# Patient Record
Sex: Female | Born: 1960
Health system: Southern US, Community
[De-identification: ages and names within clinical notes are randomized; demographics above are authoritative.]

## PROBLEM LIST (undated history)

## (undated) DIAGNOSIS — E042 Nontoxic multinodular goiter: Secondary | ICD-10-CM

## (undated) DIAGNOSIS — E785 Hyperlipidemia, unspecified: Secondary | ICD-10-CM

## (undated) DIAGNOSIS — G4700 Insomnia, unspecified: Secondary | ICD-10-CM

## (undated) DIAGNOSIS — F329 Major depressive disorder, single episode, unspecified: Secondary | ICD-10-CM

## (undated) DIAGNOSIS — F419 Anxiety disorder, unspecified: Secondary | ICD-10-CM

## (undated) DIAGNOSIS — B009 Herpesviral infection, unspecified: Secondary | ICD-10-CM

## (undated) DIAGNOSIS — F32A Depression, unspecified: Secondary | ICD-10-CM

## (undated) DIAGNOSIS — G4733 Obstructive sleep apnea (adult) (pediatric): Secondary | ICD-10-CM

## (undated) DIAGNOSIS — J40 Bronchitis, not specified as acute or chronic: Secondary | ICD-10-CM

## (undated) DIAGNOSIS — J45909 Unspecified asthma, uncomplicated: Secondary | ICD-10-CM

## (undated) DIAGNOSIS — M7710 Lateral epicondylitis, unspecified elbow: Secondary | ICD-10-CM

## (undated) DIAGNOSIS — M7060 Trochanteric bursitis, unspecified hip: Secondary | ICD-10-CM

## (undated) DIAGNOSIS — N898 Other specified noninflammatory disorders of vagina: Secondary | ICD-10-CM

## (undated) HISTORY — DX: Major depressive disorder, single episode, unspecified: F32.9

## (undated) HISTORY — DX: Insomnia, unspecified: G47.00

## (undated) HISTORY — DX: Nontoxic multinodular goiter: E04.2

## (undated) HISTORY — DX: Depression, unspecified: F32.A

## (undated) HISTORY — DX: Hyperlipidemia, unspecified: E78.5

## (undated) HISTORY — PX: HYSTEROTOMY: SHX1776

## (undated) HISTORY — DX: Trochanteric bursitis, unspecified hip: M70.60

## (undated) HISTORY — DX: Anxiety disorder, unspecified: F41.9

## (undated) HISTORY — DX: Unspecified asthma, uncomplicated: J45.909

## (undated) HISTORY — PX: KNEE SURGERY: SHX244

## (undated) HISTORY — DX: Bronchitis, not specified as acute or chronic: J40

## (undated) HISTORY — DX: Lateral epicondylitis, unspecified elbow: M77.10

## (undated) HISTORY — DX: Obstructive sleep apnea (adult) (pediatric): G47.33

## (undated) HISTORY — PX: HERNIA REPAIR: SHX51

## (undated) HISTORY — DX: Herpesviral infection, unspecified: B00.9

## (undated) HISTORY — PX: DILATION AND CURETTAGE OF UTERUS: SHX78

## (undated) HISTORY — DX: Other specified noninflammatory disorders of vagina: N89.8

## (undated) HISTORY — PX: CRYOTHERAPY: SHX1416

---

## 1997-11-04 ENCOUNTER — Other Ambulatory Visit: Admission: RE | Admit: 1997-11-04 | Discharge: 1997-11-04 | Payer: Self-pay | Admitting: Obstetrics and Gynecology

## 1997-11-27 ENCOUNTER — Inpatient Hospital Stay (HOSPITAL_COMMUNITY): Admission: AD | Admit: 1997-11-27 | Discharge: 1997-11-29 | Payer: Self-pay | Admitting: Obstetrics and Gynecology

## 1998-12-31 ENCOUNTER — Other Ambulatory Visit: Admission: RE | Admit: 1998-12-31 | Discharge: 1998-12-31 | Payer: Self-pay | Admitting: *Deleted

## 1999-04-22 ENCOUNTER — Other Ambulatory Visit: Admission: RE | Admit: 1999-04-22 | Discharge: 1999-04-22 | Payer: Self-pay | Admitting: Obstetrics and Gynecology

## 1999-12-24 ENCOUNTER — Other Ambulatory Visit: Admission: RE | Admit: 1999-12-24 | Discharge: 1999-12-24 | Payer: Self-pay | Admitting: Obstetrics and Gynecology

## 2000-12-07 ENCOUNTER — Encounter: Admission: RE | Admit: 2000-12-07 | Discharge: 2001-03-07 | Payer: Self-pay | Admitting: Family Medicine

## 2002-04-02 ENCOUNTER — Other Ambulatory Visit: Admission: RE | Admit: 2002-04-02 | Discharge: 2002-04-02 | Payer: Self-pay | Admitting: Family Medicine

## 2003-04-12 ENCOUNTER — Other Ambulatory Visit: Admission: RE | Admit: 2003-04-12 | Discharge: 2003-04-12 | Payer: Self-pay | Admitting: Family Medicine

## 2004-06-10 ENCOUNTER — Other Ambulatory Visit: Admission: RE | Admit: 2004-06-10 | Discharge: 2004-06-10 | Payer: Self-pay | Admitting: Family Medicine

## 2005-07-16 ENCOUNTER — Other Ambulatory Visit: Admission: RE | Admit: 2005-07-16 | Discharge: 2005-07-16 | Payer: Self-pay | Admitting: Family Medicine

## 2006-07-05 HISTORY — PX: COMBINED HYSTERECTOMY VAGINAL / OOPHORECTOMY / A&P REPAIR: SUR294

## 2006-09-21 ENCOUNTER — Encounter (INDEPENDENT_AMBULATORY_CARE_PROVIDER_SITE_OTHER): Payer: Self-pay | Admitting: Specialist

## 2006-09-21 ENCOUNTER — Inpatient Hospital Stay (HOSPITAL_COMMUNITY): Admission: RE | Admit: 2006-09-21 | Discharge: 2006-09-22 | Payer: Self-pay | Admitting: Obstetrics and Gynecology

## 2008-08-06 ENCOUNTER — Other Ambulatory Visit: Admission: RE | Admit: 2008-08-06 | Discharge: 2008-08-06 | Payer: Self-pay | Admitting: Family Medicine

## 2009-11-14 ENCOUNTER — Encounter: Admission: RE | Admit: 2009-11-14 | Discharge: 2009-11-14 | Payer: Self-pay | Admitting: Family Medicine

## 2010-01-27 ENCOUNTER — Other Ambulatory Visit: Admission: RE | Admit: 2010-01-27 | Discharge: 2010-01-27 | Payer: Self-pay | Admitting: Interventional Radiology

## 2010-01-27 ENCOUNTER — Encounter: Admission: RE | Admit: 2010-01-27 | Discharge: 2010-01-27 | Payer: Self-pay | Admitting: Internal Medicine

## 2010-11-20 NOTE — H&P (Signed)
NAMECAROLANNE, MERCIER                  ACCOUNT NO.:  [id number]   MEDICAL RECORD NO.:  [id number]          PATIENT TYPE:  AMB   LOCATION:  SDC                           [facility]:  [facility]   PHYSICIAN:  Dois Davenport A. Rivard, M.D. DATE OF BIRTH:  [birth year]   DATE OF ADMISSION:  DATE OF DISCHARGE:                              HISTORY & PHYSICAL   HISTORY OF PRESENT ILLNESS:  Debra Hardin is a 50 year old married white  female para 2-0-3-2 who presents for a total vaginal hysterectomy with  anterior/posterior repair because of dysfunctional uterine bleeding and  symptomatic pelvic relaxation.  For several years the patient reports  heavy, irregular menstrual periods accompanied by clots.  More recently,  she bled for 28 days though she only required one pad daily.  There have  been times, however, in the past 6 months where she has soiled her bed  linen even while wearing two tampons plus a pad.  She denies severe  cramping, nausea, vomiting, diarrhea, urinary tract symptoms, or  vaginitis symptoms.  She does admit to urinary incontinence with and  without coughing or sneezing.  Additionally, the patient has noticed a  bulge from her vaginal area during her bathroom visits.  The patient has  tried combination as well as progestin-only oral contraceptives to  manage her bleeding; however, neither have been successful and the  combination therapy worsened her migraines.  An endometrial biopsy in  February 2008 showed benign proliferative endometrium without  hyperplasia or malignancy.  Her CBC, TSH, and prolactin were within  normal limits, and her FSH was nonmenopausal.  A pelvic ultrasound  February 2008 revealed a uterus measuring 8.9 cm x 5.4 cm x 5.7 cm with  a 1.9-cm anterior fibroid and normal-appearing ovaries.  In light of her  failure of hormonal therapy and the protracted nature of her symptoms,  the patient has decided to proceed with surgical management in the form  of hysterectomy and  anterior/posterior repair.   PAST MEDICAL HISTORY:  OB history:  Gravida 5, para 2-0-3-2.  The  patient does have a history of infertility.   GYN history:  Menarche 50 years old.  Last menstrual period February  2008 (see history of present illness).  The patient uses vasectomy as  her method of contraception.  She has a history of cervical dysplasia  which was treated with cryotherapy in 2000.  She does have a history of  herpes simplex virus #2.  Her last normal Pap smear was January 2007.  Last normal mammogram was September 2007.   Medical history:  Asthma, pyelonephritis, neck injury, anemia,  arthritis, migraines, and depression.   Surgical history:  In 1967, hernia repair.  In 1979, pregnancy term.  In  1987, right knee surgery.  In 1997, D&C.   FAMILY HISTORY:  Cardiovascular disease, hypertension, thrombophlebitis,  stroke.   HABITS:  She does not use tobacco and she uses alcohol occasionally.   SOCIAL HISTORY:  The patient is married and she works as a Veterinary surgeon for  [employer].   CURRENT MEDICATIONS:  1. Singulair 10 mg  daily.  2. Zyrtec 10 mg daily.  3. Lexapro 20 mg daily.  4. Limbrel one tablet daily.  5. Xanax 0.25 mg at bedtime as needed.  6. ProAir two puffs as needed.  7. Advair 100/50 two puffs as needed.  8. Valtrex 500 mg twice daily for 3 days as needed.   The patient has a sensitivity to CODEINE - it causes severe nausea.   REVIEW OF SYSTEMS:  The patient wears glasses.  She has seasonal  allergies which contributes to coughing and sneezing.  She denies any  chest pain, vision changes, changes in her bowel habits, vaginitis  symptoms, urinary tract symptoms, and except as mentioned in history of  present illness, the patient's review of systems is otherwise negative.   PHYSICAL EXAMINATION:  VITAL SIGNS:  Blood pressure 110/60, weight is  174, height is 5 feet 7 inches.  NECK:  Supple.  There are no masses, thyromegaly, or cervical   adenopathy.  HEART:  Regular rate and rhythm.  There is no murmur.  LUNGS:  Clear.  There are no wheezes, rales, or rhonchi.  BACK:  No CVA tenderness.  ABDOMEN:  Without tenderness, masses or organomegaly.  EXTREMITIES:  Without clubbing, cyanosis, or edema.  PELVIC:  EG/BUS is normal.  Vagina reveals a 2/4 cystocele, 3/4  rectocele, a 3/4 urethrocele.  There is no enterocele and negative  stress.  Cervix is without tenderness or lesions.  Uterus is retroverted  but normal size, shape and consistency without tenderness.  Adnexa:  No  tenderness or masses.  Rectovaginal without tenderness or masses.   IMPRESSION:  1. Dysfunctional uterine bleeding.  2. Symptomatic pelvic relaxation.  3. Cystocele.  4. Rectocele.   DISPOSITION:  A discussion was held with the patient regarding the  indications for her procedures along with their risks, which include but  are not limited to reaction to anesthesia, damage to adjacent organs,  infection, excessive bleeding, and the possibility that her surgery may  require an abdominal incision.  The patient further understands that  there may be some difficulty with urination following surgery.  The  patient was given a copy of the ACOG brochure on hysterectomy.  She has  consented to proceed with a total vaginal hysterectomy with  anterior/posterior repair at [facility] on September 21, 2006, at 7:30 a.m.      Elmira J. Adline Peals.      Crist Fat Rivard, M.D.  Electronically Signed    EJP/MEDQ  D:  09/16/2006  T:  09/16/2006  Job:  [id number]

## 2010-11-20 NOTE — Discharge Summary (Signed)
Debra Hardin                  ACCOUNT NO.:  [id number]   MEDICAL RECORD NO.:  [id number]          PATIENT TYPE:  INP   LOCATION:  9307                          FACILITY:  [facility]   PHYSICIAN:  Crist Fat. Rivard, M.D. DATE OF BIRTH:  May 09, 1961   DATE OF ADMISSION:  09/21/2006  DATE OF DISCHARGE:  09/22/2006                               DISCHARGE SUMMARY   DISCHARGE DIAGNOSES:  1. Dysfunctional uterine bleeding.  2. Symptomatic pelvic relaxation.  3. Cystocele.  4. Rectocele.  5. Right ovarian cyst.   OPERATION:  On the day of admission the patient underwent a total  vaginal hysterectomy with anterior posterior repair and drainage of a  right ovarian cyst, tolerating procedure well.   HISTORY OF PRESENT ILLNESS:  Debra Hardin is a 50 year old married white  female para 2-0-3-2 who presents for total vaginal hysterectomy with  anterior-posterior repair because of dysfunctional uterine bleeding and  symptomatic pelvic relaxation.  Please see the patient's dictated  history and physical examination for details.   PREOPERATIVE PHYSICAL EXAM:  VITAL SIGNS:  Blood pressure 110/60, weight  is 174, height is 5 feet 7 inches tall  GENERAL:  Exam is within normal limits.  PELVIC:  EG BUS is within normal limits.  Vagina reveals a 2/4  cystocele, 3/4 rectocele, and 3/4 urethrocele. There is no enterocele  and negative stress. Cervix is without tenderness or lesions.  Uterus is  retroverted but normal size, shape and consistency without tenderness.  Adnexa no tenderness or masses.  Rectovaginal without tenderness or  masses.   HOSPITAL COURSE:  On the date of admission the patient underwent  aforementioned procedure tolerating it well.  (Uterus and cervix,  weighed 132.6 grams).  Postoperative course was unremarkable with  patient resuming bowel and bladder function by postop day #1 with  minimal postvoid residual and therefore deemed ready for discharge home.   DISCHARGE LABS:  CBC;  white blood count 10.3, hemoglobin 10.8  (preoperatively 12.7), hematocrit 31.8, platelets 176.  Basic metabolic  panel; sodium 136, potassium 4.0, chloride 102, CO2 30, glucose 88, BUN  11, creatinine 0.83, calcium 8.6.   DISCHARGE MEDICATIONS:  The patient was directed to her home medication  reconciliation form.  1. Vicodin 1-2 tablets every 4 hours as needed for pain.  2. Colace 100 mg up to twice daily to prevent constipation.  3. Ibuprofen 600 mg every 6 hours for 3 days then as needed for pain.  4. Lexapro 20 mg daily.  5. Xanax 1/2 to 1 tablet three times daily as needed.  6. Valtrex 1000 mg as needed.  7. Albuterol inhaler as needed.  8. Advair 100/50 as directed.  9. Singulair 10 mg daily.  10.Zyrtec 10 mg daily.   FOLLOW UP:  The patient is scheduled for 6 weeks postoperative visit  with Dr. Estanislado Pandy on October 16, 2006 at 11 o'clock a.m.   DISCHARGE INSTRUCTIONS:  The patient was given a copy of [facility] postoperative instruction sheet.  She was further  advised to avoid driving for 2 weeks, heavy lifting for 4 weeks,  intercourse for 6 weeks, that she may walk up steps, that she may  shower.  She should increase her activities slowly and diet is without  restriction though the patient was encouraged to increase her protein  and vitamin C intake.   FINAL PATHOLOGY:  Uterus hysterectomy:  Benign cervix with squamous  metaplasia, benign proliferative endometrium, no hyperplasia identified,  adenomyosis, and unremarkable serosa.      Elmira J. Adline Peals.      Crist Fat Rivard, M.D.  Electronically Signed    EJP/MEDQ  D:  10/18/2006  T:  10/18/2006  Job:  [id number]

## 2010-11-20 NOTE — Op Note (Signed)
Debra Hardin, Debra Hardin                  ACCOUNT NO.:  [id number]   MEDICAL RECORD NO.:  [id number]          PATIENT TYPE:  AMB   LOCATION:  SDC                           [facility]  [facility]   PHYSICIAN:  Dois Davenport A. Hardin, M.D. DATE OF BIRTH:  [birth year]   DATE OF PROCEDURE:  09/21/2006  DATE OF DISCHARGE:                               OPERATIVE REPORT   PREOPERATIVE DIAGNOSIS:  Dysfunctional uterine bleeding with symptomatic  pelvic relaxation.   POSTOPERATIVE DIAGNOSIS:  Dysfunctional uterine bleeding with  symptomatic pelvic relaxation, with small right ovarian cyst.   ANESTHESIA:  General.   PROCEDURE:  Total vaginal hysterectomy with anterior and posterior  repair and drainage of right ovarian cyst.   SURGEON:  Dr. Estanislado Pandy.   ASSISTANT:  Henreitta Leber, physician's assistance.   ESTIMATED BLOOD LOSS:  100 mL.   PROCEDURE:  After being informed of the planned procedure with possible  complications including bleeding, infection, injury to bowel, bladder or  ureters and need for laparotomy, informed consent is obtained.  The  patient is taken to OR #8, given general anesthesia with endotracheal  intubation without complication.  She is placed in the lithotomy  position, prepped and draped in a sterile fashion, and a Foley catheter  is inserted in her bladder.  Pelvic exam reveals a retroverted normal-  sized uterus, 2 normal adnexa, a cystocele 2/4, rectocele 3/4.  A  weighted speculum is inserted.  Anterior lip of the cervix is grasped  with a Jacobs forceps, and we infiltrate the vaginal mucosa in a  circumferential way around the cervix using lidocaine 1% epinephrine  1:200.  This allows [place] to open the vaginal mucosa in a circular way and  to bluntly dissect the posterior and anterior cul-de-sac.  Posterior cul-  de-sac is identified and opened with scissors, and retractors are placed  in the posterior cul-de-sac.  This allows [place] to isolate uterosacral  ligaments on each side with  Rogers forceps, section those ligaments and  suture them with a transfix suture of 0 Vicryl.  Anterior cul-de-sac is  then identified.  Peritoneum is opened sharply which allows [place] to fully  retract bladder anteriorly.  We can now isolate and clamp uterine  arteries on both sides with Rogers forceps, section those pedicles and  suture them with 0 Vicryl.  The uterus is then delivered via the  posterior cul-de-sac, fundus is grasped, and this allows [place] to isolate  the final pedicle on each side containing broad ligament, round  ligament, tubo-ovarian ligament and tubes.  This was done with 2 Rogers  forceps on each side.  Pedicles are sectioned.  Uterus is removed  entirely.  Both pedicles are doubly sutured with transfix suture of 0  Vicryl kept for future suspension.  Hemostasis is verified and completed  on the right uterosacral ligament with a figure-of-eight stitch of 0  Vicryl.  Both ovaries are identified, visualized and the right ovary has  a small 1.5-cm ovarian cyst which is drained after puncturing it with  cautery.  Unfortunately, this leads to bleeding, which requires 2 figure-  of-eight stitch of 2-0 Vicryl as well as cauterization.  We wait for a  few minutes to verify hemostasis which is now adequate.  A suspension  suture as well as a culdoplasty suture is placed with a 0 Vicryl  reuniting posterior cul-de-sac, posterior peritoneum. left uterosacral,  posterior peritoneum, right uterosacral, posterior cul-de-sac.  Using  the previously placed uterosacral suture and a free needle, this stitch  is used to tie each of the vaginal angle.  Hemostasis is rechecked and  adequate.  The perineum is closed with a figure-of-eight stitch of 0  Vicryl, and we can now proceed with anterior repair.  The anterior  vaginal mucosa is grasped with Allis forceps and infiltrated with  lidocaine 1% epinephrine 1:200 in the midline on its midline route.  We  can then incise the anterior vaginal  mucosa anteriorly, and then we  sharply and bluntly dissect the prevesical fascia away from the anterior  vaginal wall.  This allows [place] to completely correct the cystocele.  Using 2-0 Vicryl, we can then place a Kelly-Kennedy suspension stitch  underneath the urethrovesical junction which corrects the urethrocele,  and then we plicate in two layers using U stitches of 2-0 Vicryl the  cystocele until full correction.  The excess anterior vaginal mucosa is  then excised, and this mucosa is closed with figure-of-eight stitches of  3-0 Vicryl.  We then closed the vaginal vault using figure-of-eight  stitches of 0 Vicryl, and we then tied our suspension culdoplasty  stitch.  Retractors are removed, and posterior perineal skin is grasped  with Allis forceps on the distance of 1.5 cm.  Perineum is infiltrated  with lidocaine 1% epinephrine 1:200, and posterior vaginal mucosa is  infiltrated using the same solution.  We identify the apex of where we  want to stop our posterior repair, and then we excise a loop of perineal  skin between our 2 Allis forceps.  This allows [place] to undermine the  posterior vaginal mucosa away from the prerectal fascia in a midline  fashion and then to sharply and bluntly dissect the rectum away from the  posterior vaginal mucosa.  We can then correct the rectocele using U  stitches of 2-0 Vicryl in 3 planes until complete correction.  Excess of  posterior vaginal mucosa is excised, and we closed that mucosa with a  running lock suture of 3-0 Vicryl.  Perineal muscles are reapproximated  with simple sutures of 3-0 Vicryl, and perineal skin is closed with  subcuticular suture of 3-0 Vicryl.   This gives [place] a complete correction of the cystocele, rectocele and  urethrocele and a very good remaining vaginal length.  The vagina is  then packed with a vaginal packing and Estrace cream.  Instruments and sponge count is complete x2.  Estimated blood loss is 100 mL.  The   procedure is very well tolerated by the patient who is taken to recovery  room in a well and stable condition.      Debra Hardin, M.D.  Electronically Signed     SAR/MEDQ  D:  09/21/2006  T:  09/21/2006  Job:  [id number]

## 2012-04-26 ENCOUNTER — Encounter: Payer: Self-pay | Admitting: Obstetrics and Gynecology

## 2012-04-26 ENCOUNTER — Ambulatory Visit (INDEPENDENT_AMBULATORY_CARE_PROVIDER_SITE_OTHER): Payer: 59 | Admitting: Obstetrics and Gynecology

## 2012-04-26 VITALS — BP 110/70 | Wt 173.0 lb

## 2012-04-26 DIAGNOSIS — R159 Full incontinence of feces: Secondary | ICD-10-CM

## 2012-04-26 DIAGNOSIS — N3090 Cystitis, unspecified without hematuria: Secondary | ICD-10-CM

## 2012-04-26 LAB — POCT URINALYSIS DIPSTICK
Bilirubin, UA: NEGATIVE
Ketones, UA: NEGATIVE
Spec Grav, UA: <=1.005
Urobilinogen, UA: NEGATIVE
pH, UA: 8

## 2012-04-26 NOTE — Progress Notes (Signed)
Subjective:    Debra Hardin is a 51 y.o. female, [id number], who presents for lose stool after using the restroom and keep wiping all day  c/o bladder issues . S/P TVH with AP repair 09/2006  Mammogram: had one 04/24/12 Colonoscopy: 2013 with Dr Loreta Ave, Normal  The following portions of the patient's history were reviewed and updated as appropriate: allergies, current medications, past family history.  Review of Systems Pertinent items are noted in HPI. Breast:Negative for breast lump,nipple discharge or nipple retraction Gastrointestinal: Bm daily, has to wipe over and over and still has stools in underwear 2-3 x a week, normal texture stool. Problem is ongoing for about 1 year and progressively worsening. Hemorrhoids + with messier when out. Urinary: USI if bladder is full with sneezing and laughing, normal frequency, no urgency, complete emptying, no nycturia, no dysuria, no hematuria   Objective:    BP 110/70  Wt 173 lb (78.472 kg)    Weight:  Wt Readings from Last 1 Encounters:  04/26/12 173 lb (78.472 kg)          BMI: There is no height on file to calculate BMI.  General Appearance: Alert, appropriate appearance for age. No acute distress HEENT: Grossly normal Neck / Thyroid: Supple, no masses, nodes or enlargement Lungs: clear to auscultation bilaterally Back: No CVA tenderness Cardiovascular: Regular rate and rhythm. S1, S2, no murmur Gastrointestinal: Soft, non-tender, no masses or organomegaly Pelvic Exam: Vulva and vagina appear normal. No cystocele or rectocele. Uretrocele 2/4. Rectovaginal: normal rectal, no masses. Good rectal sphincter tone No rectocele, but significant skin tag from hemorrhoid.    Assessment:    Stool incontinence 2nd to rectal skin tag?    Plan:    Add fiber daily to bulk up stools. If no improvement in 6 weeks, refer to surgery for removal of rectal skin tag +/- hemorrhoid surgery  Fiber supplement, daily. Benefiber recommended. Skin tag  removal, and possible hemorrhoid removal/"lift". Not necessary to address bladder issue at this time.  Silverio Lay MD

## 2013-07-23 ENCOUNTER — Telehealth: Payer: Self-pay | Admitting: *Deleted

## 2013-07-24 NOTE — Telephone Encounter (Signed)
LVM message to pt return call regarding appt change

## 2013-07-25 ENCOUNTER — Ambulatory Visit: Payer: Self-pay | Admitting: Internal Medicine

## 2013-07-25 ENCOUNTER — Encounter (INDEPENDENT_AMBULATORY_CARE_PROVIDER_SITE_OTHER): Payer: Self-pay

## 2013-07-25 ENCOUNTER — Encounter: Payer: Self-pay | Admitting: Internal Medicine

## 2013-07-25 ENCOUNTER — Ambulatory Visit (INDEPENDENT_AMBULATORY_CARE_PROVIDER_SITE_OTHER): Payer: Commercial Managed Care - PPO | Admitting: Internal Medicine

## 2013-07-25 VITALS — BP 120/77 | HR 72 | Temp 98.0°F | Resp 18 | Ht 66.5 in | Wt 183.0 lb

## 2013-07-25 DIAGNOSIS — M1990 Unspecified osteoarthritis, unspecified site: Secondary | ICD-10-CM

## 2013-07-25 DIAGNOSIS — J45909 Unspecified asthma, uncomplicated: Secondary | ICD-10-CM

## 2013-07-25 DIAGNOSIS — F32A Depression, unspecified: Secondary | ICD-10-CM

## 2013-07-25 DIAGNOSIS — R32 Unspecified urinary incontinence: Secondary | ICD-10-CM

## 2013-07-25 DIAGNOSIS — G4730 Sleep apnea, unspecified: Secondary | ICD-10-CM

## 2013-07-25 DIAGNOSIS — M7070 Other bursitis of hip, unspecified hip: Secondary | ICD-10-CM

## 2013-07-25 DIAGNOSIS — E042 Nontoxic multinodular goiter: Secondary | ICD-10-CM | POA: Insufficient documentation

## 2013-07-25 DIAGNOSIS — M25552 Pain in left hip: Secondary | ICD-10-CM | POA: Insufficient documentation

## 2013-07-25 DIAGNOSIS — F411 Generalized anxiety disorder: Secondary | ICD-10-CM

## 2013-07-25 DIAGNOSIS — R42 Dizziness and giddiness: Secondary | ICD-10-CM

## 2013-07-25 DIAGNOSIS — E785 Hyperlipidemia, unspecified: Secondary | ICD-10-CM | POA: Insufficient documentation

## 2013-07-25 DIAGNOSIS — F3289 Other specified depressive episodes: Secondary | ICD-10-CM

## 2013-07-25 DIAGNOSIS — M76899 Other specified enthesopathies of unspecified lower limb, excluding foot: Secondary | ICD-10-CM

## 2013-07-25 DIAGNOSIS — F329 Major depressive disorder, single episode, unspecified: Secondary | ICD-10-CM

## 2013-07-25 DIAGNOSIS — A6000 Herpesviral infection of urogenital system, unspecified: Secondary | ICD-10-CM

## 2013-07-25 DIAGNOSIS — Z90710 Acquired absence of both cervix and uterus: Secondary | ICD-10-CM

## 2013-07-25 DIAGNOSIS — F419 Anxiety disorder, unspecified: Secondary | ICD-10-CM

## 2013-07-25 DIAGNOSIS — J309 Allergic rhinitis, unspecified: Secondary | ICD-10-CM

## 2013-07-25 LAB — CBC WITH DIFFERENTIAL/PLATELET
BASOS ABS: 0 10*3/uL (ref 0.0–0.1)
Basophils Relative: 0 % (ref 0–1)
EOS ABS: 0 10*3/uL (ref 0.0–0.7)
EOS PCT: 0 % (ref 0–5)
HEMATOCRIT: 39.9 % (ref 36.0–46.0)
HEMOGLOBIN: 13.6 g/dL (ref 12.0–15.0)
LYMPHS ABS: 1.3 10*3/uL (ref 0.7–4.0)
Lymphocytes Relative: 24 % (ref 12–46)
MCH: 30.2 pg (ref 26.0–34.0)
MCHC: 34.1 g/dL (ref 30.0–36.0)
MCV: 88.7 fL (ref 78.0–100.0)
MONO ABS: 0.4 10*3/uL (ref 0.1–1.0)
MONOS PCT: 8 % (ref 3–12)
Neutro Abs: 3.7 10*3/uL (ref 1.7–7.7)
Neutrophils Relative %: 68 % (ref 43–77)
PLATELETS: 246 10*3/uL (ref 150–400)
RBC: 4.5 MIL/uL (ref 3.87–5.11)
RDW: 13.3 % (ref 11.5–15.5)
WBC: 5.5 10*3/uL (ref 4.0–10.5)

## 2013-07-25 MED ORDER — MOMETASONE FURO-FORMOTEROL FUM 100-5 MCG/ACT IN AERO: INHALATION_SPRAY | RESPIRATORY_TRACT | Status: DC

## 2013-07-25 NOTE — Patient Instructions (Signed)
Activate my chart  Stop  Qvar and take Dulera 2 inhalations bid  Get labs today  Schedule 3D mammogram and bone density    Schedule completee physical

## 2013-07-25 NOTE — Progress Notes (Signed)
Subjective:    Patient ID: Debra Hardin, female    DOB: Nov 27, 1960, 53 y.o.   MRN: [id number]  HPI  Debra Hardin is here for first visit for primary care.  PMH of  Asthma, allergic rhinitis,  depression with anxiety, DJD and hip bursitis, hyperlipidemia on Pravastatin,  Urinary incontinence,  MNG, genital herpes.   Debra Hardin is slightly behind on health maintenance as she had high insurance rates in the past.  Needs to catch up  She currently has problems with insomnia.  Wakes up at night thinking about work and hard to get back to sleep.  She has had  Multiple SSRI's and wants to try to get off meds.  Uses XanaX PRN  Still coughing with prolonged bronchitis  Recently finished  Avelox.  Using qvar and ventolin.   Not using ventolin daily.  No night time symptoms.    Allergies  Allergen Reactions  . Codeine    Past Medical History  Diagnosis Date  . Asthma   . HSV-2 (herpes simplex virus 2) infection    Past Surgical History  Procedure Laterality Date  . Cryotherapy    . Hernia repair    . Knee surgery    . Hysterotomy    . Combined hysterectomy vaginal / oophorectomy / a&p repair     History   Social History  . Marital Status: Married    Spouse Name: N/A    Number of Children: N/A  . Years of Education: N/A   Occupational History  . Not on file.   Social History Main Topics  . Smoking status: Never Smoker   . Smokeless tobacco: Never Used  . Alcohol Use: No  . Drug Use: No  . Sexual Activity: Yes    Birth Control/ Protection: Surgical     Comment: hysterectomy    Other Topics Concern  . Not on file   Social History Narrative  . No narrative on file   Family History  Problem Relation Age of Onset  . Bone cancer Paternal Grandmother   . Stroke Maternal Grandmother   . Heart attack Maternal Grandfather   . Stomach cancer Maternal Grandfather   . Heart attack Father 23  . Heart disease Mother   . Colon cancer Maternal Uncle   . Bone cancer Maternal Aunt     There are no active problems to display for this patient.  Current Outpatient Prescriptions on File Prior to Visit  Medication Sig Dispense Refill  . Albuterol (VENTOLIN IN) Inhale into the lungs.      . busPIRone (BUSPAR) 15 MG tablet Take 15 mg by mouth 2 (two) times daily.      Marland Kitchen Desvenlafaxine Succinate (PRISTIQ PO) Take by mouth.      . Fluticasone Propionate (FLONASE NA) Place into the nose.      Marland Kitchen Fluticasone-Salmeterol (ADVAIR DISKUS IN) Inhale into the lungs.      . traZODone (DESYREL) 150 MG tablet Take 150 mg by mouth at bedtime.      . valACYclovir (VALTREX) 500 MG tablet Take 500 mg by mouth 2 (two) times daily.       No current facility-administered medications on file prior to visit.     Review of Systems    see HPI Objective:   Physical Exam Physical Exam  Nursing note and vitals reviewed.  Constitutional: She is oriented to person, place, and time. She appears well-developed and well-nourished.  HENT:  Head: Normocephalic and atraumatic.  Cardiovascular: Normal rate and regular  rhythm. Exam reveals no gallop and no friction rub.  No murmur heard.  Pulmonary/Chest: Breath sounds normal. She has no wheezes. She has no rales.  Neurological: She is alert and oriented to person, place, and time.  Skin: Skin is warm and dry.  Psychiatric: She has a normal mood and affect. Her behavior is normal.        Assessment & Plan:  Hyperlipidemia  willl check today  Asthma/allergic rhinitis:  wlll change to [facility] and stop Qvar.  Advised if using ventolin 3 times or more in 24 hours to see me in office  MNG  Had neg bx in past  Will need thyroid ultrasound at some point  Anxiety/depressio/insomnia  Wishes to disucss further with Gala Murdoch NP with Dr. Caprice Beaver  DJD She does see a chiropracter for this.  HIgh risk osteoporosis  Frequent steroid use.  willl get DEXA with her mm  Vertigo  Genital herpes   Sleep apnea  Has CPAP but not using  Schedule CPE   Will get labs pt toschedule mm

## 2013-07-26 LAB — LIPID PANEL
CHOLESTEROL: 181 mg/dL (ref 0–200)
HDL: 65 mg/dL (ref >39)
LDL CALC: 102 mg/dL — AB (ref 0–99)
Total CHOL/HDL Ratio: 2.8 Ratio
Triglycerides: 68 mg/dL (ref <150)
VLDL: 14 mg/dL (ref 0–40)

## 2013-07-26 LAB — TSH: TSH: 0.82 u[IU]/mL (ref 0.350–4.500)

## 2013-07-26 LAB — FOLLICLE STIMULATING HORMONE: FSH: 59.6 m[IU]/mL

## 2013-07-26 LAB — VITAMIN D 25 HYDROXY (VIT D DEFICIENCY, FRACTURES): VIT D 25 HYDROXY: 40 ng/mL (ref 30–89)

## 2013-08-22 ENCOUNTER — Telehealth: Payer: Self-pay | Admitting: *Deleted

## 2013-08-22 NOTE — Telephone Encounter (Signed)
Refill request labs drawn 07/25/13

## 2013-08-23 ENCOUNTER — Other Ambulatory Visit: Payer: Self-pay | Admitting: *Deleted

## 2013-08-23 NOTE — Telephone Encounter (Signed)
Re-route as a refill request

## 2013-08-27 ENCOUNTER — Other Ambulatory Visit: Payer: Self-pay | Admitting: *Deleted

## 2013-09-10 ENCOUNTER — Other Ambulatory Visit: Payer: Self-pay | Admitting: *Deleted

## 2013-09-10 ENCOUNTER — Telehealth: Payer: Self-pay | Admitting: *Deleted

## 2013-09-10 MED ORDER — PRAVASTATIN SODIUM 40 MG PO TABS: 40.0000 mg | ORAL_TABLET | Freq: Every day | ORAL | Status: DC

## 2013-09-10 NOTE — Telephone Encounter (Signed)
Refill request

## 2013-09-10 NOTE — Telephone Encounter (Signed)
Needs refill of pravastatin  Called into [facility] on [address]

## 2013-10-18 ENCOUNTER — Encounter: Payer: Commercial Managed Care - PPO | Admitting: Internal Medicine

## 2013-10-24 ENCOUNTER — Ambulatory Visit (INDEPENDENT_AMBULATORY_CARE_PROVIDER_SITE_OTHER): Payer: Commercial Managed Care - PPO | Admitting: Internal Medicine

## 2013-10-24 ENCOUNTER — Encounter: Payer: Self-pay | Admitting: Internal Medicine

## 2013-10-24 VITALS — BP 124/76 | HR 89 | Temp 98.1°F | Resp 18 | Ht 66.5 in | Wt 189.0 lb

## 2013-10-24 DIAGNOSIS — Z0000 Encounter for general adult medical examination without abnormal findings: Secondary | ICD-10-CM

## 2013-10-24 DIAGNOSIS — J309 Allergic rhinitis, unspecified: Secondary | ICD-10-CM

## 2013-10-24 DIAGNOSIS — Z139 Encounter for screening, unspecified: Secondary | ICD-10-CM

## 2013-10-24 DIAGNOSIS — E785 Hyperlipidemia, unspecified: Secondary | ICD-10-CM

## 2013-10-24 DIAGNOSIS — E049 Nontoxic goiter, unspecified: Secondary | ICD-10-CM

## 2013-10-24 DIAGNOSIS — Z1151 Encounter for screening for human papillomavirus (HPV): Secondary | ICD-10-CM

## 2013-10-24 DIAGNOSIS — J45909 Unspecified asthma, uncomplicated: Secondary | ICD-10-CM

## 2013-10-24 DIAGNOSIS — Z124 Encounter for screening for malignant neoplasm of cervix: Secondary | ICD-10-CM

## 2013-10-24 DIAGNOSIS — E042 Nontoxic multinodular goiter: Secondary | ICD-10-CM

## 2013-10-24 DIAGNOSIS — N951 Menopausal and female climacteric states: Secondary | ICD-10-CM

## 2013-10-24 LAB — POCT URINALYSIS DIPSTICK
Bilirubin, UA: NEGATIVE
GLUCOSE UA: NEGATIVE
Ketones, UA: NEGATIVE
Leukocytes, UA: NEGATIVE
NITRITE UA: NEGATIVE
Protein, UA: NEGATIVE
RBC UA: NEGATIVE
SPEC GRAV UA: 1.02
UROBILINOGEN UA: NEGATIVE
pH, UA: 6.5

## 2013-10-24 LAB — HEMOCCULT GUIAC POC 1CARD (OFFICE): Fecal Occult Blood, POC: NEGATIVE

## 2013-10-24 NOTE — Patient Instructions (Signed)
Will make appt with Dr. Josefa Half  BE sure to reschedule your mammogram

## 2013-10-28 DIAGNOSIS — N951 Menopausal and female climacteric states: Secondary | ICD-10-CM | POA: Insufficient documentation

## 2013-10-28 NOTE — Progress Notes (Signed)
Subjective:    Patient ID: Debra Hardin, female    DOB: 18-Jan-1961, 53 y.o.   MRN: [id number]  HPI Zuha is here for CPE  Asthma  Doing much better with change to [facility].  She tells me she did see Dr. Greta Doom for her chronic cough ,  CXR taken showed bronchitis and she was given antibiotic.   Cough now resolved.  Has not needed rescue inhaler in several weeks.  She is using Dulera daily.    She does report increasing daily frequency of hot flushes.  Waking her frequently at night.  She is S/P hysterectomy for endometriosis and fibroids.  She would like a pap of vaginal cuff today.  No persosnal or FH of breast or GYN cancer.  No personal of FH of DVT or PE.  NO personal history of MI or CVA.   She had repair of pelvic prolapse with her hysterectomy and still has som pain with penetration.  She had seen someone with Integrative therapies in the past with partial success.  She does report lots of vaginal dryness    Roselia does report  Lots of pain in second digit of R foot.  Pain located at tip of toe.  No injury or trauma  MNG  Has not seen endocrinologist in quite some time.    She did have thyroid biopsy in 2011 left nodule,  Non neoplastic goiter with cystic degeneration     Review of Systems  Respiratory: Negative for cough, chest tightness, shortness of breath and wheezing.   Cardiovascular: Negative for chest pain, palpitations and leg swelling.  Gastrointestinal: Negative for abdominal pain.  All other systems reviewed and are negative.      Objective:   Physical Exam  Physical Exam  Vital signs and nursing note reviewed   Peak flow 420 Constitutional: She is oriented to person, place, and time. She appears well-developed and well-nourished. She is cooperative.  HENT:  Head: Normocephalic and atraumatic.  Right Ear: Tympanic membrane normal.  Left Ear: Tympanic membrane normal.  Nose: Nose normal.  Mouth/Throat: Oropharynx is clear and moist and mucous membranes are normal.  No oropharyngeal exudate or posterior oropharyngeal erythema.  Eyes: Conjunctivae and EOM are normal. Pupils are equal, round, and reactive to light.  Neck: Neck supple. No JVD present. Carotid bruit is not present. She does have easily mobile nodule of Right side of thyroid  Cardiovascular: Regular rhythm, normal heart sounds, intact distal pulses and normal pulses.  Exam reveals no gallop and no friction rub.   No murmur heard. Pulses:      Dorsalis pedis pulses are 2+ on the right side, and 2+ on the left side.  Pulmonary/Chest: Breath sounds normal. She has no wheezes. She has no rhonchi. She has no rales. Right breast exhibits no mass, no nipple discharge and no skin change. Left breast exhibits no mass, no nipple discharge and no skin change.  Abdominal: Soft. Bowel sounds are normal. She exhibits no distension and no mass. There is no hepatosplenomegaly. There is no tenderness. There is no CVA tenderness.  Genitourinary: Rectum normal, vagina normal and uterus normal. Rectal exam shows no mass. Guaiac negative stool. No labial fusion. There is no lesion on the right labia. There is no lesion on the left labia. Cervix absent. Right adnexum displays no mass, no tenderness and no fullness. Left adnexum displays no mass, no tenderness and no fullness. No erythema around the vagina.  Musculoskeletal:       No  active synovitis to any joint.    Lymphadenopathy:       Right cervical: No superficial cervical adenopathy present.      Left cervical: No superficial cervical adenopathy present.       Right axillary: No pectoral and no lateral adenopathy present.       Left axillary: No pectoral and no lateral adenopathy present.      Right: No inguinal adenopathy present.       Left: No inguinal adenopathy present.  Neurological: She is alert and oriented to person, place, and time. She has normal strength and normal reflexes. No cranial nerve deficit or sensory deficit. She displays a negative Romberg  sign. Coordination and gait normal.  Skin: Skin is warm and dry. No abrasion, no bruising, no ecchymosis and no rash noted. No cyanosis. Nails show no clubbing.  Psychiatric: She has a normal mood and affect. Her speech is normal and behavior is normal.          Assessment & Plan:   Health Maintenance pap of vaginal cuff todayl.  UTD with TDap  2013    Asthma  Peak flow  420 today  Continue Dulera dn albuterol rescue  MNG   Will schedule thyroid ultrasound  Menopausal hot flushes  Extensive education regarding risks/benefits of HT  Will get mm  And if neg pt a good candidate for short term  HT  Vaginal dryness/ dyspareunia  Will start with moisturizers and HT once mm is negative.    Foot pain  Pain localized to toe.  OK for podiatry referral  Suspect  Morton's neuroma.    Dep/anxiety:  Continue meds with psychiatry  Hyperlipidemia  Lipids good  Urge incontinence    Gental Herpes.        Assessment & Plan:

## 2013-11-02 ENCOUNTER — Encounter: Payer: Self-pay | Admitting: *Deleted

## 2013-11-09 ENCOUNTER — Telehealth: Payer: Self-pay | Admitting: *Deleted

## 2013-11-09 MED ORDER — ESTRADIOL 0.05 MG/24HR TD PTTW: MEDICATED_PATCH | TRANSDERMAL | Status: DC

## 2013-11-09 NOTE — Telephone Encounter (Signed)
Notified pt via VM message about her RX and instructions

## 2013-11-09 NOTE — Telephone Encounter (Signed)
Pt called stating that she has had her MM and that it is fine and would like to get started on hormone replacement.

## 2013-11-09 NOTE — Telephone Encounter (Signed)
  Debra Hardin  Call pt and let her know I sent a RX for a skin patch of vivelle dot  (she should get generic) and she should change patch two times a week on her skin.   Sent to [facility]  She will see an improvement in her flushing in 2-4 weeks  Keep follow up appt. With me

## 2013-11-11 ENCOUNTER — Encounter: Payer: Self-pay | Admitting: Internal Medicine

## 2013-11-16 ENCOUNTER — Encounter: Payer: Self-pay | Admitting: *Deleted

## 2013-11-26 ENCOUNTER — Telehealth: Payer: Self-pay | Admitting: Internal Medicine

## 2013-11-26 NOTE — Telephone Encounter (Signed)
Debra Hardin  Call Keith and ask about if she has an appointment for her thyroid ultrasound test to follow up on her goiter.  I do not see where and appt was made as yet.  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   Message back with her response

## 2013-11-27 NOTE — Telephone Encounter (Signed)
LVM message awaiting return call.

## 2013-12-11 ENCOUNTER — Other Ambulatory Visit: Payer: Self-pay | Admitting: Internal Medicine

## 2013-12-11 NOTE — Telephone Encounter (Signed)
Refill request

## 2014-01-23 ENCOUNTER — Encounter: Payer: Self-pay | Admitting: Internal Medicine

## 2014-01-23 ENCOUNTER — Ambulatory Visit (INDEPENDENT_AMBULATORY_CARE_PROVIDER_SITE_OTHER): Payer: Commercial Managed Care - PPO | Admitting: Internal Medicine

## 2014-01-23 VITALS — BP 118/69 | HR 75 | Resp 16 | Ht 66.0 in | Wt 194.0 lb

## 2014-01-23 DIAGNOSIS — F411 Generalized anxiety disorder: Secondary | ICD-10-CM

## 2014-01-23 DIAGNOSIS — E042 Nontoxic multinodular goiter: Secondary | ICD-10-CM

## 2014-01-23 DIAGNOSIS — F419 Anxiety disorder, unspecified: Secondary | ICD-10-CM

## 2014-01-23 DIAGNOSIS — J4520 Mild intermittent asthma, uncomplicated: Secondary | ICD-10-CM

## 2014-01-23 DIAGNOSIS — N951 Menopausal and female climacteric states: Secondary | ICD-10-CM

## 2014-01-23 DIAGNOSIS — J45909 Unspecified asthma, uncomplicated: Secondary | ICD-10-CM

## 2014-01-23 MED ORDER — ALPRAZOLAM 0.5 MG PO TABS: 0.5000 mg | ORAL_TABLET | Freq: Every evening | ORAL | Status: DC | PRN

## 2014-01-23 NOTE — Progress Notes (Signed)
Subjective:    Patient ID: Debra Hardin, female    DOB: 1961/05/16, 53 y.o.   MRN: [id number]  HPI Debra Hardin is her for follow up of several issues   Peri-menopausal hot flushes:    after initiating HT  She tells me she is 75% improved.   Much less flushing.   Sleeping better  She is S/P hysterectomy    MNG  She had complex thyroid cyst in 2011 bx consistant with MNG.  She really has not had any follow up for this since then,  I advised and ordered thyroid U/S in the spring but pt has not been able to get test as yet.  She will be leaving town next week but states she will reschedule.  She is asymptomatic.  Some weight gain  Debra Hardin tells me she is off her Buspar now and prozac reduced to 10 mg.  Her situational anxiety is worse when her youngest daughter is not doing well from an eating disorder.  Debra Hardin will be seeing a new psychiatrist DR. Braden ?? Spelling who is in practice with Dr. Caprice Beaver.    Asthma.   Much improved     Peak flow today  380.  She is using her Ruthe Mannan only once a day now  Allergies  Allergen Reactions  . Codeine    Past Medical History  Diagnosis Date  . Asthma   . HSV-2 (herpes simplex virus 2) infection   . Anxiety   . Depression   . Insomnia   . Vaginal dryness   . Bronchitis    Past Surgical History  Procedure Laterality Date  . Cryotherapy    . Hernia repair    . Knee surgery    . Hysterotomy    . Combined hysterectomy vaginal / oophorectomy / a&p repair     History   Social History  . Marital Status: Married    Spouse Name: N/A    Number of Children: N/A  . Years of Education: N/A   Occupational History  . Not on file.   Social History Main Topics  . Smoking status: Never Smoker   . Smokeless tobacco: Never Used  . Alcohol Use: No  . Drug Use: No  . Sexual Activity: Yes    Birth Control/ Protection: Surgical     Comment: hysterectomy    Other Topics Concern  . Not on file   Social History Narrative  . No narrative on file    Family History  Problem Relation Age of Onset  . Bone cancer Paternal Grandmother   . Stroke Maternal Grandmother   . Heart attack Maternal Grandfather   . Stomach cancer Maternal Grandfather   . Heart attack Father 66  . Heart disease Mother   . Depression Mother   . Colon cancer Maternal Uncle   . Depression Sister   . Depression Brother   . Mental illness Daughter    Patient Active Problem List   Diagnosis Date Noted  . Hot flushes, perimenopausal  HT began 11/2013 10/28/2013  . Multinodular goiter 07/25/2013  . Anxiety 07/25/2013  . Asthma 07/25/2013  . DJD (degenerative joint disease) 07/25/2013  . Depression 07/25/2013  . Hyperlipidemia 07/25/2013  . S/P hysterectomy 07/25/2013  . Genital herpes 07/25/2013  . Allergic rhinitis 07/25/2013  . Sleep apnea 07/25/2013  . Urinary incontinence 07/25/2013  . Vertigo 07/25/2013  . Hip bursitis 07/25/2013   Current Outpatient Prescriptions on File Prior to Visit  Medication Sig Dispense Refill  . Albuterol (  VENTOLIN IN) Inhale into the lungs.      Marland Kitchen azelastine (ASTELIN) 137 MCG/SPRAY nasal spray Place into both nostrils 2 (two) times daily. Use in each nostril as directed      . DULERA 100-5 MCG/ACT AERO INHALE TWO PUFFS BY MOUTH TWICE DAILY  1 Inhaler  1  . estradiol (VIVELLE-DOT) 0.05 MG/24HR patch Place onto skin and change twice a week.  Give generic  8 patch  6  . FLUoxetine (PROZAC) 10 MG capsule Take 10 mg by mouth daily.      . montelukast (SINGULAIR) 10 MG tablet Take 10 mg by mouth at bedtime.      . Omega-3 Fatty Acids (FISH OIL) 600 MG CAPS Take by mouth.      . traZODone (DESYREL) 150 MG tablet Take 150 mg by mouth at bedtime.      . triamcinolone (NASACORT) 55 MCG/ACT AERO nasal inhaler Place 2 sprays into the nose daily.      . TURMERIC PO Take 1 capsule by mouth daily.      . busPIRone (BUSPAR) 15 MG tablet Take 15 mg by mouth 2 (two) times daily.      . cromolyn (NASALCROM) 5.2 MG/ACT nasal spray Place 1  spray into both nostrils 4 (four) times daily.      . magnesium oxide (MAG-OX) 400 MG tablet Take 400 mg by mouth daily.      . MULTIPLE VITAMIN PO Take by mouth.      . pravastatin (PRAVACHOL) 40 MG tablet Take 1 tablet (40 mg total) by mouth daily.  30 tablet  2  . valACYclovir (VALTREX) 500 MG tablet Take 500 mg by mouth 2 (two) times daily.       No current facility-administered medications on file prior to visit.       Review of Systems See HPI    Objective:   Physical Exam Physical Exam  Nursing note and vitals reviewed.   Peak flow 380  Constitutional: She is oriented to person, place, and time. She appears well-developed and well-nourished.  HENT:  Head: Normocephalic and atraumatic.  Cardiovascular: Normal rate and regular rhythm. Exam reveals no gallop and no friction rub.  No murmur heard.  Pulmonary/Chest: Breath sounds normal. She has no wheezes. She has no rales.  Neurological: She is alert and oriented to person, place, and time.  Skin: Skin is warm and dry.  Psychiatric: She has a normal mood and affect. Her behavior is normal.              Assessment & Plan:  Vasomotor flushes  Continue  Vivelle dot twice a week  Sheis S/P Hysterectomy  MNG  Will reschedule Thyroid U/S  Pt voices understanding  Asthma:  Ok to take [facility] once a day   Situational anxiety  Has been able to reduce dose of meds.  buspar has been D/c'd   She will be starting with new psychiatrist soon

## 2014-01-23 NOTE — Patient Instructions (Signed)
Stop by xray to schedule  U/S

## 2014-03-05 DIAGNOSIS — E042 Nontoxic multinodular goiter: Secondary | ICD-10-CM

## 2014-03-05 HISTORY — DX: Nontoxic multinodular goiter: E04.2

## 2014-03-12 ENCOUNTER — Ambulatory Visit (HOSPITAL_BASED_OUTPATIENT_CLINIC_OR_DEPARTMENT_OTHER)
Admission: RE | Admit: 2014-03-12 | Discharge: 2014-03-12 | Disposition: A | Discharge status: Home / Self Care | Payer: Commercial Managed Care - PPO | Source: Ambulatory Visit | Attending: Internal Medicine | Admitting: Internal Medicine

## 2014-03-12 DIAGNOSIS — E042 Nontoxic multinodular goiter: Secondary | ICD-10-CM | POA: Diagnosis present

## 2014-03-15 ENCOUNTER — Telehealth: Payer: Self-pay | Admitting: Internal Medicine

## 2014-03-15 DIAGNOSIS — E041 Nontoxic single thyroid nodule: Secondary | ICD-10-CM

## 2014-03-15 NOTE — Telephone Encounter (Signed)
Spoke with pt and informed of thyroid ultrasound results     Will get endocrinology  opinion regarding left sided nodule .  Pt voices understanding   .

## 2014-03-19 ENCOUNTER — Other Ambulatory Visit: Payer: Self-pay | Admitting: Internal Medicine

## 2014-03-19 DIAGNOSIS — E041 Nontoxic single thyroid nodule: Secondary | ICD-10-CM

## 2014-03-19 NOTE — Progress Notes (Signed)
Called and left message for her to call me, I set her appointment up for 04/18/14 for 9:15

## 2014-03-25 NOTE — Telephone Encounter (Signed)
error 

## 2014-04-06 ENCOUNTER — Other Ambulatory Visit: Payer: Self-pay | Admitting: Internal Medicine

## 2014-04-08 ENCOUNTER — Ambulatory Visit (INDEPENDENT_AMBULATORY_CARE_PROVIDER_SITE_OTHER): Payer: Commercial Managed Care - PPO | Admitting: Internal Medicine

## 2014-04-08 ENCOUNTER — Encounter: Payer: Self-pay | Admitting: Internal Medicine

## 2014-04-08 VITALS — BP 118/71 | HR 95 | Temp 98.3°F | Resp 15 | Ht 66.0 in | Wt 186.0 lb

## 2014-04-08 DIAGNOSIS — J208 Acute bronchitis due to other specified organisms: Secondary | ICD-10-CM

## 2014-04-08 DIAGNOSIS — J449 Chronic obstructive pulmonary disease, unspecified: Secondary | ICD-10-CM

## 2014-04-08 DIAGNOSIS — J209 Acute bronchitis, unspecified: Secondary | ICD-10-CM

## 2014-04-08 DIAGNOSIS — R05 Cough: Secondary | ICD-10-CM

## 2014-04-08 DIAGNOSIS — R059 Cough, unspecified: Secondary | ICD-10-CM

## 2014-04-08 DIAGNOSIS — J4521 Mild intermittent asthma with (acute) exacerbation: Secondary | ICD-10-CM

## 2014-04-08 MED ORDER — AZITHROMYCIN 250 MG PO TABS: ORAL_TABLET | ORAL | Status: DC

## 2014-04-08 MED ORDER — METHYLPREDNISOLONE SODIUM SUCC 125 MG IJ SOLR
125.0000 mg | Freq: Once | INTRAMUSCULAR | Status: AC
Start: 2014-04-08 — End: 2014-04-08
  Administered 2014-04-08: 125 mg via INTRAMUSCULAR

## 2014-04-08 MED ORDER — BENZONATATE 100 MG PO CAPS: ORAL_CAPSULE | ORAL | Status: DC

## 2014-04-08 NOTE — Progress Notes (Signed)
Subjective:    Patient ID: Debra Hardin, female    DOB: 1960-11-28, 53 y.o.   MRN: [id number]  HPI  Debra Hardin is here for acute visit   2 week of head congestion  Nasal discharge  And now coughing green mucous.  Began with sore throat.   NO fever no chest pain no SOB.    PMH of asthma  No wheezing but is using Dulera bid and only used albuterol once since symptoms began  Allergies  Allergen Reactions  . Codeine    Past Medical History  Diagnosis Date  . Asthma   . HSV-2 (herpes simplex virus 2) infection   . Anxiety   . Depression   . Insomnia   . Vaginal dryness   . Bronchitis    Past Surgical History  Procedure Laterality Date  . Cryotherapy    . Hernia repair    . Knee surgery    . Hysterotomy    . Combined hysterectomy vaginal / oophorectomy / a&p repair     History   Social History  . Marital Status: Married    Spouse Name: N/A    Number of Children: N/A  . Years of Education: N/A   Occupational History  . Not on file.   Social History Main Topics  . Smoking status: Never Smoker   . Smokeless tobacco: Never Used  . Alcohol Use: No  . Drug Use: No  . Sexual Activity: Yes    Birth Control/ Protection: Surgical     Comment: hysterectomy    Other Topics Concern  . Not on file   Social History Narrative  . No narrative on file   Family History  Problem Relation Age of Onset  . Bone cancer Paternal Grandmother   . Stroke Maternal Grandmother   . Heart attack Maternal Grandfather   . Stomach cancer Maternal Grandfather   . Heart attack Father 73  . Heart disease Mother   . Depression Mother   . Colon cancer Maternal Uncle   . Depression Sister   . Depression Brother   . Mental illness Daughter    Patient Active Problem List   Diagnosis Date Noted  . Hot flushes, perimenopausal  HT began 11/2013 10/28/2013  . Multinodular goiter 07/25/2013  . Anxiety 07/25/2013  . Asthma 07/25/2013  . DJD (degenerative joint disease) 07/25/2013  . Depression  07/25/2013  . Hyperlipidemia 07/25/2013  . S/P hysterectomy 07/25/2013  . Genital herpes 07/25/2013  . Allergic rhinitis 07/25/2013  . Sleep apnea 07/25/2013  . Urinary incontinence 07/25/2013  . Vertigo 07/25/2013  . Hip bursitis 07/25/2013   Current Outpatient Prescriptions on File Prior to Visit  Medication Sig Dispense Refill  . Albuterol (VENTOLIN IN) Inhale into the lungs.      . ALPRAZolam (XANAX) 0.5 MG tablet Take 1 tablet (0.5 mg total) by mouth at bedtime as needed for anxiety.  20 tablet  0  . DULERA 100-5 MCG/ACT AERO INHALE TWO PUFFS BY MOUTH TWICE DAILY  1 Inhaler  1  . estradiol (VIVELLE-DOT) 0.05 MG/24HR patch Place onto skin and change twice a week.  Give generic  8 patch  6  . montelukast (SINGULAIR) 10 MG tablet Take 10 mg by mouth at bedtime.      . MULTIPLE VITAMIN PO Take by mouth.      . Omega-3 Fatty Acids (FISH OIL) 600 MG CAPS Take by mouth.      . pravastatin (PRAVACHOL) 40 MG tablet Take 1 tablet (40  mg total) by mouth daily.  30 tablet  2  . traZODone (DESYREL) 150 MG tablet Take 150 mg by mouth at bedtime.      . triamcinolone (NASACORT) 55 MCG/ACT AERO nasal inhaler Place 2 sprays into the nose daily.      . TURMERIC PO Take 1 capsule by mouth daily.      . valACYclovir (VALTREX) 500 MG tablet Take 500 mg by mouth 2 (two) times daily.       No current facility-administered medications on file prior to visit.      Review of Systems See HPI    Objective:   Physical Exam Physical Exam  Nursing note and vitals reviewed.   Obvious laryngitis  Constitutional: She is oriented to person, place, and time. She appears well-developed and well-nourished. She is cooperative.  HENT:  Head: Normocephalic and atraumatic.  Nose: Mucosal edema present.  Eyes: Conjunctivae and EOM are normal. Pupils are equal, round, and reactive to light.  Neck: Neck supple.  Cardiovascular: Regular rhythm, normal heart sounds, intact distal pulses and normal pulses. Exam  reveals no gallop and no friction rub.  No murmur heard.  Pulmonary/Chest: She has no wheezes. She has rhonchi. She has no rales.  Neurological: She is alert and oriented to person, place, and time.  Skin: Skin is warm and dry. No abrasion, no bruising, no ecchymosis and no rash noted. No cyanosis. Nails show no clubbing.  Psychiatric: She has a normal mood and affect. Her speech is normal and behavior is normal.            Assessment & Plan:  Bronchitis  z-pak  Cough  Tessalon perles and Delsym OTC  Asthma    Continue Dulera bid add singulair .  Will give Solumedrol 125 in office   See me as needed

## 2014-04-08 NOTE — Patient Instructions (Signed)
See me as needed 

## 2014-04-08 NOTE — Telephone Encounter (Signed)
Medication refill request.

## 2014-04-09 MED ORDER — VALACYCLOVIR HCL 500 MG PO TABS: ORAL_TABLET | ORAL | Status: DC

## 2014-04-22 ENCOUNTER — Encounter: Payer: Self-pay | Admitting: Internal Medicine

## 2014-04-22 ENCOUNTER — Ambulatory Visit (INDEPENDENT_AMBULATORY_CARE_PROVIDER_SITE_OTHER): Payer: Commercial Managed Care - PPO | Admitting: Internal Medicine

## 2014-04-22 ENCOUNTER — Other Ambulatory Visit (INDEPENDENT_AMBULATORY_CARE_PROVIDER_SITE_OTHER): Payer: Commercial Managed Care - PPO | Admitting: *Deleted

## 2014-04-22 VITALS — BP 124/72 | HR 91 | Temp 98.2°F | Resp 12 | Ht 66.5 in | Wt 182.5 lb

## 2014-04-22 DIAGNOSIS — E042 Nontoxic multinodular goiter: Secondary | ICD-10-CM

## 2014-04-22 DIAGNOSIS — R7989 Other specified abnormal findings of blood chemistry: Secondary | ICD-10-CM

## 2014-04-22 DIAGNOSIS — R946 Abnormal results of thyroid function studies: Secondary | ICD-10-CM

## 2014-04-22 DIAGNOSIS — Z23 Encounter for immunization: Secondary | ICD-10-CM

## 2014-04-22 LAB — TSH: TSH: 0.31 u[IU]/mL — AB (ref 0.35–4.50)

## 2014-04-22 LAB — T4, FREE: FREE T4: 1.02 ng/dL (ref 0.60–1.60)

## 2014-04-22 LAB — T3, FREE: T3, Free: 2.7 pg/mL (ref 2.3–4.2)

## 2014-04-22 NOTE — Progress Notes (Signed)
Patient ID: Debra Hardin, female   DOB: 26-Nov-1960, 53 y.o.   MRN: [id number]   HPI  Debra Hardin is a 53 y.o.-year-old female, referred by her PCP, Dr. [name], for management of MNG.  She was told she had MNG in 2011. At that time, she was seeing Dr Altheimer. She had one nodule Bx'ed then >> L side. She did not return for f/u. She had a recent thyroid U/S that showed 4 nodules: 2 in the R and 2 in the L lobe.  Thyroid U/S (03/12/2014):  Right thyroid lobe: 5.1 x 1.9 x 2.8 cm.  - Predominantly cystic and partially solid dominant nodule in the midportion of the right lobe measures 1.9 x 1.1 x 1.5 cm. This nodule is similar in  size to the prior study and shows further cystic degeneration compared to prior ultrasound.  - Complex, predominantly cystic nodule in the inferior right lobe measures approximately 1.1 x 1.0 x 1.1 cm.   Left thyroid lobe: 5.2 x 1.5 x 2.2 cm.  - Nodular area in the midportion of the left lobe measures approximately 3.0 x 1.3 x 1.5 cm. This may actually be a conglomeration of 2-3 nodular areas  merging into one another. (previously Bx'ed) - Nodular area in the inferior left lobe measures 1.6 x 1.1 x 1.2 cm.   Isthmus Thickness: 0.2 cm. No nodules visualized.   Lymphadenopathy: None visualized.  I reviewed pt's thyroid tests: Lab Results  Component Value Date   TSH 0.820 07/25/2013    Pt denies feeling nodules in neck, hoarseness, dysphagia/odynophagia, SOB with lying down. Some compression sxs in neck when turns head to the L.   Pt c/o: - + fatigue - + hot flushes - on estradiol patches - + weight gain 15-20 lbs in 1 year >> now trying to lose weight - no tremors - no palpitations - + anxiety/+ depression - no hyperdefecation/constipation - no dry skin - + hair falling  Pt does not have a FH of thyroid ds. No FH of thyroid cancer. No h/o radiation tx to head or neck.  No seaweed or kelp, no recent contrast studies. + Recent steroid im inj for URI. No  herbal supplements.   ROS: Constitutional: see HPI Eyes: no blurry vision, no xerophthalmia ENT: no sore throat, no nodules palpated in throat, no dysphagia/odynophagia, no hoarseness Cardiovascular: no CP/SOB/palpitations/leg swelling Respiratory: no cough/SOB Gastrointestinal: no N/V/D/C Musculoskeletal:+ both: muscle/joint aches Skin: no rashes, + hair loss Neurological: no tremors/numbness/tingling/dizziness Psychiatric: + both depression/anxiety + low libido  Past Medical History  Diagnosis Date  . Asthma   . HSV-2 (herpes simplex virus 2) infection   . Anxiety   . Depression   . Insomnia   . Vaginal dryness   . Bronchitis    Past Surgical History  Procedure Laterality Date  . Cryotherapy    . Hernia repair    . Knee surgery    . Hysterotomy    . Combined hysterectomy vaginal / oophorectomy / a&p repair     History   Social History  . Marital Status: Married    Spouse Name: N/A    Number of Children: 2   Occupational History  . School counselor - [employer] History Main Topics  . Smoking status: Never Smoker   . Smokeless tobacco: Never Used  . Alcohol Use: Wine, beer 1-4 drinks 2xa mo  . Drug Use: No  . Sexual Activity: Yes    Birth Control/ Protection: Surgical  Comment: hysterectomy    Current Outpatient Prescriptions on File Prior to Visit  Medication Sig Dispense Refill  . Albuterol (VENTOLIN IN) Inhale into the lungs.      . ALPRAZolam (XANAX) 0.5 MG tablet Take 1 tablet (0.5 mg total) by mouth at bedtime as needed for anxiety.  20 tablet  0  . azithromycin (ZITHROMAX) 250 MG tablet Take as directed  6 tablet  0  . benzonatate (TESSALON) 100 MG capsule Take one capsule every 8 hours as needed for cough  20 capsule  0  . DULERA 100-5 MCG/ACT AERO INHALE TWO PUFFS INTO LUNGS TWICE DAILY  1 Inhaler  1  . estradiol (VIVELLE-DOT) 0.05 MG/24HR patch Place onto skin and change twice a week.  Give generic  8 patch  6  . montelukast  (SINGULAIR) 10 MG tablet Take 10 mg by mouth at bedtime.      . MULTIPLE VITAMIN PO Take by mouth.      . Omega-3 Fatty Acids (FISH OIL) 600 MG CAPS Take by mouth.      . pravastatin (PRAVACHOL) 40 MG tablet TAKE ONE TABLET BY MOUTH ONCE DAILY  30 tablet  3  . traZODone (DESYREL) 150 MG tablet Take 150 mg by mouth at bedtime.      . triamcinolone (NASACORT) 55 MCG/ACT AERO nasal inhaler Place 2 sprays into the nose daily.      . TURMERIC PO Take 1 capsule by mouth daily.      . valACYclovir (VALTREX) 500 MG tablet Take one bid for 5 days during rash outbreak  10 tablet  1   No current facility-administered medications on file prior to visit.   Allergies  Allergen Reactions  . Codeine    Family History  Problem Relation Age of Onset  . Bone cancer Paternal Grandmother   . Stroke Maternal Grandmother   . Heart attack Maternal Grandfather   . Stomach cancer Maternal Grandfather   . Heart attack Father 67  . Heart disease Mother   . Depression Mother   . Colon cancer Maternal Uncle   . Depression Sister   . Depression Brother   . Mental illness Daughter    PE: BP 124/72  Pulse 91  Temp(Src) 98.2 F (36.8 C) (Oral)  Resp 12  Ht 5' 6.5" (1.689 m)  Wt 182 lb 8 oz (82.781 kg)  BMI 29.02 kg/m2  SpO2 96% Wt Readings from Last 3 Encounters:  04/22/14 182 lb 8 oz (82.781 kg)  04/08/14 186 lb (84.369 kg)  01/23/14 194 lb (87.998 kg)   Constitutional: overweight, in NAD Eyes: PERRLA, EOMI, no exophthalmos ENT: moist mucous membranes, + thyromegaly L>R, no cervical lymphadenopathy Cardiovascular: RRR, No MRG Respiratory: CTA B Gastrointestinal: abdomen soft, NT, ND, BS+ Musculoskeletal: no deformities, strength intact in all 4;  Skin: moist, warm, no rashes Neurological: mild tremor with outstretched hands, DTR normal in all 4  ASSESSMENT: 1. MNG - thyroid U/S:   PLAN: 1. MNG  - I reviewed the images of her thyroid ultrasound along with the patient. I pointed out that  the dominant nodules are not very large (the 3 cm R nodule is a conglomerate of 2 separate nodules). The L thyroid nodule is mostly cystic, and the solid component degenerated further since last U/S. The other R nodule is <1.5 cm, w/o concerning features. The large L thyroid nodule was already Bx'ed in 2011, while the smaller L nodule is isoechoic and w/o concerning features. Pt does not have a thyroid  cancer family history or a personal history of RxTx to head/neck. All these would favor benignity.  - since there are no neck compression sxs (only some discomfort on turning head to the L), we decided to repeat the U/S in 2 years and decide whether we need a new Bx or not at that time. - she agrees with the plan - we will check TFTs now and, if TSH low, may need an Uptake and scan  - she will let me know if she develops neck compression sxs before next visit, in 1 year.  Office Visit on 04/22/2014  Component Date Value Ref Range Status  . TSH 04/22/2014 0.31* 0.35 - 4.50 uIU/mL Final  . Free T4 04/22/2014 1.02  0.60 - 1.60 ng/dL Final  . T3, Free 04/22/2014 2.7  2.3 - 4.2 pg/mL Final  Mildly low TSH with normal free t4 and free t3. Will ask her to come back for repeat labs in 2 months. May need a thyroid uptake and scan if they are worse.

## 2014-04-22 NOTE — Patient Instructions (Signed)
Please stop at the lab.  Please return in 1 year.  

## 2014-05-06 ENCOUNTER — Encounter: Payer: Self-pay | Admitting: Internal Medicine

## 2014-05-23 ENCOUNTER — Other Ambulatory Visit: Payer: Self-pay | Admitting: *Deleted

## 2014-05-23 MED ORDER — VALACYCLOVIR HCL 500 MG PO TABS: ORAL_TABLET | ORAL | Status: DC

## 2014-05-23 NOTE — Telephone Encounter (Signed)
She would like to get #30 at a time for the Valtrex.

## 2014-06-19 ENCOUNTER — Other Ambulatory Visit: Payer: Commercial Managed Care - PPO

## 2014-06-24 ENCOUNTER — Other Ambulatory Visit: Payer: Self-pay | Admitting: Internal Medicine

## 2014-06-24 NOTE — Telephone Encounter (Signed)
Refill request

## 2014-07-02 ENCOUNTER — Other Ambulatory Visit (INDEPENDENT_AMBULATORY_CARE_PROVIDER_SITE_OTHER): Payer: Commercial Managed Care - PPO

## 2014-07-02 DIAGNOSIS — R946 Abnormal results of thyroid function studies: Secondary | ICD-10-CM

## 2014-07-02 DIAGNOSIS — R7989 Other specified abnormal findings of blood chemistry: Secondary | ICD-10-CM

## 2014-07-03 LAB — T4, FREE: FREE T4: 0.83 ng/dL (ref 0.60–1.60)

## 2014-07-03 LAB — T3, FREE: T3, Free: 3.3 pg/mL (ref 2.3–4.2)

## 2014-07-03 LAB — TSH: TSH: 1.25 u[IU]/mL (ref 0.35–4.50)

## 2014-08-03 ENCOUNTER — Other Ambulatory Visit: Payer: Self-pay | Admitting: Internal Medicine

## 2014-08-05 NOTE — Telephone Encounter (Signed)
Refill request

## 2014-09-27 ENCOUNTER — Other Ambulatory Visit: Payer: Self-pay | Admitting: Internal Medicine

## 2014-09-30 NOTE — Telephone Encounter (Signed)
Refill request

## 2014-10-29 ENCOUNTER — Other Ambulatory Visit: Payer: Self-pay | Admitting: *Deleted

## 2014-10-29 DIAGNOSIS — Z0000 Encounter for general adult medical examination without abnormal findings: Secondary | ICD-10-CM

## 2014-10-30 ENCOUNTER — Ambulatory Visit (INDEPENDENT_AMBULATORY_CARE_PROVIDER_SITE_OTHER): Payer: Commercial Managed Care - PPO | Admitting: Internal Medicine

## 2014-10-30 ENCOUNTER — Encounter: Payer: Self-pay | Admitting: Internal Medicine

## 2014-10-30 ENCOUNTER — Ambulatory Visit (INDEPENDENT_AMBULATORY_CARE_PROVIDER_SITE_OTHER): Payer: Commercial Managed Care - PPO | Admitting: Family Medicine

## 2014-10-30 ENCOUNTER — Encounter: Payer: Self-pay | Admitting: Family Medicine

## 2014-10-30 VITALS — BP 106/70 | HR 62 | Ht 67.0 in | Wt 182.0 lb

## 2014-10-30 VITALS — BP 122/77 | HR 77 | Resp 16 | Ht 66.5 in | Wt 182.0 lb

## 2014-10-30 DIAGNOSIS — Z0000 Encounter for general adult medical examination without abnormal findings: Secondary | ICD-10-CM | POA: Diagnosis not present

## 2014-10-30 DIAGNOSIS — E041 Nontoxic single thyroid nodule: Secondary | ICD-10-CM

## 2014-10-30 DIAGNOSIS — Z1211 Encounter for screening for malignant neoplasm of colon: Secondary | ICD-10-CM | POA: Diagnosis not present

## 2014-10-30 DIAGNOSIS — M25521 Pain in right elbow: Secondary | ICD-10-CM | POA: Diagnosis not present

## 2014-10-30 DIAGNOSIS — M7711 Lateral epicondylitis, right elbow: Secondary | ICD-10-CM | POA: Diagnosis not present

## 2014-10-30 DIAGNOSIS — N951 Menopausal and female climacteric states: Secondary | ICD-10-CM | POA: Diagnosis not present

## 2014-10-30 DIAGNOSIS — J4520 Mild intermittent asthma, uncomplicated: Secondary | ICD-10-CM

## 2014-10-30 LAB — TSH: TSH: 0.936 u[IU]/mL (ref 0.350–4.500)

## 2014-10-30 LAB — POCT URINALYSIS DIPSTICK
Bilirubin, UA: NEGATIVE
Glucose, UA: NEGATIVE
Ketones, UA: NEGATIVE
Leukocytes, UA: NEGATIVE
NITRITE UA: NEGATIVE
PH UA: 6.5
Protein, UA: NEGATIVE
RBC UA: NEGATIVE
Spec Grav, UA: 1.015
UROBILINOGEN UA: NEGATIVE

## 2014-10-30 LAB — COMPLETE METABOLIC PANEL WITH GFR
ALBUMIN: 4.4 g/dL (ref 3.5–5.2)
ALK PHOS: 47 U/L (ref 39–117)
ALT: 14 U/L (ref 0–35)
AST: 14 U/L (ref 0–37)
BUN: 13 mg/dL (ref 6–23)
CO2: 28 mEq/L (ref 19–32)
Calcium: 9.3 mg/dL (ref 8.4–10.5)
Chloride: 100 mEq/L (ref 96–112)
Creat: 0.86 mg/dL (ref 0.50–1.10)
GFR, Est African American: 88 mL/min
GFR, Est Non African American: 76 mL/min
Glucose, Bld: 91 mg/dL (ref 70–99)
POTASSIUM: 4.5 meq/L (ref 3.5–5.3)
SODIUM: 137 meq/L (ref 135–145)
TOTAL PROTEIN: 6.9 g/dL (ref 6.0–8.3)
Total Bilirubin: 0.4 mg/dL (ref 0.2–1.2)

## 2014-10-30 LAB — LIPID PANEL
Cholesterol: 168 mg/dL (ref 0–200)
HDL: 68 mg/dL (ref >=46)
LDL Cholesterol: 90 mg/dL (ref 0–99)
Total CHOL/HDL Ratio: 2.5 Ratio
Triglycerides: 50 mg/dL (ref <150)
VLDL: 10 mg/dL (ref 0–40)

## 2014-10-30 LAB — HEMOCCULT GUIAC POC 1CARD (OFFICE): FECAL OCCULT BLD: NEGATIVE

## 2014-10-30 MED ORDER — ESTRADIOL 0.05 MG/24HR TD PTTW: MEDICATED_PATCH | TRANSDERMAL | Status: DC

## 2014-10-30 MED ORDER — TRAZODONE HCL 150 MG PO TABS: 150.0000 mg | ORAL_TABLET | Freq: Every day | ORAL | Status: DC

## 2014-10-30 MED ORDER — ALBUTEROL SULFATE HFA 108 (90 BASE) MCG/ACT IN AERS: 2.0000 | INHALATION_SPRAY | Freq: Four times a day (QID) | RESPIRATORY_TRACT | Status: AC | PRN

## 2014-10-30 MED ORDER — FLUOXETINE HCL 40 MG PO CAPS
40.0000 mg | ORAL_CAPSULE | Freq: Every day | ORAL | Status: DC
Start: 2014-10-30 — End: 2018-01-24

## 2014-10-30 MED ORDER — VALACYCLOVIR HCL 500 MG PO TABS: ORAL_TABLET | ORAL | Status: DC

## 2014-10-30 MED ORDER — METHYLPREDNISOLONE ACETATE 40 MG/ML IJ SUSP
40.0000 mg | Freq: Once | INTRAMUSCULAR | Status: AC
  Administered 2014-10-30: 40 mg via INTRA_ARTICULAR

## 2014-10-30 MED ORDER — PRAVASTATIN SODIUM 40 MG PO TABS: 40.0000 mg | ORAL_TABLET | Freq: Every day | ORAL | Status: DC

## 2014-10-30 MED ORDER — MOMETASONE FURO-FORMOTEROL FUM 100-5 MCG/ACT IN AERO: INHALATION_SPRAY | RESPIRATORY_TRACT | Status: DC

## 2014-10-30 MED ORDER — MONTELUKAST SODIUM 10 MG PO TABS: 10.0000 mg | ORAL_TABLET | Freq: Every day | ORAL | Status: DC

## 2014-10-30 NOTE — Patient Instructions (Signed)
You have lateral epicondylitis Try to avoid painful activities as much as possible. Ice the area 3-4 times a day for 15 minutes at a time. Tylenol or aleve as needed for pain. Counterforce brace as directed can help unload area - wear this regularly if it provides you with relief. Hammer rotation exercise, wrist extension exercise with 1 pound weight - 3 sets of 10 once a day.   Stretching - hold for 20-30 seconds and repeat 3 times. You were given an injection today. We will send a fax to Barbaraann Barthel to add physical therapy for this. Consider nitro patches if not improving. Follow up in 6 weeks.

## 2014-10-30 NOTE — Progress Notes (Signed)
Subjective:    Patient ID: [name], female    DOB: 1961/04/17, 54 y.o.   MRN: [id number]  HPI  04/22/2014 endocrine note PLAN: 1. MNG  - I reviewed the images of her thyroid ultrasound along with the patient. I pointed out that the dominant nodules are not very large (the 3 cm R nodule is a conglomerate of 2 separate nodules). The L thyroid nodule is mostly cystic, and the solid component degenerated further since last U/S. The other R nodule is <1.5 cm, w/o concerning features. The large L thyroid nodule was already Bx'ed in 2011, while the smaller L nodule is isoechoic and w/o concerning features. Pt does not have a thyroid cancer family history or a personal history of RxTx to head/neck. All these would favor benignity.  - since there are no neck compression sxs (only some discomfort on turning head to the L), we decided to repeat the U/S in 2 years and decide whether we need a new Bx or not at that time. - she agrees with the plan - we will check TFTs now and, if TSH low, may need an Uptake and scan  - she will let me know if she develops neck compression sxs before next visit, in 1 year.  Office Visit on 04/22/2014  Component Date Value Ref Range Status  . TSH 04/22/2014 0.31* 0.35 - 4.50 uIU/mL Final  . Free T4 04/22/2014 1.02  0.60 - 1.60 ng/dL Final  . T3, Free 04/22/2014 2.7  2.3 - 4.2 pg/mL Final  Mildly low TSH with normal free t4 and free t3. Will ask her to come back for repeat labs in 2 months. May need a thyroid uptake and scan if they are worse.       01/2014 my note Assessment & Plan:  Vasomotor flushes Continue Vivelle dot twice a week Sheis S/P Hysterectomy  MNG Will reschedule Thyroid U/S Pt voices understanding  Asthma: Ok to take [facility] once a day   Situational anxiety Has been able to reduce dose of meds. buspar has been D/c'd She will be starting with new psychiatrist soon        TODAY:  Debra Hardin is here for  CPE  HM  Mm due next month  S/P hysterectomy,  < 1 pack year smoker in high school   Colonoscopy done 2012 Dr. Collene Mares  Having lots of pain in right elbow.  She is a Therapist, nutritional and a Animal nutritionist.  Uses right arm frequently on computer and playing music   "tightness" down Right arm  No numbness or tingling.  NO injury or trauma.  Had recent Asian cupping done on Right arm   NSAIDS not helping  Less anxiety with teens at home.  Daughter with eating disorder doing better.   Using less BZP  Asthma  Peak flow  400 has not needed rescue inhaler for several months  Having frequent genital herpes symptoms  2-3 times per month she will feel burning and treats with Valtrex before any skin rash occurs.  Never has been on preventive treatment  Menopausal vasomotor flushes  On Vivelle dot since 11/2013  Flushes markedly improved   Allergies  Allergen Reactions  . Codeine    Past Medical History  Diagnosis Date  . Asthma   . HSV-2 (herpes simplex virus 2) infection   . Anxiety   . Depression   . Insomnia   . Vaginal dryness   . Bronchitis    Past Surgical History  Procedure  Laterality Date  . Cryotherapy    . Hernia repair    . Knee surgery    . Hysterotomy    . Combined hysterectomy vaginal / oophorectomy / a&p repair     History   Social History  . Marital Status: Married    Spouse Name: N/A  . Number of Children: N/A  . Years of Education: N/A   Occupational History  . Not on file.   Social History Main Topics  . Smoking status: Never Smoker   . Smokeless tobacco: Never Used  . Alcohol Use: No  . Drug Use: No  . Sexual Activity: Yes    Birth Control/ Protection: Surgical     Comment: hysterectomy    Other Topics Concern  . Not on file   Social History Narrative   Family History  Problem Relation Age of Onset  . Bone cancer Paternal Grandmother   . Stroke Maternal Grandmother   . Heart attack Maternal Grandfather   . Stomach cancer Maternal Grandfather   . Heart  attack Father 16  . Heart disease Mother   . Depression Mother   . Colon cancer Maternal Uncle   . Depression Sister   . Depression Brother   . Mental illness Daughter    Patient Active Problem List   Diagnosis Date Noted  . Hot flushes, perimenopausal  HT began 11/2013 10/28/2013  . Multinodular goiter 07/25/2013  . Anxiety 07/25/2013  . Asthma 07/25/2013  . DJD (degenerative joint disease) 07/25/2013  . Depression 07/25/2013  . Hyperlipidemia 07/25/2013  . S/P hysterectomy 07/25/2013  . Genital herpes 07/25/2013  . Allergic rhinitis 07/25/2013  . Sleep apnea 07/25/2013  . Urinary incontinence 07/25/2013  . Vertigo 07/25/2013  . Hip bursitis 07/25/2013   Current Outpatient Prescriptions on File Prior to Visit  Medication Sig Dispense Refill  . Albuterol (VENTOLIN IN) Inhale into the lungs.    . ALPRAZolam (XANAX) 0.5 MG tablet Take 1 tablet (0.5 mg total) by mouth at bedtime as needed for anxiety. 20 tablet 0  . azithromycin (ZITHROMAX) 250 MG tablet Take as directed 6 tablet 0  . benzonatate (TESSALON) 100 MG capsule Take one capsule every 8 hours as needed for cough 20 capsule 0  . DULERA 100-5 MCG/ACT AERO INHALE TWO PUFFS INTO LUNGS TWICE DAILY 1 Inhaler 1  . estradiol (VIVELLE-DOT) 0.05 MG/24HR patch PLACE ONTO SKIN AND CHANGE TWICE A WEEK 8 patch 5  . montelukast (SINGULAIR) 10 MG tablet Take 10 mg by mouth at bedtime.    . MULTIPLE VITAMIN PO Take by mouth.    . Omega-3 Fatty Acids (FISH OIL) 600 MG CAPS Take by mouth.    . pravastatin (PRAVACHOL) 40 MG tablet TAKE ONE TABLET BY MOUTH ONCE DAILY 30 tablet 3  . traZODone (DESYREL) 150 MG tablet Take 150 mg by mouth at bedtime.    . triamcinolone (NASACORT) 55 MCG/ACT AERO nasal inhaler Place 2 sprays into the nose daily.    . TURMERIC PO Take 1 capsule by mouth daily.    . valACYclovir (VALTREX) 500 MG tablet TAKE ONE CAPLET BY MOUTH TWICE DAILY FOR 5 DAYS DURING RASH OUTBREAK. 10 tablet 2   No current  facility-administered medications on file prior to visit.      Review of Systems  Respiratory: Negative for cough, chest tightness, shortness of breath and wheezing.   Cardiovascular: Negative for chest pain, palpitations and leg swelling.  All other systems reviewed and are negative.      Objective:  Physical Exam Physical Exam  Nursing note and vitals reviewed.  Constitutional: She is oriented to person, place, and time. She appears well-developed and well-nourished.  HENT:  Head: Normocephalic and atraumatic.  Right Ear: Tympanic membrane and ear canal normal. No drainage. Tympanic membrane is not injected and not erythematous.  Left Ear: Tympanic membrane and ear canal normal. No drainage. Tympanic membrane is not injected and not erythematous.  Nose: Nose normal. Right sinus exhibits no maxillary sinus tenderness and no frontal sinus tenderness. Left sinus exhibits no maxillary sinus tenderness and no frontal sinus tenderness.  Mouth/Throat: Oropharynx is clear and moist. No oral lesions. No oropharyngeal exudate.  Eyes: Conjunctivae and EOM are normal. Pupils are equal, round, and reactive to light.  Neck: Normal range of motion. Neck supple. No JVD present. Carotid bruit is not present. No mass and no thyromegaly present.  Cardiovascular: Normal rate, regular rhythm, S1 normal, S2 normal and intact distal pulses. Exam reveals no gallop and no friction rub.  No murmur heard.  Pulses:  Carotid pulses are 2+ on the right side, and 2+ on the left side.  Dorsalis pedis pulses are 2+ on the right side, and 2+ on the left side.  No carotid bruit. No LE edema  Pulmonary/Chest: Breath sounds normal. She has no wheezes. She has no rales. She exhibits no tenderness.  Breast no discrete mass no nipple discharge no axillary adenopathy bilaterally Abdominal: Soft. Bowel sounds are normal. She exhibits no distension and no mass. There is no hepatosplenomegaly. There is no tenderness. There  is no CVA tenderness. Rectal no mass guaiac neg Musculoskeletal: Normal range of motion.  No active synovitis to joints.  Lymphadenopathy:  She has no cervical adenopathy.  She has no axillary adenopathy.  Right: No inguinal and no supraclavicular adenopathy present.  Left: No inguinal and no supraclavicular adenopathy present.  Neurological: She is alert and oriented to person, place, and time. She has normal strength and normal reflexes. She displays no tremor. No cranial nerve deficit or sensory deficit. Coordination and gait normal.  Skin: Skin is warm and dry. No rash noted. No cyanosis. Nails show no clubbing.   Cupping marks along right arm Psychiatric: She has a normal mood and affect. Her speech is normal and behavior is normal. Cognition and memory are normal.        Assessment & Plan:  HM  Mm due next month  S/P hysterectomy,  < 1 pack year smoker in high school   Colonoscopy done 2012 Dr. Collene Mares  MNG and multiple bilateral nodules  No compressive synmptoms.  ADvised to follow with Dr. Cruzita Lederer in October   Having lots of pain in right elbow.  She is a Therapist, nutritional and a Animal nutritionist.  Uses right arm frequently on computer and playing music   "tightness" down Right arm  No numbness or tingling.  NO injury or trauma.  Had recent Asian cupping done on Right arm .   Suspect tenditnitis   NSAIDs not helping will refer to Dr. Barbaraann Barthel  Less anxiety with teens at home.  Daughter with eating disorder doing better.   Using less BZP  OK for Prozac and prn BZP   Asthma  Peak flow  400 has not needed rescue inhaler for several months.  Continue Dulera, singulair and rescue as needed  Having frequent genital herpes symptoms  2-3 times per month she will feel burning and treats with Valtrex before any skin rash occurs.  Will try preventive TX with 500  mg Valtrex daily for next 3 months .    Menopausal vasomotor flushes  On Vivelle dot since 11/2013  Flushes markedly improved .  Counseled [facility]  study at 6 years with estrogen slight increased Breast cancer risk.    Ok to continue estradiol patch until age 32,  Can try to taper at 3 years or if pt wishes to taper.  ADvised not to taper off in summer  Pt has received letter of my departure and states she has new MD appt

## 2014-10-31 ENCOUNTER — Encounter: Payer: Self-pay | Admitting: *Deleted

## 2014-10-31 DIAGNOSIS — M7711 Lateral epicondylitis, right elbow: Secondary | ICD-10-CM | POA: Insufficient documentation

## 2014-10-31 LAB — CBC WITH DIFFERENTIAL/PLATELET
BASOS ABS: 0 10*3/uL (ref 0.0–0.1)
Basophils Relative: 1 % (ref 0–1)
EOS PCT: 3 % (ref 0–5)
Eosinophils Absolute: 0.1 10*3/uL (ref 0.0–0.7)
HCT: 38.6 % (ref 36.0–46.0)
Hemoglobin: 12.8 g/dL (ref 12.0–15.0)
Lymphocytes Relative: 37 % (ref 12–46)
Lymphs Abs: 1.3 10*3/uL (ref 0.7–4.0)
MCH: 29.8 pg (ref 26.0–34.0)
MCHC: 33.2 g/dL (ref 30.0–36.0)
MCV: 90 fL (ref 78.0–100.0)
MONO ABS: 0.3 10*3/uL (ref 0.1–1.0)
MPV: 9.6 fL (ref 8.6–12.4)
Monocytes Relative: 9 % (ref 3–12)
Neutro Abs: 1.7 10*3/uL (ref 1.7–7.7)
Neutrophils Relative %: 50 % (ref 43–77)
PLATELETS: 211 10*3/uL (ref 150–400)
RBC: 4.29 MIL/uL (ref 3.87–5.11)
RDW: 13.7 % (ref 11.5–15.5)
WBC: 3.4 10*3/uL — ABNORMAL LOW (ref 4.0–10.5)

## 2014-10-31 LAB — VITAMIN D 25 HYDROXY (VIT D DEFICIENCY, FRACTURES): Vit D, 25-Hydroxy: 36 ng/mL (ref 30–100)

## 2014-10-31 NOTE — Progress Notes (Signed)
PCP: SCHOENHOFF,DEBBIE, MD  Subjective:   HPI: Patient is a 54 y.o. female here for right elbow pain.  Patient reports for about 2 months she's had worsening lateral right elbow pain into the forearm. Difficulty typing, using coffee cup. Has been icing, taking ibuprofen. No known injury or trauma. Pain level 3/10 currently. Some localized swelling. Impacting her ability to play guitar. Right handed.  Past Medical History  Diagnosis Date  . Asthma   . HSV-2 (herpes simplex virus 2) infection   . Anxiety   . Depression   . Insomnia   . Vaginal dryness   . Bronchitis     Current Outpatient Prescriptions on File Prior to Visit  Medication Sig Dispense Refill  . albuterol (PROVENTIL HFA;VENTOLIN HFA) 108 (90 BASE) MCG/ACT inhaler Inhale 2 puffs into the lungs every 6 (six) hours as needed for wheezing or shortness of breath. 1 Inhaler 1  . Albuterol (VENTOLIN IN) Inhale into the lungs.    . ALPRAZolam (XANAX) 0.5 MG tablet Take 1 tablet (0.5 mg total) by mouth at bedtime as needed for anxiety. 20 tablet 0  . estradiol (VIVELLE-DOT) 0.05 MG/24HR patch PLACE ONTO SKIN AND CHANGE TWICE A WEEK 8 patch 5  . FLUoxetine (PROZAC) 40 MG capsule Take 1 capsule (40 mg total) by mouth daily. 90 capsule 1  . mometasone-formoterol (DULERA) 100-5 MCG/ACT AERO INHALE TWO PUFFS INTO LUNGS TWICE DAILY 1 Inhaler 2  . montelukast (SINGULAIR) 10 MG tablet Take 1 tablet (10 mg total) by mouth at bedtime. 90 tablet 1  . MULTIPLE VITAMIN PO Take by mouth.    . Omega-3 Fatty Acids (FISH OIL) 600 MG CAPS Take by mouth.    . pravastatin (PRAVACHOL) 40 MG tablet Take 1 tablet (40 mg total) by mouth daily. 90 tablet 1  . traZODone (DESYREL) 150 MG tablet Take 1 tablet (150 mg total) by mouth at bedtime. 90 tablet 1  . triamcinolone (NASACORT) 55 MCG/ACT AERO nasal inhaler Place 2 sprays into the nose daily.    . TURMERIC PO Take 1 capsule by mouth daily.    . valACYclovir (VALTREX) 500 MG tablet Take one  daily 90 tablet 1   No current facility-administered medications on file prior to visit.    Past Surgical History  Procedure Laterality Date  . Cryotherapy    . Hernia repair    . Knee surgery    . Hysterotomy    . Combined hysterectomy vaginal / oophorectomy / a&p repair      Allergies  Allergen Reactions  . Codeine     History   Social History  . Marital Status: Married    Spouse Name: N/A  . Number of Children: N/A  . Years of Education: N/A   Occupational History  . Not on file.   Social History Main Topics  . Smoking status: Never Smoker   . Smokeless tobacco: Never Used  . Alcohol Use: No  . Drug Use: No  . Sexual Activity: Yes    Birth Control/ Protection: Surgical     Comment: hysterectomy    Other Topics Concern  . Not on file   Social History Narrative    Family History  Problem Relation Age of Onset  . Bone cancer Paternal Grandmother   . Stroke Maternal Grandmother   . Heart attack Maternal Grandfather   . Stomach cancer Maternal Grandfather   . Heart attack Father 91  . Heart disease Mother   . Depression Mother   . Colon cancer  Maternal Uncle   . Depression Sister   . Depression Brother   . Mental illness Daughter     BP 106/70 mmHg  Pulse 62  Ht 5\' 7"  (1.702 m)  Wt 182 lb (82.555 kg)  BMI 28.50 kg/m2  Review of Systems: See HPI above.    Objective:  Physical Exam:  Gen: NAD  Right elbow: No gross deformity, swelling, bruising. TTP lateral epicondyle and just distal to this.   FROM with pain on resisted wrist extension and 3rd digit extension, supination. Collateral ligaments intact. NVI distally.    Assessment & Plan:  1. Right lateral epicondylitis - shown home exercises to do today but will also add PT for this.  Icing, tylenol/nsaids as needed.  Injection given today.  Consider nitro patches if not improving.  F/u in 6 weeks.  After informed written consent patient was lying on exam table.  Area just distal to right  lateral epicondyle prepped with alcohol swab then injected with 2:1 marcaine: depomedrol.  Patient tolerated procedure well without immediate complications.

## 2014-10-31 NOTE — Assessment & Plan Note (Signed)
shown home exercises to do today but will also add PT for this.  Icing, tylenol/nsaids as needed.  Injection given today.  Consider nitro patches if not improving.  F/u in 6 weeks.  After informed written consent patient was lying on exam table.  Area just distal to right lateral epicondyle prepped with alcohol swab then injected with 2:1 marcaine: depomedrol.  Patient tolerated procedure well without immediate complications.

## 2015-01-03 DIAGNOSIS — M7060 Trochanteric bursitis, unspecified hip: Secondary | ICD-10-CM

## 2015-01-03 HISTORY — DX: Trochanteric bursitis, unspecified hip: M70.60

## 2015-01-08 ENCOUNTER — Ambulatory Visit (INDEPENDENT_AMBULATORY_CARE_PROVIDER_SITE_OTHER): Payer: Commercial Managed Care - PPO | Admitting: Family Medicine

## 2015-01-08 ENCOUNTER — Encounter (INDEPENDENT_AMBULATORY_CARE_PROVIDER_SITE_OTHER): Payer: Self-pay

## 2015-01-08 ENCOUNTER — Encounter: Payer: Self-pay | Admitting: Family Medicine

## 2015-01-08 VITALS — BP 112/72 | HR 71 | Ht 67.0 in | Wt 180.0 lb

## 2015-01-08 DIAGNOSIS — M7061 Trochanteric bursitis, right hip: Secondary | ICD-10-CM

## 2015-01-08 DIAGNOSIS — M25551 Pain in right hip: Secondary | ICD-10-CM | POA: Diagnosis not present

## 2015-01-08 MED ORDER — METHYLPREDNISOLONE ACETATE 40 MG/ML IJ SUSP
40.0000 mg | Freq: Once | INTRAMUSCULAR | Status: AC
  Administered 2015-01-08: 40 mg via INTRA_ARTICULAR

## 2015-01-08 NOTE — Assessment & Plan Note (Signed)
2/2 trochanteric bursitis and IT band syndrome.  Shown home exercises and stretches to do daily.  Can continue with physical therapy as well.  Given trochanteric bursa injection.  F/u in 1 month.  After informed written consent patient was lying on left side on exam table.  Area overlying right trochanteric bursa prepped with alcohol swab then injected with 6:2 marcaine: depomedrol.  Patient tolerated procedure well without immediate complications.

## 2015-01-08 NOTE — Progress Notes (Signed)
PCP: SCHOENHOFF,DEBBIE, MD  Subjective:   HPI: Patient is a 54 y.o. female here for hip pain.  Patient reports she's had right hip pain for a while though worse the past few months. Difficulty lying on right side. Tender to the touch. Tried PT, home exercises, losing weight. No radiation of pain. No numbness/tingling. No bowel/bladder dysfunction.  Past Medical History  Diagnosis Date  . Asthma   . HSV-2 (herpes simplex virus 2) infection   . Anxiety   . Depression   . Insomnia   . Vaginal dryness   . Bronchitis     Current Outpatient Prescriptions on File Prior to Visit  Medication Sig Dispense Refill  . albuterol (PROVENTIL HFA;VENTOLIN HFA) 108 (90 BASE) MCG/ACT inhaler Inhale 2 puffs into the lungs every 6 (six) hours as needed for wheezing or shortness of breath. 1 Inhaler 1  . Albuterol (VENTOLIN IN) Inhale into the lungs.    . ALPRAZolam (XANAX) 0.5 MG tablet Take 1 tablet (0.5 mg total) by mouth at bedtime as needed for anxiety. 20 tablet 0  . estradiol (VIVELLE-DOT) 0.05 MG/24HR patch PLACE ONTO SKIN AND CHANGE TWICE A WEEK 8 patch 5  . FLUoxetine (PROZAC) 40 MG capsule Take 1 capsule (40 mg total) by mouth daily. 90 capsule 1  . mometasone-formoterol (DULERA) 100-5 MCG/ACT AERO INHALE TWO PUFFS INTO LUNGS TWICE DAILY 1 Inhaler 2  . montelukast (SINGULAIR) 10 MG tablet Take 1 tablet (10 mg total) by mouth at bedtime. 90 tablet 1  . MULTIPLE VITAMIN PO Take by mouth.    . Omega-3 Fatty Acids (FISH OIL) 600 MG CAPS Take by mouth.    . pravastatin (PRAVACHOL) 40 MG tablet Take 1 tablet (40 mg total) by mouth daily. 90 tablet 1  . traZODone (DESYREL) 150 MG tablet Take 1 tablet (150 mg total) by mouth at bedtime. 90 tablet 1  . triamcinolone (NASACORT) 55 MCG/ACT AERO nasal inhaler Place 2 sprays into the nose daily.    . TURMERIC PO Take 1 capsule by mouth daily.    . valACYclovir (VALTREX) 500 MG tablet Take one daily 90 tablet 1   No current facility-administered  medications on file prior to visit.    Past Surgical History  Procedure Laterality Date  . Cryotherapy    . Hernia repair    . Knee surgery    . Hysterotomy    . Combined hysterectomy vaginal / oophorectomy / a&p repair      Allergies  Allergen Reactions  . Codeine     History   Social History  . Marital Status: Married    Spouse Name: N/A  . Number of Children: N/A  . Years of Education: N/A   Occupational History  . Not on file.   Social History Main Topics  . Smoking status: Never Smoker   . Smokeless tobacco: Never Used  . Alcohol Use: No  . Drug Use: No  . Sexual Activity: Yes    Birth Control/ Protection: Surgical     Comment: hysterectomy    Other Topics Concern  . Not on file   Social History Narrative    Family History  Problem Relation Age of Onset  . Bone cancer Paternal Grandmother   . Stroke Maternal Grandmother   . Heart attack Maternal Grandfather   . Stomach cancer Maternal Grandfather   . Heart attack Father 52  . Heart disease Mother   . Depression Mother   . Colon cancer Maternal Uncle   . Depression Sister   .  Depression Brother   . Mental illness Daughter     BP 112/72 mmHg  Pulse 71  Ht 5\' 7"  (1.702 m)  Wt 180 lb (81.647 kg)  BMI 28.19 kg/m2  Review of Systems: See HPI above.    Objective:  Physical Exam:  Gen: NAD  Back/R hip: No gross deformity, scoliosis. TTP right greater trochanter.  Less tenderness elsewhere in IT band.  No midline or bony TTP. FROM with 4/5 strength hip abduction. Strength LEs 5/5 all muscle groups except hip abduction.   Negative SLRs. Sensation intact to light touch bilaterally. Negative logroll bilateral hips Negative fabers and piriformis stretches.    Assessment & Plan:  1. Right hip pain - 2/2 trochanteric bursitis and IT band syndrome.  Shown home exercises and stretches to do daily.  Can continue with physical therapy as well.  Given trochanteric bursa injection.  F/u in 1  month.  After informed written consent patient was lying on left side on exam table.  Area overlying right trochanteric bursa prepped with alcohol swab then injected with 6:2 marcaine: depomedrol.  Patient tolerated procedure well without immediate complications.

## 2015-01-08 NOTE — Patient Instructions (Signed)
You have IT band syndrome and trochanteric bursitis. Avoid painful activities as much as possible. Ice over area of pain 3-4 times a day for 15 minutes at a time Hip side raise exercise 3 sets of 10-15 once a day - add weights if this becomes too easy. Stretches - pick 2 and hold for 20-30 seconds x 3 - do once or twice a day. Tylenol and/or aleve as needed for pain. You were given a cortisone shot today. Follow up with me in 1 month.

## 2015-01-30 ENCOUNTER — Ambulatory Visit (INDEPENDENT_AMBULATORY_CARE_PROVIDER_SITE_OTHER): Payer: Commercial Managed Care - PPO | Admitting: Family Medicine

## 2015-01-30 ENCOUNTER — Encounter: Payer: Self-pay | Admitting: Family Medicine

## 2015-01-30 VITALS — BP 110/62 | HR 64 | Temp 97.7°F | Ht 66.5 in | Wt 185.0 lb

## 2015-01-30 DIAGNOSIS — M7711 Lateral epicondylitis, right elbow: Secondary | ICD-10-CM | POA: Diagnosis not present

## 2015-01-30 DIAGNOSIS — J45998 Other asthma: Secondary | ICD-10-CM

## 2015-01-30 DIAGNOSIS — H9203 Otalgia, bilateral: Secondary | ICD-10-CM | POA: Diagnosis not present

## 2015-01-30 DIAGNOSIS — R1310 Dysphagia, unspecified: Secondary | ICD-10-CM

## 2015-01-30 DIAGNOSIS — G4733 Obstructive sleep apnea (adult) (pediatric): Secondary | ICD-10-CM

## 2015-01-30 DIAGNOSIS — Z9989 Dependence on other enabling machines and devices: Secondary | ICD-10-CM

## 2015-01-30 DIAGNOSIS — J309 Allergic rhinitis, unspecified: Secondary | ICD-10-CM

## 2015-01-30 NOTE — Progress Notes (Signed)
Chief Complaint  Patient presents with  . Ear Pain    b/l ear pain, left is worse than right x 2 weeks. Does get frequent vertigo and has been swimming a lot lately.   Marland Kitchen Elbow Injury    has known right elbow tendonitis-having a slight flare up. Right wrist has been swollen and she would like for you to look at. "Hurts to lift a coffee cup."   She is complaining of ear pain x 2 weeks.  It is getting a little better. She feels congested below her ears. She tried Mucinex DM for several days without improvement, and ears feel stopped up.  The pain that she had when she was in the water has resolved.  When she leans over to the left, it doesn't feel right. She denies vertigo, but she feels like her head is congested and her balance is off. Denies any nasal congestion, runny nose, sinus pain, sore throat. She has chronic allergies and has been compliant with her meds, maybe missed her nasacort for just a few days recently. She has some postnasal drainage and slight cough.  This is typical for her, year-round; no change in color.  She hasn't taken any decongestants.  She has been using hydrogen peroxide to clean out her ears, 3x in the last 10 days.  [place], and was wondering if she could have swimmer's ear, and if it is okay that she has used the peroxide.   She has frequent vertigo, and will miss work a few times/year due to vertigo, vomiting.  She sometimes will do the [facility] maneuvers which will help. Not currently having significant symptoms.  She has some problems swallowing larger pills, like her calcium.  She had EGD and colonoscopy with Dr. Collene Mares at age 40, and it was reportedly normal.  Denies any heartburn, just some mild belching.  Denies any dysphagia to food or wtaer, just the large pills. She admits that she knows someone suffering from esophageal cancer, so it anxious about it.  She saw [name] for her right elbow pain.  She reports that she was referred to  Alm[facility] Coaster for PT.  He suggested she go back to [facility].  She still has pain at her lateral elbow when she lifts things.  She notices some popping in her right wrist, and it sometimes looks swollen. She has been getting PT--saw him just twice and has been doing home exercise regimen regularly for the last 2-3 weeks.  It hasn't been improving.  She has been wearing the Body Helix compression sleeve (preferred by Dr. Barbaraann Barthel over the strap). No numbness, tingling, weakness.  Review of chart--saw [name] 4/27 and had cortisone injection.  She had completely forgotten about the injection.  Sleep apnea.  Diagnosed when she was being seen at [facility], maybe 4 years ago.  She is sporadic with using CPAP, doesn't really use it all that regularly because she didn't notice much difference. She wonders if the settings are correct.  Denies headaches, some fatigue/low energy throughout the day.  She has also had problems with feet, bursitis in hip, and has taken prednisone in the past, but that never seems to help. She had trochanteric bursa injection earlier this month. She doesn't find NSAIDs all that effective either  She has a goiter.  Review of chart shows multiple nodules, left more prominent than right.  H/o benign biopsy (per radiology report).  Repeat u/s 1 year.  Past Medical  History  Diagnosis Date  . Asthma   . HSV-2 (herpes simplex virus 2) infection   . Anxiety     Dr. [name]  . Depression   . Insomnia   . Vaginal dryness   . Bronchitis   . OSA (obstructive sleep apnea)     CPAP (uses sporadically)  . Hyperlipidemia   . Multiple thyroid nodules 03/2014    more prominent on L>R; h/o benign bx. f/u 1 yr  . Lateral epicondylitis right; 09/2014    Dr. [name]  . Trochanteric bursitis 01/2015    Past Surgical History  Procedure Laterality Date  . Cryotherapy    . Hernia repair  age 56    LIH  . Knee surgery Right     twice--Dr. Onnie Graham, Dr. Lynann Bologna (25 years ago)   . Hysterotomy    . Combined hysterectomy vaginal / oophorectomy / a&p repair  2008    pt states she has her ovaries  . Dilation and curettage of uterus      History   Social History  . Marital Status: Married    Spouse Name: N/A  . Number of Children: N/A  . Years of Education: N/A   Occupational History  . Not on file.   Social History Main Topics  . Smoking status: Never Smoker   . Smokeless tobacco: Never Used  . Alcohol Use: 0.0 oz/week    0 Standard drinks or equivalent per week     Comment: occasional (up to 4/week, not every week)  . Drug Use: No  . Sexual Activity: Yes    Birth Control/ Protection: Surgical     Comment: hysterectomy    Other Topics Concern  . Not on file   Social History Narrative   Married.  2 daughters (one of whom has 2 rats).  Oldest is in college.   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 Bone cancer Paternal Grandmother   . Stroke Maternal Grandmother   . Heart attack Maternal Grandfather   . Stomach cancer Maternal Grandfather   . Heart attack Father 42    heart attacks at 42 and 13  . Alcohol abuse Father   . Hyperlipidemia Father   . Heart disease Mother   . Depression Mother   . Colon cancer Maternal Uncle   . Depression Sister   . Alcohol abuse Brother   . Hyperlipidemia Brother   . Anxiety disorder Brother   . Mental illness Daughter   . Alcohol abuse Sister   . Hyperlipidemia Sister   . Depression Sister   . Anxiety disorder Sister   . Diabetes Maternal Uncle     Outpatient Encounter Prescriptions as of 01/30/2015  Medication Sig Note  . Calcium Carbonate-Vitamin D (CALCIUM PLUS VITAMIN D PO) Take 1 tablet by mouth daily. 01/30/2015: About 3 times a week.   . cetirizine (ZYRTEC) 10 MG tablet Take 10 mg by mouth daily.   Marland Kitchen estradiol (VIVELLE-DOT) 0.05 MG/24HR patch PLACE ONTO SKIN AND CHANGE TWICE A WEEK   . FLUoxetine (PROZAC) 40 MG capsule Take 1 capsule (40  mg total) by mouth daily.   . mometasone-formoterol (DULERA) 100-5 MCG/ACT AERO INHALE TWO PUFFS INTO LUNGS TWICE DAILY   . montelukast (SINGULAIR) 10 MG tablet Take 1 tablet (10 mg total) by mouth at bedtime.   . MULTIPLE VITAMIN PO Take by mouth.   . Omega-3 Fatty Acids (FISH OIL) 600  MG CAPS Take by mouth.   . pravastatin (PRAVACHOL) 40 MG tablet Take 1 tablet (40 mg total) by mouth daily.   . traZODone (DESYREL) 150 MG tablet Take 1 tablet (150 mg total) by mouth at bedtime. 01/30/2015: 50-100mg  at bedtime (scored tablet)  . triamcinolone (NASACORT) 55 MCG/ACT AERO nasal inhaler Place 2 sprays into the nose daily.   . TURMERIC PO Take 1 capsule by mouth daily.   . valACYclovir (VALTREX) 500 MG tablet Take one daily   . albuterol (PROVENTIL HFA;VENTOLIN HFA) 108 (90 BASE) MCG/ACT inhaler Inhale 2 puffs into the lungs every 6 (six) hours as needed for wheezing or shortness of breath. (Patient not taking: Reported on 01/30/2015)   . ALPRAZolam (XANAX) 0.5 MG tablet Take 1 tablet (0.5 mg total) by mouth at bedtime as needed for anxiety. (Patient not taking: Reported on 01/30/2015) 01/30/2015: Only needs a few times/year  . [DISCONTINUED] Albuterol (VENTOLIN IN) Inhale into the lungs.    No facility-administered encounter medications on file as of 01/30/2015.    Allergies  Allergen Reactions  . Codeine    ROS:  No fever, chills.  +mild congestion and ear pain. No hearing loss.  Intermittent vertigo. No nausea, vomiting, reflux.  +trouble swallowing large pills; no dysphagia to food, liquids.  Normal bowels, no urinary complaints.  +right elbow pain; hip pain has improved. No chest pain, palpitations, bleeding, bruising, rash or other complaints.  See HPI.  Moods are well controlled.  PHYSICAL EXAM:  BP 110/62 mmHg  Pulse 64  Temp(Src) 97.7 F (36.5 C) (Tympanic)  Ht 5' 6.5" (1.689 m)  Wt 185 lb (83.915 kg)  BMI 29.42 kg/m2 Well developed, pleasant female, in no distress HEENT: PERRL, EOMI,  conjunctiva clear.  TM's and EAC's are completely normal Nasal mucosa mild-mod edematous, pale, no purulence.  She has mild discomfort over maxillary sinuses bilaterally; nontender over mastoid bones OP is clear Neck: no lymphadenopathy or mass Heart: regular rate and rhythm without murmur Lungs: clear bilaterally.  Good air movement, no wheezes, rales, ronchi Back: no spinal or CVA tenderness Abdomen: soft, nontender, no organomegaly or mass Extremities: no edema, 2+ pulses. Right upper extremity: Pain with supination against resistance Tender over right lateral epicondyle She had pain with flexion against resistance of the wrist--in the elbow and at the wrist Neuro: normal strength, sensation, gait, cranial nerves, DTR's Skin: no rashes, bruising Psych: normal mood, affect, hygiene and grooming  ASSESSMENT/PLAN  Lateral epicondylitis (tennis elbow), right - rx strength of OTC NSAIDs and f/u with Dr. Barbaraann Barthel for further treatment (?nitro, injection)  OSA on CPAP - pt to get results of sleep study faxed to [place]; consider home titration study through [facility]  Otalgia of both ears - reassured no infection.  Suspect ETD related to allergies. decongestants prn, continue allergy meds  Allergic rhinitis, unspecified allergic rhinitis type - continue nasal steroid, antihistamine, singulair. add decongestants and mucinex prn  Asthma in remission  Pill dysphagia - Mild, with certain pills. Reviewed other s/sx of dysphagia and f/u if worsens.  Dysphagia--reassurred. Discussed other calcium/D options, take with more water, later in morning, when mouth isn't as dry.  Your ear discomfort may be related to sinus congestion and eustachian tube dysfunction.  Continue the regular use of nasacort.  Add sudafed for the next few days and see if that helps.  There is no evidence of any kind of infection.  Follow up with Dr. Barbaraann Barthel for the elbow pain (lateral epicondylitis).  He may have  additional therapeutic treatments that can be helpful. In the interim continue icing it, rest when possible, and restart ibuprofen 800mg  three times daily with food (or aleve 2 pills twice daily with food).  White vinegar and alcohol 50/50 mix, 3-4 drops into each ear after swimming can help prevent swimmer's ear (you don't need to do this now).  Try and get results of sleep study faxed here. We may then be able to arrange for a home autotitration study through [facility].  Let us know if you have ongoing/worsening problems with swallowing--ie problems with food, liquids, pain with swallowing.  You may want to take your pills at a different time, maybe when mouth is less dry, and with more water.  Versus trying smaller pills (ie Viactiv chews or gummy's for some of the vitamins).

## 2015-02-20 ENCOUNTER — Encounter: Payer: Self-pay | Admitting: *Deleted

## 2015-02-27 ENCOUNTER — Other Ambulatory Visit: Payer: Self-pay | Admitting: Radiology

## 2015-02-27 LAB — HM MAMMOGRAPHY

## 2015-03-18 ENCOUNTER — Encounter: Payer: Self-pay | Admitting: Sports Medicine

## 2015-03-18 ENCOUNTER — Ambulatory Visit (INDEPENDENT_AMBULATORY_CARE_PROVIDER_SITE_OTHER): Payer: Commercial Managed Care - PPO | Admitting: Sports Medicine

## 2015-03-18 VITALS — BP 104/72 | Ht 66.5 in | Wt 178.0 lb

## 2015-03-18 DIAGNOSIS — M7071 Other bursitis of hip, right hip: Secondary | ICD-10-CM | POA: Diagnosis not present

## 2015-03-18 DIAGNOSIS — M25579 Pain in unspecified ankle and joints of unspecified foot: Secondary | ICD-10-CM | POA: Insufficient documentation

## 2015-03-18 DIAGNOSIS — M7711 Lateral epicondylitis, right elbow: Secondary | ICD-10-CM | POA: Insufficient documentation

## 2015-03-18 NOTE — Assessment & Plan Note (Signed)
This is recurrent  I think we have to correct the Glut med weakness Work on 1 foot balance Keep up PT with [facility]

## 2015-03-18 NOTE — Assessment & Plan Note (Signed)
She has orthotics but needs new ones as these are breaking down  Needs more cushion  Needs MT correction

## 2015-03-18 NOTE — Assessment & Plan Note (Signed)
We will start with a more significant rehab plan  Add rotations Extensions Ball squueezes  Reck 6 wks

## 2015-03-18 NOTE — Progress Notes (Signed)
Patient ID: Debra Hardin, female   DOB: [birth year], 54 y.o.   MRN: [id number]  Patient with 2 problems  RT Tennis elbow Improved for a period of time after CSI by Dr Barbaraann Barthel Doing exercise per Katrine Coho Still having sxs that make her want to use left arm for lifting Comp hels  RT GT bursitis CSI by DR Hudnall again helped for several weeks However this has recurred for years Exercises through PT do help her be more active Still getting pain after standing or walking too much  Foot issues Dr Scherrie November put her in rigid orthotics several years ago These help However, needed MT pad added Feels foot pain makes hip worse  PEXAM NAD BP 104/72 mmHg  Ht 5' 6.5" (1.689 m)  Wt 178 lb (80.74 kg)  BMI 28.30 kg/m2  RT elbow hyperextendsTTP over lat epi + book test Pain with resisted extension wrist or fingers  Other HM tests: palms to floor/ both elbows HE/ Lt knee hyperextends - fingers and thumbs - norm  Rt hip full ROM TTP over GT Weak over Glut med  Left is week over Glut med  Other hip mm are strong  Week on 1 foot balance  Feet show first MT insufficiency Hallux rigidus Sinus tarsi Pes planus Morton's change MT ttp

## 2015-04-29 ENCOUNTER — Encounter: Payer: Self-pay | Admitting: Sports Medicine

## 2015-04-29 ENCOUNTER — Ambulatory Visit (INDEPENDENT_AMBULATORY_CARE_PROVIDER_SITE_OTHER): Payer: Commercial Managed Care - PPO | Admitting: Sports Medicine

## 2015-04-29 VITALS — BP 126/82 | Ht 66.5 in | Wt 178.0 lb

## 2015-04-29 DIAGNOSIS — M25579 Pain in unspecified ankle and joints of unspecified foot: Secondary | ICD-10-CM

## 2015-04-29 DIAGNOSIS — R269 Unspecified abnormalities of gait and mobility: Secondary | ICD-10-CM | POA: Insufficient documentation

## 2015-04-29 NOTE — Progress Notes (Signed)
  Debra Hardin - 54 y.o. female MRN [id number]  Date of birth: 16-Apr-1961    SUBJECTIVE:     Debra Hardin comes in today to have custom shoe orthotics made.  Reports long-standing bilateral foot pain and has been through multiple pairs of shoes orthotics with some benefit.  Psin is worth on left forefoot. Previous rigid orthotics made by Barbaraann Barthel.  She reports bilateral foot pain daily.  Pain is greatest in her forefoot and greater left than right.  Reports shooting pains through second and third rays after long day of walking.  Also reports right lateral hip pain since she has been compensating for left foot pain.  Denies any acute trauma or previous foot surgeries.   ROS:     No gen.Joint swelling/ hx of depression but stable/ menopausal sxs  PERTINENT  PMH / PSH FH / / SH:  Past Medical, Surgical, Social, and Family History Reviewed & Updated in the EMR.  Pertinent findings include:   OBJECTIVE: BP 126/82 mmHg  Ht 5' 6.5" (1.689 m)  Wt 178 lb (80.74 kg)  BMI 28.30 kg/m2  Physical Exam:  Vital signs are reviewed. Left Foot: Pes planus with mild hindfoot valgus; third ray slightly longer than first; complete collapse of transverse arch; mild bunion deformity.   Right Foot: Pes planus with neutral hindfoot; some transverse arch remains; no bunion deformity Bilat hallux rigidus Spurring dorsum on MTP 1 bilat   ASSESSMENT & PLAN:  Pain in joint, ankle and foot Bilateral foot pain left greater than right.  Metatarsalgia of left foot with mild bunion deformity.  Custom orthotics made today with bilateral first ray post and metatarsal pad for left foot - Instructed on home excess program: Continue hip abduction exercises due to right greater trochanteric pain syndrome due to compensation from left foot pain; advised walking on foam to strength and intrinsic muscles - Follow-up in 2-3 months for reassessment

## 2015-04-29 NOTE — Assessment & Plan Note (Signed)
Bilateral foot pain left greater than right.  Metatarsalgia of left foot with mild bunion deformity.  Custom orthotics made today with bilateral first ray post and metatarsal pad for left foot - Instructed on home excess program: Continue hip abduction exercises due to right greater trochanteric pain syndrome due to compensation from left foot pain; advised walking on foam to strength and intrinsic muscles - Follow-up in 2-3 months for reassessment

## 2015-04-29 NOTE — Assessment & Plan Note (Signed)
Patient was fitted for a : standard, cushioned, semi-rigid orthotic. The orthotic was heated and afterward the patient stood on the orthotic blank positioned on the orthotic stand. The patient was positioned in subtalar neutral position and 10 degrees of ankle dorsiflexion in a weight bearing stance. After completion of molding, a stable base was applied to the orthotic blank. The blank was ground to a stable position for weight bearing. Size: 8 red EVA Base: blue med density EVA Posting:first ray Additional orthotic padding::  MT pads  Gait post correction was corrected to level of minimal pronation and felt pain relief.  Face to face time was 40 mins with > 50% spent on counseling and advice regarding gait and foot pain

## 2015-05-01 ENCOUNTER — Encounter: Payer: Commercial Managed Care - PPO | Admitting: Sports Medicine

## 2015-05-26 ENCOUNTER — Ambulatory Visit (INDEPENDENT_AMBULATORY_CARE_PROVIDER_SITE_OTHER): Payer: Commercial Managed Care - PPO | Admitting: Family Medicine

## 2015-05-26 ENCOUNTER — Encounter: Payer: Self-pay | Admitting: Family Medicine

## 2015-05-26 VITALS — BP 114/77 | HR 85 | Ht 67.0 in | Wt 178.0 lb

## 2015-05-26 DIAGNOSIS — M25521 Pain in right elbow: Secondary | ICD-10-CM | POA: Diagnosis not present

## 2015-05-26 DIAGNOSIS — M7711 Lateral epicondylitis, right elbow: Secondary | ICD-10-CM | POA: Diagnosis not present

## 2015-05-26 DIAGNOSIS — M25562 Pain in left knee: Secondary | ICD-10-CM | POA: Diagnosis not present

## 2015-05-26 MED ORDER — DICLOFENAC SODIUM 75 MG PO TBEC: 75.0000 mg | DELAYED_RELEASE_TABLET | Freq: Two times a day (BID) | ORAL | Status: DC

## 2015-05-26 MED ORDER — NITROGLYCERIN 0.2 MG/HR TD PT24: MEDICATED_PATCH | TRANSDERMAL | Status: DC

## 2015-05-26 NOTE — Patient Instructions (Signed)
You have lateral epicondylitis Try to avoid painful activities as much as possible. Ice the area 3-4 times a day for 15 minutes at a time. Tylenol or ibuprofen as needed for pain. Elbow sleeve as directed can help unload area - wear this regularly if it provides you with relief. Hammer rotation exercise, wrist extension exercise with 1 pound weight - 3 sets of 10 once a day.   Stretching - hold for 20-30 seconds and repeat 3 times. Start physical therapy, nitro patches (1/4th patch over affected elbow, change daily). Consider repeat injection if not improving. Follow up in 6 weeks.  Your knee pain is consistent with arthritis primarily. These are the 4 classes of medicine you can use for this: Tylenol 500mg  1-2 tabs three times a day for pain. Ibuprofen 600mg  three times a day with food (Or you can try the prescription anti-inflammatory diclofenac I sent to your pharmacy). Glucosamine sulfate 750mg  twice a day is a supplement that may help. Capsaicin topically up to four times a day may also help with pain. Cortisone injections are an option. It's important that you continue to stay active. Straight leg raises, knee extensions 3 sets of 10 once a day (add ankle weight if these become too easy). Start physical therapy to strengthen muscles around the joint that hurts to take pressure off of the joint itself. Heat or ice 15 minutes at a time 3-4 times a day as needed to help with pain. Follow up with me in 6 weeks for reevaluation.

## 2015-06-02 DIAGNOSIS — M25562 Pain in left knee: Secondary | ICD-10-CM | POA: Insufficient documentation

## 2015-06-02 NOTE — Progress Notes (Signed)
PCP: Kelton Pillar, MD  Subjective:   HPI: Patient is a 54 y.o. female here for right elbow pain, left knee pain  4/28: Patient reports for about 2 months she's had worsening lateral right elbow pain into the forearm. Difficulty typing, using coffee cup. Has been icing, taking ibuprofen. No known injury or trauma. Pain level 3/10 currently. Some localized swelling. Impacting her ability to play guitar. Right handed.  11/21: Patient reports her right elbow has been hurting laterally still at 4/10 level. Feels like a soreness. Tried home exercises, injection at last office visit with improvement but has worsened again. Worse with wrist motions, opening doors. Left knee has hurt for 1 month Worse going up and down stairs, soreness medially. Hard to straighten out leg. Pain level 5/10. No skin changes, fever, other complaints.  Past Medical History  Diagnosis Date  . Asthma   . HSV-2 (herpes simplex virus 2) infection   . Anxiety     Dr. [name]  . Depression   . Insomnia   . Vaginal dryness   . Bronchitis   . OSA (obstructive sleep apnea)     CPAP (uses sporadically)  . Hyperlipidemia   . Multiple thyroid nodules 03/2014    more prominent on L>R; h/o benign bx. f/u 1 yr  . Lateral epicondylitis right; 09/2014    Dr. [name]  . Trochanteric bursitis 01/2015    Current Outpatient Prescriptions on File Prior to Visit  Medication Sig Dispense Refill  . albuterol (PROVENTIL HFA;VENTOLIN HFA) 108 (90 BASE) MCG/ACT inhaler Inhale 2 puffs into the lungs every 6 (six) hours as needed for wheezing or shortness of breath. (Patient not taking: Reported on 01/30/2015) 1 Inhaler 1  . ALPRAZolam (XANAX) 0.5 MG tablet Take 1 tablet (0.5 mg total) by mouth at bedtime as needed for anxiety. (Patient not taking: Reported on 01/30/2015) 20 tablet 0  . Calcium Carbonate-Vitamin D (CALCIUM PLUS VITAMIN D PO) Take 1 tablet by mouth daily.    . cetirizine (ZYRTEC) 10 MG tablet Take  10 mg by mouth daily.    Marland Kitchen estradiol (VIVELLE-DOT) 0.05 MG/24HR patch PLACE ONTO SKIN AND CHANGE TWICE A WEEK 8 patch 5  . FLUoxetine (PROZAC) 40 MG capsule Take 1 capsule (40 mg total) by mouth daily. 90 capsule 1  . mometasone-formoterol (DULERA) 100-5 MCG/ACT AERO INHALE TWO PUFFS INTO LUNGS TWICE DAILY 1 Inhaler 2  . montelukast (SINGULAIR) 10 MG tablet Take 1 tablet (10 mg total) by mouth at bedtime. 90 tablet 1  . MULTIPLE VITAMIN PO Take by mouth.    . Omega-3 Fatty Acids (FISH OIL) 600 MG CAPS Take by mouth.    . pravastatin (PRAVACHOL) 40 MG tablet Take 1 tablet (40 mg total) by mouth daily. 90 tablet 1  . traZODone (DESYREL) 150 MG tablet Take 1 tablet (150 mg total) by mouth at bedtime. 90 tablet 1  . triamcinolone (NASACORT) 55 MCG/ACT AERO nasal inhaler Place 2 sprays into the nose daily.    . TURMERIC PO Take 1 capsule by mouth daily.    . valACYclovir (VALTREX) 500 MG tablet Take one daily 90 tablet 1   No current facility-administered medications on file prior to visit.    Past Surgical History  Procedure Laterality Date  . Cryotherapy    . Hernia repair  age 15    LIH  . Knee surgery Right     twice--Dr. Onnie Graham, Dr. Lynann Bologna (25 years ago)  . Hysterotomy    . Combined hysterectomy  vaginal / oophorectomy / a&p repair  2008    pt states she has her ovaries  . Dilation and curettage of uterus      Allergies  Allergen Reactions  . Codeine     Social History   Social History  . Marital Status: Married    Spouse Name: N/A  . Number of Children: N/A  . Years of Education: N/A   Occupational History  . Not on file.   Social History Main Topics  . Smoking status: Never Smoker   . Smokeless tobacco: Never Used  . Alcohol Use: 0.0 oz/week    0 Standard drinks or equivalent per week     Comment: occasional (up to 4/week, not every week)  . Drug Use: No  . Sexual Activity: Yes    Birth Control/ Protection: Surgical     Comment: hysterectomy    Other Topics  Concern  . Not on file   Social History Narrative   Married.  2 daughters (one of whom has 2 rats).  Oldest is in college.   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 Bone cancer Paternal Grandmother   . Stroke Maternal Grandmother   . Heart attack Maternal Grandfather   . Stomach cancer Maternal Grandfather   . Heart attack Father 42    heart attacks at 3 and 30  . Alcohol abuse Father   . Hyperlipidemia Father   . Heart disease Mother   . Depression Mother   . Colon cancer Maternal Uncle   . Depression Sister   . Alcohol abuse Brother   . Hyperlipidemia Brother   . Anxiety disorder Brother   . Mental illness Daughter   . Alcohol abuse Sister   . Hyperlipidemia Sister   . Depression Sister   . Anxiety disorder Sister   . Diabetes Maternal Uncle     BP 114/77 mmHg  Pulse 85  Ht 5\' 7"  (1.702 m)  Wt 178 lb (80.74 kg)  BMI 27.87 kg/m2  Review of Systems: See HPI above.    Objective:  Physical Exam:  Gen: NAD  Right elbow: No gross deformity, swelling, bruising. TTP lateral epicondyle and just distal to this.   FROM with pain on resisted wrist extension, pronation. Collateral ligaments intact. NVI distally.  Left elbow: FROM without pain.  Left knee: No gross deformity, ecchymoses, swelling. TTP medial > lateral joint line. FROM. Negative ant/post drawers. Negative valgus/varus testing. Negative lachmanns. Negative mcmurrays, apleys, patellar apprehension. NV intact distally.  Right knee: FROM without pain.    Assessment & Plan:  1. Right lateral epicondylitis - shown home exercises to do again.  S/p injection in April.  Start physical therapy and nitro patches (discussed risks of headache, skin irritation).  Compression sleeve.  Icing as needed.  F/u in 6 weeks.  2. Left knee pain - 2/2 DJD.  Discussed tylenol, nsaids, glucosamine, topical medications.  Start physical therapy for this as well.   Consider injection if not improving.  F/u in 6 weeks.

## 2015-06-02 NOTE — Assessment & Plan Note (Signed)
shown home exercises to do again.  S/p injection in April.  Start physical therapy and nitro patches (discussed risks of headache, skin irritation).  Compression sleeve.  Icing as needed.  F/u in 6 weeks.

## 2015-06-02 NOTE — Assessment & Plan Note (Signed)
2/2 DJD.  Discussed tylenol, nsaids, glucosamine, topical medications.  Start physical therapy for this as well.  Consider injection if not improving.  F/u in 6 weeks.

## 2015-06-08 ENCOUNTER — Ambulatory Visit (INDEPENDENT_AMBULATORY_CARE_PROVIDER_SITE_OTHER): Payer: Commercial Managed Care - PPO | Admitting: Family Medicine

## 2015-06-08 VITALS — BP 112/70 | HR 83 | Temp 98.7°F | Resp 16 | Ht 69.0 in | Wt 185.6 lb

## 2015-06-08 DIAGNOSIS — J040 Acute laryngitis: Secondary | ICD-10-CM | POA: Diagnosis not present

## 2015-06-08 MED ORDER — AZITHROMYCIN 250 MG PO TABS: ORAL_TABLET | ORAL | Status: DC

## 2015-06-08 MED ORDER — METHYLPREDNISOLONE ACETATE 80 MG/ML IJ SUSP
80.0000 mg | Freq: Once | INTRAMUSCULAR | Status: AC
  Administered 2015-06-08: 80 mg via INTRAMUSCULAR

## 2015-06-08 NOTE — Patient Instructions (Signed)
Laryngitis  Laryngitis is inflammation of your vocal cords. This causes hoarseness, coughing, loss of voice, sore throat, or a dry throat. Your vocal cords are two bands of muscles that are found in your throat. When you speak, these cords come together and vibrate. These vibrations come out through your mouth as sound. When your vocal cords are inflamed, your voice sounds different.  Laryngitis can be temporary (acute) or long-term (chronic). Most cases of acute laryngitis improve with time. Chronic laryngitis is laryngitis that lasts for more than three weeks.  CAUSES  Acute laryngitis may be caused by:   A viral infection.   Lots of talking, yelling, or singing. This is also called vocal strain.   Bacterial infections.  Chronic laryngitis may be caused by:   Vocal strain.   Injury to your vocal cords.   Acid reflux (gastroesophageal reflux disease or GERD).   Allergies.   Sinus infection.   Smoking.   Alcohol abuse.   Breathing in chemicals or dust.   Growths on the vocal cords.  RISK FACTORS  Risk factors for laryngitis include:   Smoking.   Alcohol abuse.   Having allergies.  SIGNS AND SYMPTOMS  Symptoms of laryngitis may include:   Low, hoarse voice.   Loss of voice.   Dry cough.   Sore throat.   Stuffy nose.  DIAGNOSIS  Laryngitis may be diagnosed by:   Physical exam.   Throat culture.   Blood test.   Laryngoscopy. This procedure allows your health care provider to look at your vocal cords with a mirror or viewing tube.  TREATMENT  Treatment for laryngitis depends on what is causing it. Usually, treatment involves resting your voice and using medicines to soothe your throat. However, if your laryngitis is caused by a bacterial infection, you may need to take antibiotic medicine. If your laryngitis is caused by a growth, you may need to have a procedure to remove it.  HOME CARE INSTRUCTIONS   Drink enough fluid to keep your urine clear or pale yellow.   Breathe in moist air. Use a  humidifier if you live in a dry climate.   Take medicines only as directed by your health care provider.   If you were prescribed an antibiotic medicine, finish it all even if you start to feel better.   Do not smoke cigarettes or electronic cigarettes. If you need help quitting, ask your health care provider.   Talk as little as possible. Also avoid whispering, which can cause vocal strain.   Write instead of talking. Do this until your voice is back to normal.  SEEK MEDICAL CARE IF:   You have a fever.   You have increasing pain.   You have difficulty swallowing.  SEEK IMMEDIATE MEDICAL CARE IF:   You cough up blood.   You have trouble breathing.     This information is not intended to replace advice given to you by your health care provider. Make sure you discuss any questions you have with your health care provider.     Document Released: 06/21/2005 Document Revised: 07/12/2014 Document Reviewed: 12/04/2013  Elsevier Interactive Patient Education 2016 [employer]vier Inc.

## 2015-06-08 NOTE — Progress Notes (Signed)
@[email or web address] @  By signing my name below, I, Raven Small, attest that this documentation has been prepared under the direction and in the presence of Robyn Haber, MD.  Electronically Signed: Thea Alken, ED Scribe. 06/08/2015. 11:40 AM.  Patient ID: Debra Hardin MRN: [id number], DOB: June 13, 1961, 54 y.o. Date of Encounter: 06/08/2015, 11:39 AM  Primary Physician: Kelton Pillar, MD  Chief Complaint:  Chief Complaint  Patient presents with  . Cough    started  friday  . Nasal Congestion  . Ear Pain  . Sore Throat  . Headache    HPI: 54 y.o. year old female with history below presents with a productive cough with green and clear mucous that began 2 days ago. Pt has had worsening hoarseness, nasal congestion, otalgia sore throat and headache. She began taking a decongestant yesterday after having vertigo episodes yesterday. She denies wheezing.   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 Asthma   . HSV-2 (herpes simplex virus 2) infection   . Anxiety     Dr. [name]  . Depression   . Insomnia   . Vaginal dryness   . Bronchitis   . OSA (obstructive sleep apnea)     CPAP (uses sporadically)  . Hyperlipidemia   . Multiple thyroid nodules 03/2014    more prominent on L>R; h/o benign bx. f/u 1 yr  . Lateral epicondylitis right; 09/2014    Dr. [name]  . Trochanteric bursitis 01/2015     Home Meds: Prior to Admission medications   Medication Sig Start Date End Date Taking? Authorizing Provider  albuterol (PROVENTIL HFA;VENTOLIN HFA) 108 (90 BASE) MCG/ACT inhaler Inhale 2 puffs into the lungs every 6 (six) hours as needed for wheezing or shortness of breath. 10/30/14  Yes [name], MD  Calcium Carbonate-Vitamin D (CALCIUM PLUS VITAMIN D PO) Take 1 tablet by mouth daily.   Yes Historical Provider, MD  cetirizine (ZYRTEC) 10 MG tablet Take 10 mg by mouth daily.    Yes Historical Provider, MD  diclofenac (VOLTAREN) 75 MG EC tablet Take 1 tablet (75 mg total) by mouth 2 (two) times daily. 05/26/15  Yes Dene Gentry, MD  estradiol (VIVELLE-DOT) 0.05 MG/24HR patch PLACE ONTO SKIN AND CHANGE TWICE A WEEK 10/30/14  Yes [name], MD  FLUoxetine (PROZAC) 40 MG capsule Take 1 capsule (40 mg total) by mouth daily. 10/30/14  Yes [name], MD  mometasone-formoterol (DULERA) 100-5 MCG/ACT AERO INHALE TWO PUFFS INTO LUNGS TWICE DAILY 10/30/14  Yes [name], MD  montelukast (SINGULAIR) 10 MG tablet Take 1 tablet (10 mg total) by mouth at bedtime. 10/30/14  Yes [name], MD  MULTIPLE VITAMIN PO Take by mouth.   Yes Historical Provider, MD  Omega-3 Fatty Acids (FISH OIL) 600 MG CAPS Take by mouth.   Yes Historical Provider, MD  pravastatin (PRAVACHOL) 40 MG tablet Take 1 tablet (40 mg total) by mouth daily. 10/30/14  Yes [name], MD  traZODone (DESYREL) 150 MG tablet Take 1 tablet (150 mg total) by mouth at bedtime. 10/30/14  Yes [name], MD  triamcinolone (NASACORT) 55 MCG/ACT AERO nasal inhaler Place 2 sprays into the nose daily.   Yes Historical Provider, MD  TURMERIC PO Take 1 capsule by mouth daily.   Yes Historical Provider, MD  valACYclovir (VALTREX) 500 MG tablet Take one daily 10/30/14  Yes [name], MD  ALPRAZolam Duanne Moron) 0.5 MG tablet Take 1 tablet (0.5 mg total) by mouth at bedtime as needed for anxiety. Patient not taking: Reported on 06/08/2015 01/23/14   [name], MD  nitroGLYCERIN (NITRODUR - DOSED IN MG/24 HR) 0.2 mg/hr patch Apply 1/4th patch to affected elbow, change daily Patient not taking: Reported on 06/08/2015 05/26/15   Dene Gentry, MD    Allergies:  Allergies  Allergen Reactions  . Codeine     Social History   Social History  . Marital Status: Married    Spouse Name: N/A  . Number of Children: N/A  . Years of Education: N/A   Occupational  History  . Not on file.   Social History Main Topics  . Smoking status: Never Smoker   . Smokeless tobacco: Never Used  . Alcohol Use: 0.0 oz/week    0 Standard drinks or equivalent per week     Comment: occasional (up to 4/week, not every week)  . Drug Use: No  . Sexual Activity: Yes    Birth Control/ Protection: Surgical     Comment: hysterectomy    Other Topics Concern  . Not on file   Social History Narrative   Married.  2 daughters (one of whom has 2 rats).  Oldest is in college.   She works as a Social worker at [employer]: Constitutional: negative for chills, fever, night sweats, weight changes, or fatigue  HEENT: negative for vision changes, hearing loss, rhinorrhea, ST, epistaxis, or sinus pressure positive for congestion, Cardiovascular: negative for chest pain or palpitations Respiratory: negative for hemoptysis, wheezing, shortness of breath. Positive for cough Abdominal: negative for abdominal pain, nausea, vomiting, diarrhea, or constipation Dermatological: negative for rash Neurologic: negative for headache, dizziness, or syncope All other systems reviewed and are otherwise negative with the exception to those above and in the HPI.   Physical Exam: Blood pressure 112/70, pulse 83, temperature 98.7 F (37.1 C), temperature source Oral, resp. rate 16, height 5\' 9"  (1.753 m), weight 185 lb 9.6 oz (84.188 kg), SpO2 98 %., Body mass index is 27.4 kg/(m^2). General: Well developed, well nourished, in no acute distress. Head: Normocephalic, atraumatic, eyes without discharge, sclera non-icteric, nares are without discharge. Bilateral auditory canals clear, TM's are without perforation, pearly grey and translucent with reflective cone of light bilaterally. Oral cavity moist, posterior pharynx without exudate, erythema, peritonsillar abscess, or post nasal drip. patient is hoarse   Neck: Supple. No thyromegaly. Full ROM. No lymphadenopathy. Lungs:  Clear bilaterally to auscultation without wheezes, rales, or rhonchi. Coarse breath sounds. Breathing is unlabored. Heart: RRR with S1 S2. No murmurs, rubs, or gallops appreciated. Msk:  Strength and tone normal for age. Extremities/Skin: Warm and dry. No clubbing or cyanosis. No edema. No rashes or suspicious lesions. Neuro: Alert and oriented X 3. Moves all extremities spontaneously. Gait is normal. CNII-XII grossly in tact. Psych:  Responds to questions appropriately with a normal affect.    ASSESSMENT AND PLAN:  54 y.o. year old female with acute laryngitis This chart was scribed in my presence and reviewed by me personally.    ICD-9-CM ICD-10-CM   1. Laryngitis 464.00 J04.0 methylPREDNISolone acetate (DEPO-MEDROL) injection 80 mg     azithromycin (ZITHROMAX) 250 MG tablet      Signed, Robyn Haber, MD 06/08/2015 11:39 AM

## 2015-06-23 ENCOUNTER — Telehealth: Payer: Self-pay | Admitting: Family Medicine

## 2015-06-23 ENCOUNTER — Ambulatory Visit: Payer: Commercial Managed Care - PPO | Attending: Family Medicine

## 2015-06-23 DIAGNOSIS — M25562 Pain in left knee: Secondary | ICD-10-CM | POA: Diagnosis present

## 2015-06-23 DIAGNOSIS — G8929 Other chronic pain: Secondary | ICD-10-CM

## 2015-06-23 DIAGNOSIS — M25521 Pain in right elbow: Secondary | ICD-10-CM | POA: Insufficient documentation

## 2015-06-23 NOTE — Patient Instructions (Signed)
AROM: Elbow Flexion / Extension   With left hand palm up, gently bend elbow as far as possible. Hold 10 seconds.  Then straighten arm as far as possible.  Repeat _10___ times per set. Do ____ sets per session. Do __3__ sessions per day.  Copyright  VHI. All rights reserved.  Wrist Flexor Stretch   Keeping elbow straight, grasp left hand and slowly bend wrist back until stretch is felt. Hold __10-20__ seconds. Relax. Repeat _3___ times per set. Do ____ sets per session. Do __3__ sessions per day.  Copyright  VHI. All rights reserved.  Wrist Extensor Stretch   Keeping elbow straight, grasp left hand and slowly bend wrist forward until stretch is felt. Hold __10-20__ seconds. Relax. Repeat __3__ times per set. Do ____ sets per session. Do __3__ sessions per day.  Copyright  VHI. All rights reserved.   KNEE: Extension, Long Arc Quads - Sitting    Raise leg until knee is straight.  Hold 5 seconds _10__ reps per set, _3-4__ sets per day  Copyright  VHI. All rights reserved.  [facility] [address], [place], [place] [address] Phone # [phone] Fax [phone]

## 2015-06-23 NOTE — Therapy (Signed)
[facility] [address] [place], [place], [address] Phone: [phone]   Fax:  [phone]  Physical Therapy Evaluation  Patient Details  Name: Debra Hardin MRN: [id number] Date of Birth: [birth year] Referring Provider: [name], MD  Encounter Date: 06/23/2015      PT End of Session - 06/23/15 1052    Visit Number 1   Date for PT Re-Evaluation 08/18/15   PT Start Time 1031   PT Stop Time 1055   PT Time Calculation (min) 24 min   Activity Tolerance Patient tolerated treatment well  Pt arrived late so short treatment   Behavior During Therapy [facility] for tasks assessed/performed      Past Medical History  Diagnosis Date  . Asthma   . HSV-2 (herpes simplex virus 2) infection   . Anxiety     Dr. [name]  . Depression   . Insomnia   . Vaginal dryness   . Bronchitis   . OSA (obstructive sleep apnea)     CPAP (uses sporadically)  . Hyperlipidemia   . Multiple thyroid nodules 03/2014    more prominent on L>R; h/o benign bx. f/u 1 yr  . Lateral epicondylitis right; 09/2014    Dr. [name]  . Trochanteric bursitis 01/2015    Past Surgical History  Procedure Laterality Date  . Cryotherapy    . Hernia repair  age 51    [facility]  . Knee surgery Right     twice--Dr. Onnie Graham, Dr. Lynann Bologna (25 years ago)  . Hysterotomy    . Combined hysterectomy vaginal / oophorectomy / a&p repair  2008    pt states she has her ovaries  . Dilation and curettage of uterus      There were no vitals filed for this visit.  Visit Diagnosis:  Elbow pain, chronic, right - Plan: PT plan of care cert/re-cert  Knee pain, chronic, left - Plan: PT plan of care cert/re-cert      Subjective Assessment - 06/23/15 1031    Subjective Pt is a Rt hand dominant female who presents to PT with 6-12 month history of Rt elbow pain and chronic Lt knee pain.  MD gave pt HEP for Rt wrist including wrist extension and flexion with 2# weight.    Pertinent History Lt knee arthroscopic surgery   Limitations Standing;Walking   How long can you stand comfortably? edema in feet, fluid in back of Lt knee.     How long can you walk comfortably? edema in feet, fluid in back of Lt knee   Diagnostic tests none   Patient Stated Goals reduce pain, stand and walk with less pain   Currently in Pain? Yes   Pain Score 4    Pain Location Elbow   Pain Orientation Right   Pain Descriptors / Indicators Aching;Sharp   Pain Type Chronic pain   Pain Onset More than a month ago   Pain Frequency Constant   Aggravating Factors  use of computer, lifting a coffee cup, carrying objects.  Knee pain with standing and walking only (3-4/10)   Pain Relieving Factors rest of Rt UE, medication    Effect of Pain on Daily Activities limited use of Rt UE (50% use), pain and limitation for walking long distances            [facility] PT Assessment - 06/23/15 0001    Assessment   Medical Diagnosis Rt elbow pain, Lt knee   Referring Provider [name], MD  Onset Date/Surgical Date 06/22/14   Hand Dominance Right   Next MD Visit 07/2015   Prior Therapy none   Precautions   Precautions None   Restrictions   Weight Bearing Restrictions No   Balance Screen   Has the patient fallen in the past 6 months No   Has the patient had a decrease in activity level because of a fear of falling?  No   Is the patient reluctant to leave their home because of a fear of falling?  No   Home Environment   Living Environment Private residence   Type of [place] Access Stairs to enter   Entrance Stairs-Number of Steps [place] Two level   Prior Function   Level of Independence Independent   Vocation Full time employment   Vocation Requirements school counselor   Cognition   Overall Cognitive Status Within Functional Limits for tasks assessed   Observation/Other Assessments   Focus on Therapeutic Outcomes (FOTO)  56% limitation   Posture/Postural Control    Posture/Postural Control No significant limitations   ROM / Strength   AROM / PROM / Strength AROM;PROM;Strength   AROM   Overall AROM  Within functional limits for tasks performed   Overall AROM Comments Full knee and elbow/wrist AROM bilaterally.  Pt with Rt triceps pain and lateral epicondyle pain at end range elbow flexion and wrist flexion   Strength   Overall Strength Deficits   Overall Strength Comments Rt elbow 5/5, wrist 4+/5 with pain on resisted wrist extension, 5/5 bilateral LE strength except Lt knee flexion and extension 4+/5   Palpation   Patella mobility mild crepitus with patellar mobs on the Lt   Palpation comment Pt with palpable tenderness over Rt lateral epicondyle and proximal wrist extensor tendons and distal triceps on Rt.  No tenderness over the Lt knee    Ambulation/Gait   Ambulation/Gait Yes   Ambulation/Gait Assistance 7: Independent   Gait Pattern Within Functional Limits                           PT Education - 06/23/15 1052    Education provided Yes   Education Details wrist flexion/extension,  elbow flexion, long arc quad   Person(s) Educated Patient   Methods Explanation;Demonstration;Handout   Comprehension Verbalized understanding;Returned demonstration          PT Short Term Goals - 06/23/15 1216    PT SHORT TERM GOAL #1   Title be independent in initial HEP   Time 4   Period Weeks   Status New   PT SHORT TERM GOAL #2   Title report a 30% reduction in Rt elbow pain with use   Time 4   Period Weeks   Status New   PT SHORT TERM GOAL #3   Title use Rt UE > 75% with work and home tasks   Time 4   Period Weeks   Status New           PT Long Term Goals - 06/23/15 1027    PT LONG TERM GOAL #1   Title be independent in advanced HEP   Time 8   Period Weeks   Status New   PT LONG TERM GOAL #2   Title reduce FOTO to < or = to 40% limitation   Time 8   Period Weeks   Status New   PT LONG TERM GOAL #3  Title  report a 60% reduction in Rt elbow pain with use   Time 8   Period Weeks   Status New   PT LONG TERM GOAL #4   Title report >90% use of Rt UE with home and work tasks   Time 8   Period Weeks   Status New   PT LONG TERM GOAL #5   Title report a 50% reduction in Lt knee pain with standing and walking   Time 8   Period Weeks   Status New               Plan - 06/23/15 1055    Clinical Impression Statement Pt presents to PT with complaints of >6 month history of Rt elbow pain and Lt knee pain.  Pt reports that she has OA in the knee.  Pt is using nitroglycerin patches on the elbow and has a brace but isn't using it.  Pt with pain over Rt lateral epicondyle and proximal wrist extensor tendons.  Pt reports 50% use of Rt UE with home and work tasks.  FOTO is 56% limitation.  Pt with Lt knee pain with standing and demonstrates weakness.  Pt will benefit from skilled PT for manual and modalities to Rt eblow and Lt knee strengthening.     Pt will benefit from skilled therapeutic intervention in order to improve on the following deficits Decreased strength;Pain;Decreased activity tolerance;Difficulty walking   Rehab Potential Good   PT Frequency 2x / week   PT Duration 8 weeks   PT Treatment/Interventions ADLs/Self Care Home Management;Iontophoresis 4mg /ml Dexamethasone;Moist Heat;Therapeutic exercise;Therapeutic activities;Functional mobility training;Electrical Stimulation;Cryotherapy;Gait training;Ultrasound;Manual techniques;Neuromuscular re-education;Passive range of motion;Dry needling   PT Next Visit Plan ionto if MD agrees for Rt elbow, Lt knee strength, manual and [place] to Rt elbow   Consulted and Agree with Plan of Care Patient         Problem List Patient Active Problem List   Diagnosis Date Noted  . Left knee pain 06/02/2015  . Abnormality of gait 04/29/2015  . Lateral epicondylitis of right elbow 03/18/2015  . Pain in joint, ankle and foot 03/18/2015  . OSA on CPAP  01/30/2015  . Right hip pain 01/08/2015  . Right lateral epicondylitis 10/31/2014  . Hot flushes, perimenopausal  HT began 11/2013 10/28/2013  . Multinodular goiter 07/25/2013  . Anxiety 07/25/2013  . Asthma 07/25/2013  . DJD (degenerative joint disease) 07/25/2013  . Depression 07/25/2013  . Hyperlipidemia 07/25/2013  . S/P hysterectomy 07/25/2013  . Genital herpes 07/25/2013  . Allergic rhinitis 07/25/2013  . Sleep apnea 07/25/2013  . Urinary incontinence 07/25/2013  . Vertigo 07/25/2013  . Hip bursitis 07/25/2013    Corinthian Mizrahi, PT 06/23/2015, 12:33 PM  [facility] [address] [place], [place], [address] Phone: [phone]   Fax:  [phone]  Name: Debra Hardin MRN: [id number] Date of Birth: 08-29-1960

## 2015-06-25 ENCOUNTER — Ambulatory Visit: Payer: Commercial Managed Care - PPO

## 2015-06-25 DIAGNOSIS — M25521 Pain in right elbow: Secondary | ICD-10-CM | POA: Diagnosis not present

## 2015-06-25 DIAGNOSIS — G8929 Other chronic pain: Secondary | ICD-10-CM

## 2015-06-25 DIAGNOSIS — M25562 Pain in left knee: Secondary | ICD-10-CM

## 2015-06-25 NOTE — Therapy (Signed)
[facility] [address] [place], [place], [address] Phone: [phone]   Fax:  [phone]  Physical Therapy Treatment  Patient Details  Name: Debra Hardin MRN: [id number] Date of Birth: 1961-06-08 Referring Provider: [name], MD  Encounter Date: 06/25/2015      PT End of Session - 06/25/15 0846    Visit Number 2   Date for PT Re-Evaluation 08/18/15   PT Start Time 0800   PT Stop Time 0840   PT Time Calculation (min) 40 min   Activity Tolerance Patient tolerated treatment well   Behavior During Therapy [facility] for tasks assessed/performed      Past Medical History  Diagnosis Date  . Asthma   . HSV-2 (herpes simplex virus 2) infection   . Anxiety     Dr. [name]  . Depression   . Insomnia   . Vaginal dryness   . Bronchitis   . OSA (obstructive sleep apnea)     CPAP (uses sporadically)  . Hyperlipidemia   . Multiple thyroid nodules 03/2014    more prominent on L>R; h/o benign bx. f/u 1 yr  . Lateral epicondylitis right; 09/2014    Dr. [name]  . Trochanteric bursitis 01/2015    Past Surgical History  Procedure Laterality Date  . Cryotherapy    . Hernia repair  age 37    LIH  . Knee surgery Right     twice--Dr. Onnie Graham, Dr. Lynann Bologna (25 years ago)  . Hysterotomy    . Combined hysterectomy vaginal / oophorectomy / a&p repair  2008    pt states she has her ovaries  . Dilation and curettage of uterus      There were no vitals filed for this visit.  Visit Diagnosis:  Elbow pain, chronic, right  Knee pain, chronic, left      Subjective Assessment - 06/25/15 0806    Subjective Doing stretches for elbow and long arc quads.  No significant change.     Currently in Pain? Yes   Pain Score 4    Pain Location Elbow   Pain Orientation Right   Pain Descriptors / Indicators Aching;Sharp   Pain Type Chronic pain   Pain Onset More than a month ago   Pain Frequency Constant                          [facility] Adult PT Treatment/Exercise - 06/25/15 0001    Exercises   Exercises Wrist;Knee/Hip   Knee/Hip Exercises: Stretches   Active Hamstring Stretch Left;3 reps;20 seconds   Knee/Hip Exercises: Seated   Long Arc Quad Strengthening;Left;2 sets;10 reps   Wrist Exercises   Wrist Flexion AROM;10 reps   Wrist Extension AROM;10 reps   Modalities   Modalities Iontophoresis;Ultrasound   Ultrasound   Ultrasound Location Rt wrist extensors/forearm    Ultrasound Parameters 34mhz   Ultrasound Goals Pain   Iontophoresis   Type of Iontophoresis Dexamethasone   Location Rt elbow at lateral epicondyle  #1   Dose 1.0 cc   Time 6 hour wear patch   Manual Therapy   Manual Therapy Soft tissue mobilization   Manual therapy comments soft tissue elongation and trigger point release to Rt wrist extensor tendons and distal triceps on the Rt                PT Education - 06/25/15 0837    Education provided Yes   Education Details hamstring stretch,  ionto info   Person(s) Educated Patient   Methods Explanation;Demonstration;Handout   Comprehension Verbalized understanding;Returned demonstration          PT Short Term Goals - 06/23/15 1216    PT SHORT TERM GOAL #1   Title be independent in initial HEP   Time 4   Period Weeks   Status New   PT SHORT TERM GOAL #2   Title report a 30% reduction in Rt elbow pain with use   Time 4   Period Weeks   Status New   PT SHORT TERM GOAL #3   Title use Rt UE > 75% with work and home tasks   Time 4   Period Weeks   Status New           PT Long Term Goals - 06/23/15 1027    PT LONG TERM GOAL #1   Title be independent in advanced HEP   Time 8   Period Weeks   Status New   PT LONG TERM GOAL #2   Title reduce FOTO to < or = to 40% limitation   Time 8   Period Weeks   Status New   PT LONG TERM GOAL #3   Title report a 60% reduction in Rt elbow pain with use   Time 8   Period Weeks   Status  New   PT LONG TERM GOAL #4   Title report >90% use of Rt UE with home and work tasks   Time 8   Period Weeks   Status New   PT LONG TERM GOAL #5   Title report a 50% reduction in Lt knee pain with standing and walking   Time 8   Period Weeks   Status New               Plan - 06/25/15 [id number]    Clinical Impression Statement Pt with only 1 session after evaluation.  Ionto initiated today.  Pt is independent in HEP.  Pt with Rt lateral epicondyle and distal triceps tenderness.  Pt with 50% use of Rt UE at home.  Pt with continued Lt knee pain.  Pt will benefit from skilled PT for manual and modalities to Rt elbow and Lt knee strengthening/flexibility.   Pt will benefit from skilled therapeutic intervention in order to improve on the following deficits Decreased strength;Pain;Decreased activity tolerance;Difficulty walking   Rehab Potential Good   PT Frequency 2x / week   PT Duration 8 weeks   PT Treatment/Interventions ADLs/Self Care Home Management;Iontophoresis 4mg /ml Dexamethasone;Moist Heat;Therapeutic exercise;Therapeutic activities;Functional mobility training;Electrical Stimulation;Cryotherapy;Gait training;Ultrasound;Manual techniques;Neuromuscular re-education;Passive range of motion;Dry needling   PT Next Visit Plan Continue Ionto, Rt elbow manual and [place], Lt knee strength and flexibility   Consulted and Agree with Plan of Care Patient        Problem List Patient Active Problem List   Diagnosis Date Noted  . Left knee pain 06/02/2015  . Abnormality of gait 04/29/2015  . Lateral epicondylitis of right elbow 03/18/2015  . Pain in joint, ankle and foot 03/18/2015  . OSA on CPAP 01/30/2015  . Right hip pain 01/08/2015  . Right lateral epicondylitis 10/31/2014  . Hot flushes, perimenopausal  HT began 11/2013 10/28/2013  . Multinodular goiter 07/25/2013  . Anxiety 07/25/2013  . Asthma 07/25/2013  . DJD (degenerative joint disease) 07/25/2013  . Depression 07/25/2013  .  Hyperlipidemia 07/25/2013  . S/P hysterectomy 07/25/2013  . Genital herpes 07/25/2013  . Allergic rhinitis 07/25/2013  . Sleep  apnea 07/25/2013  . Urinary incontinence 07/25/2013  . Vertigo 07/25/2013  . Hip bursitis 07/25/2013    TAKACS,KELLY, PT 06/25/2015, 8:48 AM  [facility] [address], [place], [place], [address] Phone: [phone]   Fax:  [phone]  Name: Debra Hardin MRN: [id number] Date of Birth: 27-Oct-1960

## 2015-06-25 NOTE — Patient Instructions (Addendum)
HIP: Hamstrings - Short Sitting    Rest leg on raised surface. Keep knee straight. Lift chest. Hold _20__ seconds. _3__ reps per set, _2-3__ sets per day  Copyright  VHI. All rights reserved.  IONTOPHORESIS PATIENT PRECAUTIONS & CONTRAINDICATIONS:  . Redness under one or both electrodes can occur.  This characterized by a uniform redness that usually disappears within 12 hours of treatment. . Small pinhead size blisters may result in response to the drug.  Contact your physician if the problem persists more than 24 hours. . On rare occasions, iontophoresis therapy can result in temporary skin reactions such as rash, inflammation, irritation or burns.  The skin reactions may be the result of individual sensitivity to the ionic solution used, the condition of the skin at the start of treatment, reaction to the materials in the electrodes, allergies or sensitivity to dexamethasone, or a poor connection between the patch and your skin.  Discontinue using iontophoresis if you have any of these reactions and report to your therapist. . Remove the Patch or electrodes if you have any undue sensation of pain or burning during the treatment and report discomfort to your therapist. . Tell your Therapist if you have had known adverse reactions to the application of electrical current. . If using the Patch, the LED light will turn off when treatment is complete and the patch can be removed.  Approximate treatment time is 1-3 hours.  Remove the patch when light goes off or after 6 hours. . The Patch can be worn during normal activity, however excessive motion where the electrodes have been placed can cause poor contact between the skin and the electrode or uneven electrical current resulting in greater risk of skin irritation. Marland Kitchen Keep out of the reach of children.   . DO NOT use if you have a cardiac pacemaker or any other electrically sensitive implanted device. . DO NOT use if you have a known  sensitivity to dexamethasone. . DO NOT use during Magnetic Resonance Imaging (MRI). . DO NOT use over broken or compromised skin (e.g. sunburn, cuts, or acne) due to the increased risk of skin reaction. . DO NOT SHAVE over the area to be treated:  To establish good contact between the Patch and the skin, excessive hair may be clipped. . DO NOT place the Patch or electrodes on or over your eyes, directly over your heart, or brain. . DO NOT reuse the Patch or electrodes as this may cause burns to occur. [facility] [address] [place], [place] [address] Phone # [phone] Fax [phone]

## 2015-06-27 ENCOUNTER — Telehealth: Payer: Self-pay | Admitting: Family Medicine

## 2015-06-27 DIAGNOSIS — J4520 Mild intermittent asthma, uncomplicated: Secondary | ICD-10-CM

## 2015-06-27 MED ORDER — VALACYCLOVIR HCL 500 MG PO TABS: ORAL_TABLET | ORAL | Status: AC

## 2015-06-27 MED ORDER — MONTELUKAST SODIUM 10 MG PO TABS: 10.0000 mg | ORAL_TABLET | Freq: Every day | ORAL | Status: DC

## 2015-06-27 NOTE — Telephone Encounter (Signed)
Refills were done for 90 days with a refill. Message didn't say which pharmacy, [facility] was in system, so sent there.  Please call patient and schedule CPE (due in April)

## 2015-06-27 NOTE — Telephone Encounter (Signed)
Fax refill requests for  Valtrex 500 mg # 90  Last fill date 05/25/15  For # 30   Montelukast 10 mg # 90  Take 1 tablet at bedtime  Last filled 05/25/15 for # 30

## 2015-07-02 ENCOUNTER — Other Ambulatory Visit: Payer: Self-pay

## 2015-07-02 ENCOUNTER — Telehealth: Payer: Self-pay | Admitting: Family Medicine

## 2015-07-02 MED ORDER — MOMETASONE FURO-FORMOTEROL FUM 100-5 MCG/ACT IN AERO: INHALATION_SPRAY | RESPIRATORY_TRACT | Status: DC

## 2015-07-02 MED ORDER — ESTRADIOL 0.05 MG/24HR TD PTTW: MEDICATED_PATCH | TRANSDERMAL | Status: AC

## 2015-07-02 MED ORDER — PRAVASTATIN SODIUM 40 MG PO TABS: 40.0000 mg | ORAL_TABLET | Freq: Every day | ORAL | Status: DC

## 2015-07-02 NOTE — Telephone Encounter (Signed)
LM for patient to [facility]

## 2015-07-02 NOTE — Telephone Encounter (Signed)
Pt made CPE appt for 10/09/15. She said she would like a refill on all meds except Prozac and Trazadone to last until this appt

## 2015-07-02 NOTE — Telephone Encounter (Signed)
Called patient and verified the rx she needed refilled. You are correct, and I have sent to pharmacy.

## 2015-07-02 NOTE — Telephone Encounter (Signed)
Pravastatin, dulera and Vivelle dot looks like the meds she might need refilled.  I believe that singulair and Valtrex were refilled 12/23 for 6 months. Confirm with patients about the Pravastatin, dulera and Vivelle dot, and then okay to refill to last until April's CPE.

## 2015-07-09 ENCOUNTER — Ambulatory Visit: Payer: Commercial Managed Care - PPO | Attending: Family Medicine

## 2015-07-09 DIAGNOSIS — G8929 Other chronic pain: Secondary | ICD-10-CM | POA: Diagnosis present

## 2015-07-09 DIAGNOSIS — M25521 Pain in right elbow: Secondary | ICD-10-CM | POA: Diagnosis not present

## 2015-07-09 DIAGNOSIS — M25562 Pain in left knee: Secondary | ICD-10-CM | POA: Diagnosis present

## 2015-07-09 NOTE — Therapy (Signed)
[facility] [address], [place], [place], [address] Phone: [phone]   Fax:  [phone]  Physical Therapy Treatment  Patient Details  Name: Debra Hardin MRN: [id number] Date of Birth: [birth year] Referring Provider: [name], MD  Encounter Date: 07/09/2015      PT End of Session - 07/09/15 1654    Visit Number 3   Date for PT Re-Evaluation 08/18/15   PT Start Time [id number]   PT Stop Time 1655   PT Time Calculation (min) 39 min   Activity Tolerance Patient tolerated treatment well   Behavior During Therapy [facility] for tasks assessed/performed      Past Medical History  Diagnosis Date  . Asthma   . HSV-2 (herpes simplex virus 2) infection   . Anxiety     Dr. [name]  . Depression   . Insomnia   . Vaginal dryness   . Bronchitis   . OSA (obstructive sleep apnea)     CPAP (uses sporadically)  . Hyperlipidemia   . Multiple thyroid nodules 03/2014    more prominent on L>R; h/o benign bx. f/u 1 yr  . Lateral epicondylitis right; 09/2014    Dr. [name]  . Trochanteric bursitis 01/2015    Past Surgical History  Procedure Laterality Date  . Cryotherapy    . Hernia repair  age 4    LIH  . Knee surgery Right     twice--Dr. Onnie Graham, Dr. Lynann Bologna (25 years ago)  . Hysterotomy    . Combined hysterectomy vaginal / oophorectomy / a&p repair  2008    pt states she has her ovaries  . Dilation and curettage of uterus      There were no vitals filed for this visit.  Visit Diagnosis:  Elbow pain, chronic, right  Knee pain, chronic, left      Subjective Assessment - 07/09/15 1617    Subjective carried a baby over the past week so Rt arm isn't any better.   Currently in Pain? Yes   Pain Score 8    Pain Location Elbow   Pain Orientation Right   Pain Descriptors / Indicators Aching;Sharp   Pain Type Chronic pain   Pain Onset More than a month ago   Pain Frequency Constant   Aggravating Factors  use of  computer, carrying objects, use of phone on the Rt   Pain Relieving Factors rest of Rt UE, support on a pillow                         [facility] Adult PT Treatment/Exercise - 07/09/15 0001    Exercises   Exercises Wrist;Knee/Hip   Knee/Hip Exercises: Stretches   Active Hamstring Stretch Left;3 reps;20 seconds   Knee/Hip Exercises: Seated   Long Arc Quad Strengthening;Left;2 sets;10 reps   Wrist Exercises   Wrist Flexion AROM;10 reps   Wrist Extension AROM;10 reps   Modalities   Modalities Iontophoresis;Ultrasound   Ultrasound   Ultrasound Location Rt distal biceps/triceps and proximal wrist extensor tendons   Ultrasound Parameters 3Mhz   Ultrasound Goals Pain   Iontophoresis   Type of Iontophoresis Dexamethasone   Location Rt elbow at lateral epicondyle  #2   Dose 1.0 cc   Time 6 hour wear patch   Manual Therapy   Manual Therapy Soft tissue mobilization   Manual therapy comments soft tissue elongation and trigger point release to Rt wrist extensor tendons and distal triceps on the Rt  PT Education - 07/09/15 1645    Education provided Yes   Education Details ionto info   Person(s) Educated Patient   Methods Explanation;Demonstration;Handout   Comprehension Verbalized understanding;Returned demonstration          PT Short Term Goals - 07/09/15 1619    PT SHORT TERM GOAL #1   Title be independent in initial HEP   Status Achieved   PT SHORT TERM GOAL #2   Title report a 30% reduction in Rt elbow pain with use   Time 4   Period Weeks   Status On-going  no change   PT SHORT TERM GOAL #3   Title use Rt UE > 75% with work and home tasks   Time 4   Period Weeks   Status On-going           PT Long Term Goals - 06/23/15 1027    PT LONG TERM GOAL #1   Title be independent in advanced HEP   Time 8   Period Weeks   Status New   PT LONG TERM GOAL #2   Title reduce FOTO to < or = to 40% limitation   Time 8   Period Weeks    Status New   PT LONG TERM GOAL #3   Title report a 60% reduction in Rt elbow pain with use   Time 8   Period Weeks   Status New   PT LONG TERM GOAL #4   Title report >90% use of Rt UE with home and work tasks   Time 8   Period Weeks   Status New   PT LONG TERM GOAL #5   Title report a 50% reduction in Lt knee pain with standing and walking   Time 8   Period Weeks   Status New               Plan - 07/09/15 1645    Clinical Impression Statement Pt carried a baby over the past week and this aggravated her Rt arm.  No change reported in Rt arm pain.  Pt with continued Rt UE limited use at 50% now.  Pt without Lt knee pain x 1 week.  Pt will continue to benefit from skilled PT for [place], manual and ionto to Rt elbow.     Pt will benefit from skilled therapeutic intervention in order to improve on the following deficits Decreased strength;Pain;Decreased activity tolerance;Difficulty walking   Rehab Potential Good   PT Frequency 2x / week   PT Duration 8 weeks   PT Treatment/Interventions ADLs/Self Care Home Management;Iontophoresis 4mg /ml Dexamethasone;Moist Heat;Therapeutic exercise;Therapeutic activities;Functional mobility training;Electrical Stimulation;Cryotherapy;Gait training;Ultrasound;Manual techniques;Neuromuscular re-education;Passive range of motion;Dry needling   PT Next Visit Plan Continue Ionto, Rt elbow manual and [place], Lt knee strength and flexibility   Consulted and Agree with Plan of Care Patient        Problem List Patient Active Problem List   Diagnosis Date Noted  . Left knee pain 06/02/2015  . Abnormality of gait 04/29/2015  . Lateral epicondylitis of right elbow 03/18/2015  . Pain in joint, ankle and foot 03/18/2015  . OSA on CPAP 01/30/2015  . Right hip pain 01/08/2015  . Right lateral epicondylitis 10/31/2014  . Hot flushes, perimenopausal  HT began 11/2013 10/28/2013  . Multinodular goiter 07/25/2013  . Anxiety 07/25/2013  . Asthma 07/25/2013  .  DJD (degenerative joint disease) 07/25/2013  . Depression 07/25/2013  . Hyperlipidemia 07/25/2013  . S/P hysterectomy 07/25/2013  . Genital herpes  07/25/2013  . Allergic rhinitis 07/25/2013  . Sleep apnea 07/25/2013  . Urinary incontinence 07/25/2013  . Vertigo 07/25/2013  . Hip bursitis 07/25/2013    TAKACS,KELLY, PT 07/09/2015, 4:56 PM  [facility] [address], [place], [place], [address] Phone: [phone]   Fax:  [phone]  Name: TRACYANN WASSMUTH MRN: [id number] Date of Birth: 04/28/1961

## 2015-07-09 NOTE — Patient Instructions (Signed)

## 2015-07-11 ENCOUNTER — Encounter: Payer: Commercial Managed Care - PPO | Admitting: Physical Therapy

## 2015-07-15 ENCOUNTER — Ambulatory Visit: Payer: Commercial Managed Care - PPO

## 2015-07-15 DIAGNOSIS — M25521 Pain in right elbow: Principal | ICD-10-CM

## 2015-07-15 DIAGNOSIS — G8929 Other chronic pain: Secondary | ICD-10-CM

## 2015-07-15 DIAGNOSIS — M25562 Pain in left knee: Secondary | ICD-10-CM

## 2015-07-15 NOTE — Patient Instructions (Signed)

## 2015-07-15 NOTE — Therapy (Signed)
[facility] [address] [place], [place], [address] Phone: [phone]   Fax:  [phone]  Physical Therapy Treatment  Patient Details  Name: Debra Hardin MRN: [id number] Date of Birth: May 04, 1961 Referring Provider: [name], MD  Encounter Date: 07/15/2015      PT End of Session - 07/15/15 1516    Visit Number 4   Date for PT Re-Evaluation 08/18/15   PT Start Time [id number]   PT Stop Time [id number]  treatment not needed for knee today    PT Time Calculation (min) 31 min   Activity Tolerance Patient tolerated treatment well   Behavior During Therapy [facility] for tasks assessed/performed      Past Medical History  Diagnosis Date  . Asthma   . HSV-2 (herpes simplex virus 2) infection   . Anxiety     Dr. [name]  . Depression   . Insomnia   . Vaginal dryness   . Bronchitis   . OSA (obstructive sleep apnea)     CPAP (uses sporadically)  . Hyperlipidemia   . Multiple thyroid nodules 03/2014    more prominent on L>R; h/o benign bx. f/u 1 yr  . Lateral epicondylitis right; 09/2014    Dr. [name]  . Trochanteric bursitis 01/2015    Past Surgical History  Procedure Laterality Date  . Cryotherapy    . Hernia repair  age 55    LIH  . Knee surgery Right     twice--Dr. Onnie Graham, Dr. Lynann Bologna (25 years ago)  . Hysterotomy    . Combined hysterectomy vaginal / oophorectomy / a&p repair  2008    pt states she has her ovaries  . Dilation and curettage of uterus      There were no vitals filed for this visit.  Visit Diagnosis:  Elbow pain, chronic, right  Knee pain, chronic, left      Subjective Assessment - 07/15/15 1447    Subjective Continued to carry a baby over the past week so Rt arm isn't any better.   Pertinent History Lt knee arthroscopic surgery   Currently in Pain? Yes   Pain Score 7    Pain Location Elbow   Pain Orientation Right   Pain Descriptors / Indicators Aching;Sharp   Pain Type  Chronic pain   Pain Onset More than a month ago   Pain Frequency Constant   Aggravating Factors  using computer, carrying objects, use of phone on the Rt   Pain Relieving Factors Rt of Rt UE, support on a pillow                         [facility] Adult PT Treatment/Exercise - 07/15/15 0001    Exercises   Exercises Wrist;Knee/Hip   Knee/Hip Exercises: Stretches   Active Hamstring Stretch Left;3 reps;20 seconds   Knee/Hip Exercises: Seated   Long Arc Quad --   Wrist Exercises   Wrist Flexion AROM;10 reps   Wrist Extension AROM;10 reps   Modalities   Modalities Iontophoresis;Ultrasound   Ultrasound   Ultrasound Location Rt distal biceps and triceps   Ultrasound Parameters 3Mhz 1.2 w/cm2 50% pulsed    Ultrasound Goals Pain   Iontophoresis   Type of Iontophoresis Dexamethasone   Location Rt elbow at lateral epicondyle  #3   Dose 1.0 cc   Time 6 hour wear patch   Manual Therapy   Manual Therapy Soft tissue mobilization   Manual therapy comments soft  tissue elongation and trigger point release to Rt wrist extensor tendons and distal triceps on the Rt                PT Education - 07/15/15 1448    Education provided Yes   Education Details ionto info   Person(s) Educated Patient   Methods Explanation;Demonstration;Handout   Comprehension Verbalized understanding;Returned demonstration          PT Short Term Goals - 07/09/15 1619    PT SHORT TERM GOAL #1   Title be independent in initial HEP   Status Achieved   PT SHORT TERM GOAL #2   Title report a 30% reduction in Rt elbow pain with use   Time 4   Period Weeks   Status On-going  no change   PT SHORT TERM GOAL #3   Title use Rt UE > 75% with work and home tasks   Time 4   Period Weeks   Status On-going           PT Long Term Goals - 06/23/15 1027    PT LONG TERM GOAL #1   Title be independent in advanced HEP   Time 8   Period Weeks   Status New   PT LONG TERM GOAL #2   Title reduce  FOTO to < or = to 40% limitation   Time 8   Period Weeks   Status New   PT LONG TERM GOAL #3   Title report a 60% reduction in Rt elbow pain with use   Time 8   Period Weeks   Status New   PT LONG TERM GOAL #4   Title report >90% use of Rt UE with home and work tasks   Time 8   Period Weeks   Status New   PT LONG TERM GOAL #5   Title report a 50% reduction in Lt knee pain with standing and walking   Time 8   Period Weeks   Status New               Plan - 07/15/15 1513    Clinical Impression Statement Pt has continued to carry a baby over the weekend so no change in pain.  She plans to rest the arm now.  Pt wtih 7/10 Rt elbow pain contiued and tenderness to palpation over distal arm biceps/triceps.  No Lt knee pain at all for 2 weeks.  Pt will continue to benefit from skilled PT for [place], manual, iand ionto to Rt elbow.     Pt will benefit from skilled therapeutic intervention in order to improve on the following deficits Decreased strength;Pain;Decreased activity tolerance;Difficulty walking   Rehab Potential Good   PT Frequency 2x / week   PT Duration 8 weeks   PT Treatment/Interventions ADLs/Self Care Home Management;Iontophoresis 4mg /ml Dexamethasone;Moist Heat;Therapeutic exercise;Therapeutic activities;Functional mobility training;Electrical Stimulation;Cryotherapy;Gait training;Ultrasound;Manual techniques;Neuromuscular re-education;Passive range of motion;Dry needling   PT Next Visit Plan Continue Ionto, Rt elbow manual and [place], Lt knee strength and flexibility   Consulted and Agree with Plan of Care Patient        Problem List Patient Active Problem List   Diagnosis Date Noted  . Left knee pain 06/02/2015  . Abnormality of gait 04/29/2015  . Lateral epicondylitis of right elbow 03/18/2015  . Pain in joint, ankle and foot 03/18/2015  . OSA on CPAP 01/30/2015  . Right hip pain 01/08/2015  . Right lateral epicondylitis 10/31/2014  . Hot flushes, perimenopausal   HT began  11/2013 10/28/2013  . Multinodular goiter 07/25/2013  . Anxiety 07/25/2013  . Asthma 07/25/2013  . DJD (degenerative joint disease) 07/25/2013  . Depression 07/25/2013  . Hyperlipidemia 07/25/2013  . S/P hysterectomy 07/25/2013  . Genital herpes 07/25/2013  . Allergic rhinitis 07/25/2013  . Sleep apnea 07/25/2013  . Urinary incontinence 07/25/2013  . Vertigo 07/25/2013  . Hip bursitis 07/25/2013    TAKACS,KELLY, PT 07/15/2015, 3:18 PM   [facility] [address] [place], [place], [address] Phone: [phone]   Fax:  [phone]  Name: MANASI WIEDMANN MRN: [id number] Date of Birth: [birth year]

## 2015-07-17 ENCOUNTER — Encounter: Payer: Self-pay | Admitting: Physical Therapy

## 2015-07-17 ENCOUNTER — Ambulatory Visit: Payer: Commercial Managed Care - PPO | Admitting: Physical Therapy

## 2015-07-17 DIAGNOSIS — M25521 Pain in right elbow: Principal | ICD-10-CM

## 2015-07-17 DIAGNOSIS — G8929 Other chronic pain: Secondary | ICD-10-CM

## 2015-07-17 NOTE — Therapy (Signed)
[facility] [address], [place], [place], [address] Phone: [phone]   Fax:  [phone]  Physical Therapy Treatment  Patient Details  Name: Debra Hardin MRN: [id number] Date of Birth: 1961-04-08 Referring Provider: [name], MD  Encounter Date: 07/17/2015      PT End of Session - 07/17/15 1631    Visit Number 5   Date for PT Re-Evaluation 08/18/15   PT Start Time 3903   PT Stop Time 1659   PT Time Calculation (min) 38 min   Activity Tolerance Patient tolerated treatment well   Behavior During Therapy [facility] for tasks assessed/performed      Past Medical History  Diagnosis Date  . Asthma   . HSV-2 (herpes simplex virus 2) infection   . Anxiety     Dr. [name]  . Depression   . Insomnia   . Vaginal dryness   . Bronchitis   . OSA (obstructive sleep apnea)     CPAP (uses sporadically)  . Hyperlipidemia   . Multiple thyroid nodules 03/2014    more prominent on L>R; h/o benign bx. f/u 1 yr  . Lateral epicondylitis right; 09/2014    Dr. [name]  . Trochanteric bursitis 01/2015    Past Surgical History  Procedure Laterality Date  . Cryotherapy    . Hernia repair  age 86    LIH  . Knee surgery Right     twice--Dr. Onnie Graham, Dr. Lynann Bologna (25 years ago)  . Hysterotomy    . Combined hysterectomy vaginal / oophorectomy / a&p repair  2008    pt states she has her ovaries  . Dilation and curettage of uterus      There were no vitals filed for this visit.  Visit Diagnosis:  Elbow pain, chronic, right      Subjective Assessment - 07/17/15 1627    Subjective I feel better because I did not work or carry the baby due to snow days. Less sharpness   Pertinent History Lt knee arthroscopic surgery   Limitations Standing;Walking   How long can you stand comfortably? edema in feet, fluid in back of Lt knee.     How long can you walk comfortably? edema in feet, fluid in back of Lt knee   Diagnostic  tests none   Patient Stated Goals reduce pain, stand and walk with less pain   Currently in Pain? Yes   Pain Score 4    Pain Location Elbow   Pain Orientation Right   Pain Descriptors / Indicators Aching   Pain Type Chronic pain   Pain Onset More than a month ago   Aggravating Factors  using the computer, using the iphone at work, carrying objects   Pain Relieving Factors support of a pillow,   Multiple Pain Sites No                         [facility] Adult PT Treatment/Exercise - 07/17/15 0001    Modalities   Modalities Iontophoresis;Ultrasound   Ultrasound   Ultrasound Location right distal biceps and triceps   Ultrasound Parameters 3 mhz, 1.2 w/cm2, 50% pulsed, and 8 min.    Ultrasound Goals Pain   Iontophoresis   Type of Iontophoresis Dexamethasone   Location Rt elbow at lateral epicondyle  #4   Dose 1 cc   Time 6 ohr patch   Manual Therapy   Manual Therapy Soft tissue mobilization;Joint mobilization   Manual therapy  comments right wrist flexors and extensors, right hand and triceps   Joint Mobilization right carpals, right radius at elbow                PT Education - 07/17/15 1700    Education provided No          PT Short Term Goals - 07/17/15 1632    PT SHORT TERM GOAL #1   Title be independent in initial HEP   Time 4   Period Weeks   PT SHORT TERM GOAL #2   Title report a 30% reduction in Rt elbow pain with use   Time 4   Period Weeks   Status Achieved  40%   PT SHORT TERM GOAL #3   Title use Rt UE > 75% with work and home tasks   Time 4   Period Weeks   Status Achieved           PT Long Term Goals - 06/23/15 1027    PT LONG TERM GOAL #1   Title be independent in advanced HEP   Time 8   Period Weeks   Status New   PT LONG TERM GOAL #2   Title reduce FOTO to < or = to 40% limitation   Time 8   Period Weeks   Status New   PT LONG TERM GOAL #3   Title report a 60% reduction in Rt elbow pain with use   Time 8    Period Weeks   Status New   PT LONG TERM GOAL #4   Title report >90% use of Rt UE with home and work tasks   Time 8   Period Weeks   Status New   PT LONG TERM GOAL #5   Title report a 50% reduction in Lt knee pain with standing and walking   Time 8   Period Weeks   Status New               Plan - 07/17/15 1633    Clinical Impression Statement patient reports her pain is 40% better due to not using her right arm at work because of snow.  Patient has met her STG's.  Patient has alot of tightness located in right forearm, right triceps, and right hand. Patient pain has decreased. Patient continues to have no left knee pain.  Patient does not feel any swelling in left knee. Patient will continue to benefit  from physical therapy to reduce pain.    Pt will benefit from skilled therapeutic intervention in order to improve on the following deficits Decreased strength;Pain;Decreased activity tolerance;Difficulty walking   Rehab Potential Good   Clinical Impairments Affecting Rehab Potential NOne   PT Frequency 2x / week   PT Duration 8 weeks   PT Treatment/Interventions ADLs/Self Care Home Management;Iontophoresis '4mg'$ /ml Dexamethasone;Moist Heat;Therapeutic exercise;Therapeutic activities;Functional mobility training;Electrical Stimulation;Cryotherapy;Gait training;Ultrasound;Manual techniques;Neuromuscular re-education;Passive range of motion;Dry needling   PT Next Visit Plan Continue Ionto, Rt elbow manual and [place], Lt knee strength and flexibility; has 2 ionto patches left   PT Home Exercise Plan progress as needed   Recommended Other Services None   Consulted and Agree with Plan of Care Patient        Problem List Patient Active Problem List   Diagnosis Date Noted  . Left knee pain 06/02/2015  . Abnormality of gait 04/29/2015  . Lateral epicondylitis of right elbow 03/18/2015  . Pain in joint, ankle and foot 03/18/2015  . OSA on CPAP 01/30/2015  .  Right hip pain 01/08/2015   . Right lateral epicondylitis 10/31/2014  . Hot flushes, perimenopausal  HT began 11/2013 10/28/2013  . Multinodular goiter 07/25/2013  . Anxiety 07/25/2013  . Asthma 07/25/2013  . DJD (degenerative joint disease) 07/25/2013  . Depression 07/25/2013  . Hyperlipidemia 07/25/2013  . S/P hysterectomy 07/25/2013  . Genital herpes 07/25/2013  . Allergic rhinitis 07/25/2013  . Sleep apnea 07/25/2013  . Urinary incontinence 07/25/2013  . Vertigo 07/25/2013  . Hip bursitis 07/25/2013    Earlie Counts, PT 07/17/2015 5:02 PM   [facility] [address] [place], [place], [address] Phone: [phone]   Fax:  [phone]  Name: SOPHI CALLIGAN MRN: [id number] Date of Birth: Aug 04, 1960

## 2015-07-21 ENCOUNTER — Ambulatory Visit: Payer: Commercial Managed Care - PPO

## 2015-07-21 DIAGNOSIS — M25521 Pain in right elbow: Secondary | ICD-10-CM | POA: Diagnosis not present

## 2015-07-21 DIAGNOSIS — M25562 Pain in left knee: Secondary | ICD-10-CM

## 2015-07-21 DIAGNOSIS — G8929 Other chronic pain: Secondary | ICD-10-CM

## 2015-07-21 NOTE — Patient Instructions (Addendum)
  Do 2 sets of 10 each on the Left 1-2 times a day.     Step-Down / Step-Up    Stand on stair step or ____ inch stool. Slowly bend left leg, lowering other foot to floor. Return by straightening front leg. Repeat ____ times per set. Do ____ sets per session. Do ____ sessions per day.  http://orth.exer.us/684     Lateral Step-Up    Stand with ___ inch step placed in L direction. Step onto step with right foot, facing forward, and without pushing off with the ground foot. Finish with ground foot in touch balance on step and return ___ times. ___ sets ___ times per day.  http://gglj.exer.us/167   Copyright  VHI. All rights reserved.   IONTOPHORESIS PATIENT PRECAUTIONS & CONTRAINDICATIONS:  . Redness under one or both electrodes can occur.  This characterized by a uniform redness that usually disappears within 12 hours of treatment. . Small pinhead size blisters may result in response to the drug.  Contact your physician if the problem persists more than 24 hours. . On rare occasions, iontophoresis therapy can result in temporary skin reactions such as rash, inflammation, irritation or burns.  The skin reactions may be the result of individual sensitivity to the ionic solution used, the condition of the skin at the start of treatment, reaction to the materials in the electrodes, allergies or sensitivity to dexamethasone, or a poor connection between the patch and your skin.  Discontinue using iontophoresis if you have any of these reactions and report to your therapist. . Remove the Patch or electrodes if you have any undue sensation of pain or burning during the treatment and report discomfort to your therapist. . Tell your Therapist if you have had known adverse reactions to the application of electrical current. . If using the Patch, the LED light will turn off when treatment is complete and the patch can be removed.  Approximate treatment time is 1-3 hours.  Remove the patch when  light goes off or after 6 hours. . The Patch can be worn during normal activity, however excessive motion where the electrodes have been placed can cause poor contact between the skin and the electrode or uneven electrical current resulting in greater risk of skin irritation. Marland Kitchen Keep out of the reach of children.   . DO NOT use if you have a cardiac pacemaker or any other electrically sensitive implanted device. . DO NOT use if you have a known sensitivity to dexamethasone. . DO NOT use during Magnetic Resonance Imaging (MRI). . DO NOT use over broken or compromised skin (e.g. sunburn, cuts, or acne) due to the increased risk of skin reaction. . DO NOT SHAVE over the area to be treated:  To establish good contact between the Patch and the skin, excessive hair may be clipped. . DO NOT place the Patch or electrodes on or over your eyes, directly over your heart, or brain. . DO NOT reuse the Patch or electrodes as this may cause burns to occur. [facility] [address] [place], [place] [address] Phone # [phone] Fax [phone]

## 2015-07-21 NOTE — Therapy (Signed)
[facility] [address], [place], [place], [address] Phone: [phone]   Fax:  [phone]  Physical Therapy Treatment  Patient Details  Name: Debra Hardin MRN: [id number] Date of Birth: 08-Oct-1960 Referring Provider: [name], MD  Encounter Date: 07/21/2015      PT End of Session - 07/21/15 1308    Visit Number 6   Date for PT Re-Evaluation 08/18/15   PT Start Time [id number]   PT Stop Time 1309   PT Time Calculation (min) 38 min   Activity Tolerance Patient tolerated treatment well   Behavior During Therapy [facility] for tasks assessed/performed      Past Medical History  Diagnosis Date  . Asthma   . HSV-2 (herpes simplex virus 2) infection   . Anxiety     Dr. [name]  . Depression   . Insomnia   . Vaginal dryness   . Bronchitis   . OSA (obstructive sleep apnea)     CPAP (uses sporadically)  . Hyperlipidemia   . Multiple thyroid nodules 03/2014    more prominent on L>R; h/o benign bx. f/u 1 yr  . Lateral epicondylitis right; 09/2014    Dr. [name]  . Trochanteric bursitis 01/2015    Past Surgical History  Procedure Laterality Date  . Cryotherapy    . Hernia repair  age 80    LIH  . Knee surgery Right     twice--Dr. Onnie Graham, Dr. Lynann Bologna (25 years ago)  . Hysterotomy    . Combined hysterectomy vaginal / oophorectomy / a&p repair  2008    pt states she has her ovaries  . Dilation and curettage of uterus      There were no vitals filed for this visit.  Visit Diagnosis:  Elbow pain, chronic, right  Knee pain, chronic, left      Subjective Assessment - 07/21/15 1235    Subjective No knee pain.  Rt elbow is hurting again.     Currently in Pain? Yes   Pain Score 7    Pain Location Elbow   Pain Orientation Right   Pain Descriptors / Indicators Aching   Pain Type Chronic pain   Pain Onset More than a month ago   Aggravating Factors  using the computer, phone use, carrying objects   Pain  Relieving Factors support of pillow, rest                         [facility] Adult PT Treatment/Exercise - 07/21/15 0001    Knee/Hip Exercises: Aerobic   Nustep Level 2 x 6 minutes  seat 8, arms 9   Knee/Hip Exercises: Standing   Lateral Step Up 2 sets;10 reps;Hand Hold: 1;Step Height: 6"   Forward Step Up 2 sets;Left;10 reps;Hand Hold: 2;Step Height: 6"   Knee/Hip Exercises: Seated   Long Arc Quad Strengthening;Left;2 sets;10 reps   Modalities   Modalities Iontophoresis;Ultrasound   Ultrasound   Ultrasound Location Rt distal biceps/triceps   Ultrasound Parameters 3 Mhz, 1.0 w/cm2 50% pulsed   Ultrasound Goals Pain   Iontophoresis   Type of Iontophoresis Dexamethasone   Location Rt elbow at lateral epicondyle  #5   Dose 1 cc   Time 6 ohr patch   Manual Therapy   Manual Therapy Soft tissue mobilization;Joint mobilization   Manual therapy comments soft tissue elongation and trigger point release to Rt wrist extensor tendons and distal triceps on the Rt  PT Education - 07/21/15 1303    Education provided Yes   Education Details ionto info, step up and lateral step up   Person(s) Educated Patient   Methods Explanation;Handout;Demonstration   Comprehension Verbalized understanding;Returned demonstration          PT Short Term Goals - 07/17/15 1632    PT SHORT TERM GOAL #1   Title be independent in initial HEP   Time 4   Period Weeks   PT SHORT TERM GOAL #2   Title report a 30% reduction in Rt elbow pain with use   Time 4   Period Weeks   Status Achieved  40%   PT SHORT TERM GOAL #3   Title use Rt UE > 75% with work and home tasks   Time 4   Period Weeks   Status Achieved           PT Long Term Goals - 07/21/15 1257    PT LONG TERM GOAL #1   Title be independent in advanced HEP   Time 8   Period Weeks   Status On-going   PT LONG TERM GOAL #3   Title report a 60% reduction in Rt elbow pain with use   Time 8   Period  Weeks   Status On-going  ~40% but this is nconsistent   PT LONG TERM GOAL #4   Title report >90% use of Rt UE with home and work tasks   Time 8   Period Weeks   Status On-going  75-80%   PT LONG TERM GOAL #5   Title report a 50% reduction in Lt knee pain with standing and walking   Status Achieved               Plan - 07/21/15 1255    Clinical Impression Statement Pt with 40% overall improvement since the starte of care.  Pain in Rt elbow remains 7/10 at times with use.  Pt is trying to rest her arm as much as possible. Pt with tension and active trigger points over distal biceps/triceps and wrist extensors.  Pt without Lt knee pain but reports continued functional weakness.  Pt tolerated advanced strength today for Lt knee.  Pt will continue to benefit from skilled PT for manual, 1 more ionto, [place] and strength progression as tolerated.   Pt will benefit from skilled therapeutic intervention in order to improve on the following deficits Decreased strength;Pain;Decreased activity tolerance;Difficulty walking   Rehab Potential Good   PT Frequency 2x / week   PT Duration 8 weeks   PT Treatment/Interventions ADLs/Self Care Home Management;Iontophoresis 4mg /ml Dexamethasone;Moist Heat;Therapeutic exercise;Therapeutic activities;Functional mobility training;Electrical Stimulation;Cryotherapy;Gait training;Ultrasound;Manual techniques;Neuromuscular re-education;Passive range of motion;Dry needling   PT Next Visit Plan Continue Ionto (1 more patch), Rt elbow manual and [place], Lt knee strength and flexibility        Problem List Patient Active Problem List   Diagnosis Date Noted  . Left knee pain 06/02/2015  . Abnormality of gait 04/29/2015  . Lateral epicondylitis of right elbow 03/18/2015  . Pain in joint, ankle and foot 03/18/2015  . OSA on CPAP 01/30/2015  . Right hip pain 01/08/2015  . Right lateral epicondylitis 10/31/2014  . Hot flushes, perimenopausal  HT began 11/2013  10/28/2013  . Multinodular goiter 07/25/2013  . Anxiety 07/25/2013  . Asthma 07/25/2013  . DJD (degenerative joint disease) 07/25/2013  . Depression 07/25/2013  . Hyperlipidemia 07/25/2013  . S/P hysterectomy 07/25/2013  . Genital herpes 07/25/2013  . Allergic  rhinitis 07/25/2013  . Sleep apnea 07/25/2013  . Urinary incontinence 07/25/2013  . Vertigo 07/25/2013  . Hip bursitis 07/25/2013    Nashea Chumney, PT 07/21/2015, 1:10 PM  [facility] [address], [place], [place], [address] Phone: [phone]   Fax:  [phone]  Name: Debra Hardin MRN: [id number] Date of Birth: December 04, 1960

## 2015-07-23 ENCOUNTER — Ambulatory Visit: Payer: Commercial Managed Care - PPO

## 2015-07-23 DIAGNOSIS — M25562 Pain in left knee: Secondary | ICD-10-CM

## 2015-07-23 DIAGNOSIS — G8929 Other chronic pain: Secondary | ICD-10-CM

## 2015-07-23 DIAGNOSIS — M25521 Pain in right elbow: Principal | ICD-10-CM

## 2015-07-23 NOTE — Therapy (Signed)
[facility] [address] [place], [place], [address] Phone: [phone]   Fax:  [phone]  Physical Therapy Treatment  Patient Details  Name: Debra Hardin MRN: [id number] Date of Birth: 27-May-1961 Referring Provider: [name], MD  Encounter Date: 07/23/2015      PT End of Session - 07/23/15 1659    Visit Number 7   PT Start Time [id number]   PT Stop Time 1458   PT Time Calculation (min) 33 min   Activity Tolerance Patient tolerated treatment well  Pt was late   Behavior During Therapy [facility] for tasks assessed/performed      Past Medical History  Diagnosis Date  . Asthma   . HSV-2 (herpes simplex virus 2) infection   . Anxiety     Dr. [name]  . Depression   . Insomnia   . Vaginal dryness   . Bronchitis   . OSA (obstructive sleep apnea)     CPAP (uses sporadically)  . Hyperlipidemia   . Multiple thyroid nodules 03/2014    more prominent on L>R; h/o benign bx. f/u 1 yr  . Lateral epicondylitis right; 09/2014    Dr. [name]  . Trochanteric bursitis 01/2015    Past Surgical History  Procedure Laterality Date  . Cryotherapy    . Hernia repair  age 38    LIH  . Knee surgery Right     twice--Dr. Onnie Graham, Dr. Lynann Bologna (25 years ago)  . Hysterotomy    . Combined hysterectomy vaginal / oophorectomy / a&p repair  2008    pt states she has her ovaries  . Dilation and curettage of uterus      There were no vitals filed for this visit.  Visit Diagnosis:  Elbow pain, chronic, right  Knee pain, chronic, left      Subjective Assessment - 07/23/15 1631    Subjective No knee pain.  Rt elbow pain is better today.  6/10 reported   Currently in Pain? Yes   Pain Score 6    Pain Location Elbow   Pain Orientation Right   Pain Descriptors / Indicators Aching   Pain Type Chronic pain   Pain Onset More than a month ago   Pain Frequency Constant   Aggravating Factors  using the computer, phone use,  carrying objects   Pain Relieving Factors support of pillow, rest                         [facility] Adult PT Treatment/Exercise - 07/23/15 0001    Knee/Hip Exercises: Aerobic   Nustep Level 2 x 8 minutes  seat 8, arms 9   Modalities   Modalities Iontophoresis;Ultrasound   Ultrasound   Ultrasound Location Rt distal biceps/triceps   Ultrasound Parameters 3 Mhz 1.0 w/cm2 50% pulsed x 6 minutes   Ultrasound Goals Pain   Iontophoresis   Type of Iontophoresis Dexamethasone   Location Rt elbow at lateral epicondyle  #6   Manual Therapy   Manual Therapy Soft tissue mobilization;Joint mobilization   Manual therapy comments soft tissue elongation and trigger point release to Rt wrist extensor tendons and distal triceps on the Rt                  PT Short Term Goals - 07/17/15 1632    PT SHORT TERM GOAL #1   Title be independent in initial HEP   Time 4   Period Weeks  PT SHORT TERM GOAL #2   Title report a 30% reduction in Rt elbow pain with use   Time 4   Period Weeks   Status Achieved  40%   PT SHORT TERM GOAL #3   Title use Rt UE > 75% with work and home tasks   Time 4   Period Weeks   Status Achieved           PT Long Term Goals - 07/21/15 1257    PT LONG TERM GOAL #1   Title be independent in advanced HEP   Time 8   Period Weeks   Status On-going   PT LONG TERM GOAL #3   Title report a 60% reduction in Rt elbow pain with use   Time 8   Period Weeks   Status On-going  ~40% but this is nconsistent   PT LONG TERM GOAL #4   Title report >90% use of Rt UE with home and work tasks   Time 8   Period Weeks   Status On-going  75-80%   PT LONG TERM GOAL #5   Title report a 50% reduction in Lt knee pain with standing and walking   Status Achieved               Plan - 07/23/15 1633    Clinical Impression Statement Pt with 40% overall improvment since the start of care.  Pt in Rt elbow is 6/10 today and is increased with use.  Pt is  trying to rest the arm as much as possible.  Pt with tension and active trigger points over distal biceps/triceps and wrist extensors.  Pt is tolerating new HEP fore knee strength.  Pt will benefit from skilled PT for manual, [place] and strength as tolerated.     Pt will benefit from skilled therapeutic intervention in order to improve on the following deficits Decreased strength;Pain;Decreased activity tolerance;Difficulty walking   Rehab Potential Good   PT Frequency 2x / week   PT Duration 8 weeks   PT Treatment/Interventions ADLs/Self Care Home Management;Iontophoresis 4mg /ml Dexamethasone;Moist Heat;Therapeutic exercise;Therapeutic activities;Functional mobility training;Electrical Stimulation;Cryotherapy;Gait training;Ultrasound;Manual techniques;Neuromuscular re-education;Passive range of motion;Dry needling   PT Next Visit Plan  Rt elbow manual and [place], Lt knee strength and flexibility.  Dry needling next week.   Consulted and Agree with Plan of Care Patient        Problem List Patient Active Problem List   Diagnosis Date Noted  . Left knee pain 06/02/2015  . Abnormality of gait 04/29/2015  . Lateral epicondylitis of right elbow 03/18/2015  . Pain in joint, ankle and foot 03/18/2015  . OSA on CPAP 01/30/2015  . Right hip pain 01/08/2015  . Right lateral epicondylitis 10/31/2014  . Hot flushes, perimenopausal  HT began 11/2013 10/28/2013  . Multinodular goiter 07/25/2013  . Anxiety 07/25/2013  . Asthma 07/25/2013  . DJD (degenerative joint disease) 07/25/2013  . Depression 07/25/2013  . Hyperlipidemia 07/25/2013  . S/P hysterectomy 07/25/2013  . Genital herpes 07/25/2013  . Allergic rhinitis 07/25/2013  . Sleep apnea 07/25/2013  . Urinary incontinence 07/25/2013  . Vertigo 07/25/2013  . Hip bursitis 07/25/2013    Roarke Marciano, PT 07/23/2015, 5:00 PM   [facility] [address], [place], [place], [address] Phone:  [phone]   Fax:  [phone]  Name: CAILLEY STANKO MRN: [id number] Date of Birth: 13-Jun-1961

## 2015-07-25 ENCOUNTER — Encounter: Payer: Commercial Managed Care - PPO | Admitting: Physical Therapy

## 2015-07-28 ENCOUNTER — Encounter: Payer: Commercial Managed Care - PPO | Admitting: Physical Therapy

## 2015-07-29 ENCOUNTER — Ambulatory Visit: Payer: Commercial Managed Care - PPO | Admitting: Physical Therapy

## 2015-07-29 DIAGNOSIS — M25521 Pain in right elbow: Secondary | ICD-10-CM | POA: Diagnosis not present

## 2015-07-29 DIAGNOSIS — G8929 Other chronic pain: Secondary | ICD-10-CM

## 2015-07-29 NOTE — Patient Instructions (Signed)

## 2015-07-29 NOTE — Therapy (Signed)
[facility] [address] [place], [place], [address] Phone: [phone]   Fax:  [phone]  Physical Therapy Treatment  Patient Details  Name: Debra Hardin MRN: [id number] Date of Birth: Jun 22, 1961 Referring Provider: [name], MD  Encounter Date: 07/29/2015      PT End of Session - 07/29/15 1421    Visit Number 8   Date for PT Re-Evaluation 08/18/15   Authorization Type U[employer]   PT Start Time 0735   PT Stop Time 0815   PT Time Calculation (min) 40 min   Activity Tolerance Patient tolerated treatment well      Past Medical History  Diagnosis Date  . Asthma   . HSV-2 (herpes simplex virus 2) infection   . Anxiety     Dr. [name]  . Depression   . Insomnia   . Vaginal dryness   . Bronchitis   . OSA (obstructive sleep apnea)     CPAP (uses sporadically)  . Hyperlipidemia   . Multiple thyroid nodules 03/2014    more prominent on L>R; h/o benign bx. f/u 1 yr  . Lateral epicondylitis right; 09/2014    Dr. [name]  . Trochanteric bursitis 01/2015    Past Surgical History  Procedure Laterality Date  . Cryotherapy    . Hernia repair  age 55    LIH  . Knee surgery Right     twice--Dr. Onnie Graham, Dr. Lynann Bologna (25 years ago)  . Hysterotomy    . Combined hysterectomy vaginal / oophorectomy / a&p repair  2008    pt states she has her ovaries  . Dilation and curettage of uterus      There were no vitals filed for this visit.  Visit Diagnosis:  Elbow pain, chronic, right      Subjective Assessment - 07/29/15 1404    Subjective Patient states the pain has moved a little and is now in right elbow and forearm.  She has bruising in triceps region from self massage.     Currently in Pain? Yes   Pain Score 6    Pain Location Elbow   Pain Orientation Right   Pain Type Chronic pain   Pain Onset More than a month ago   Pain Frequency Constant                         [facility] Adult PT  Treatment/Exercise - 07/29/15 0001    Elbow Exercises   Wrist Flexion AROM;10 reps   Wrist Extension AROM;10 reps   Moist Heat Therapy   Number Minutes Moist Heat 15 Minutes   Moist Heat Location Elbow   Manual Therapy   Manual Therapy Soft tissue mobilization;Myofascial release   Soft tissue mobilization Instrument assisted Graston G1 to tricepts   Myofascial Release triceps, ECRB, Supinator          Trigger Point Dry Needling - 07/29/15 1419    Consent Given? Yes   Education Handout Provided Yes   Muscles Treated Upper Body --  right triceps, ECRB, supinator;muscle twitch,inc musc length      Right only          PT Education - 07/29/15 1420    Education provided Yes   Education Details dry needling after care   Person(s) Educated Patient   Methods Explanation;Handout   Comprehension Verbalized understanding          PT Short Term Goals - 07/29/15 1425  PT SHORT TERM GOAL #1   Title be independent in initial HEP   Status Achieved   PT SHORT TERM GOAL #2   Title report a 30% reduction in Rt elbow pain with use   Status Achieved   PT SHORT TERM GOAL #3   Title use Rt UE > 75% with work and home tasks   Status Achieved           PT Long Term Goals - 07/29/15 1425    PT LONG TERM GOAL #1   Title be independent in advanced HEP   Time 8   Period Weeks   Status On-going   PT LONG TERM GOAL #2   Title reduce FOTO to < or = to 40% limitation   Time 8   Period Weeks   Status On-going   PT LONG TERM GOAL #3   Title report a 60% reduction in Rt elbow pain with use   Time 8   Period Weeks   Status On-going   PT LONG TERM GOAL #4   Title report >90% use of Rt UE with home and work tasks   Time 8   Period Weeks   Status On-going   PT LONG TERM GOAL #5   Title report a 50% reduction in Lt knee pain with standing and walking   Status Achieved               Plan - 07/29/15 1421    Clinical Impression Statement The patient reports her pain  has changed location from triceps region to now more in lateral elbow and forearm.  Tender points indentified in distal lateral triceps, extensor carpi radialis brevis.  Improved muscle length following dry needling and manual therapy.  Therapist closely monitoring response.  Will initiate strengthening as tolerated pain-wise.     PT Next Visit Plan assess response to first dry needling and continue as needed;  initiate strengthening; moist heat;  ? kinesiotaping        Problem List Patient Active Problem List   Diagnosis Date Noted  . Left knee pain 06/02/2015  . Abnormality of gait 04/29/2015  . Lateral epicondylitis of right elbow 03/18/2015  . Pain in joint, ankle and foot 03/18/2015  . OSA on CPAP 01/30/2015  . Right hip pain 01/08/2015  . Right lateral epicondylitis 10/31/2014  . Hot flushes, perimenopausal  HT began 11/2013 10/28/2013  . Multinodular goiter 07/25/2013  . Anxiety 07/25/2013  . Asthma 07/25/2013  . DJD (degenerative joint disease) 07/25/2013  . Depression 07/25/2013  . Hyperlipidemia 07/25/2013  . S/P hysterectomy 07/25/2013  . Genital herpes 07/25/2013  . Allergic rhinitis 07/25/2013  . Sleep apnea 07/25/2013  . Urinary incontinence 07/25/2013  . Vertigo 07/25/2013  . Hip bursitis 07/25/2013    [name] 07/29/2015, 2:26 PM  [facility] [address], [place], [place], [address] Phone: [phone]   Fax:  [phone]  Name: Debra Hardin MRN: [id number] Date of Birth: 1960/07/20   Ruben Im, PT 07/29/2015 2:27 PM Phone: [phone] Fax: [phone]

## 2015-07-31 ENCOUNTER — Ambulatory Visit: Payer: Commercial Managed Care - PPO | Admitting: Physical Therapy

## 2015-07-31 DIAGNOSIS — G8929 Other chronic pain: Secondary | ICD-10-CM

## 2015-07-31 DIAGNOSIS — M25521 Pain in right elbow: Principal | ICD-10-CM

## 2015-07-31 NOTE — Therapy (Signed)
[facility] [address] [place], [place], [address] Phone: [phone]   Fax:  [phone]  Physical Therapy Treatment  Patient Details  Name: Debra Hardin MRN: [id number] Date of Birth: 03/11/1961 Referring Provider: [name], MD  Encounter Date: 07/31/2015      PT End of Session - 07/31/15 1129    Visit Number 9   Date for PT Re-Evaluation 08/18/15   Authorization Type UHC   PT Start Time 0730   PT Stop Time 0813   PT Time Calculation (min) 43 min   Activity Tolerance Patient tolerated treatment well      Past Medical History  Diagnosis Date  . Asthma   . HSV-2 (herpes simplex virus 2) infection   . Anxiety     Dr. [name]  . Depression   . Insomnia   . Vaginal dryness   . Bronchitis   . OSA (obstructive sleep apnea)     CPAP (uses sporadically)  . Hyperlipidemia   . Multiple thyroid nodules 03/2014    more prominent on L>R; h/o benign bx. f/u 1 yr  . Lateral epicondylitis right; 09/2014    Dr. [name]  . Trochanteric bursitis 01/2015    Past Surgical History  Procedure Laterality Date  . Cryotherapy    . Hernia repair  age 55    LIH  . Knee surgery Right     twice--Dr. Onnie Graham, Dr. Lynann Bologna (25 years ago)  . Hysterotomy    . Combined hysterectomy vaginal / oophorectomy / a&p repair  2008    pt states she has her ovaries  . Dilation and curettage of uterus      There were no vitals filed for this visit.  Visit Diagnosis:  Elbow pain, chronic, right      Subjective Assessment - 07/31/15 0734    Subjective Not terribly sore following DN.  Had massage yesterday.   Noticed hard to unscrew car oil cap or take off a bottle cap.     Currently in Pain? Yes   Pain Score 5    Pain Location Elbow   Pain Orientation Right   Pain Type Chronic pain   Pain Frequency Constant   Aggravating Factors  unscrew lid                         [facility] Adult PT Treatment/Exercise  - 07/31/15 0738    Elbow Exercises   Bar Weights/Barbell (Wrist Extension) 2 lbs   Wrist Extension Limitations with therapist mob 15x   Shoulder Exercises: Standing   Extension Strengthening;Right;15 reps;Theraband   Theraband Level (Shoulder Extension) Level 1 (Yellow)   Other Standing Exercises triceps ext with yellow band over top of door 15x   Moist Heat Therapy   Number Minutes Moist Heat 15 Minutes   Moist Heat Location Elbow   Manual Therapy   Joint Mobilization mob with movement at elbow with gripping ex's 15x  C C7-C4 PA, PA and lateral glides grade 3 10x each level   Myofascial Release triceps, ECRB          Trigger Point Dry Needling - 07/31/15 1127    Consent Given? Yes   Muscles Treated Upper Body --  right triceps and extensor carpi radialis              PT Education - 07/31/15 1128    Education provided Yes   Education Details yellow band shoulder extension, triceps ext,  wrist ext with 2#   Person(s) Educated Patient   Methods Explanation;Demonstration;Handout   Comprehension Verbalized understanding;Returned demonstration          PT Short Term Goals - 07/31/15 1132    PT SHORT TERM GOAL #1   Title be independent in initial HEP   Status Achieved   PT SHORT TERM GOAL #2   Title report a 30% reduction in Rt elbow pain with use   Status Achieved   PT SHORT TERM GOAL #3   Title use Rt UE > 75% with work and home tasks   Status Achieved           PT Long Term Goals - 07/31/15 1132    PT LONG TERM GOAL #1   Title be independent in advanced HEP   Time 8   Period Weeks   Status On-going   PT LONG TERM GOAL #2   Title reduce FOTO to < or = to 40% limitation   Time 8   Period Weeks   Status On-going   PT LONG TERM GOAL #3   Title report a 60% reduction in Rt elbow pain with use   Time 8   Period Weeks   Status On-going   PT LONG TERM GOAL #4   Title report >90% use of Rt UE with home and work tasks   Time 8   Period Weeks   Status  On-going   PT LONG TERM GOAL #5   Title report a 50% reduction in Lt knee pain with standing and walking   Status Achieved               Plan - 07/31/15 1129    Clinical Impression Statement The patient reports some improvement since last session especially with decreased trigger point in triceps region.  Initiated strengthening concentric and eccentric with some discomfort with wrist ext and gripping.  Pain decreased with elbow mob with movement with these ex's.  Therapist closely monitoring response throughout and modifying as needed.   PT Next Visit Plan assess response to 2nd dry needling and continue as needed;  continue right UE;  mobs with movement;   strengthening; moist heat;  ? kinesiotaping        Problem List Patient Active Problem List   Diagnosis Date Noted  . Left knee pain 06/02/2015  . Abnormality of gait 04/29/2015  . Lateral epicondylitis of right elbow 03/18/2015  . Pain in joint, ankle and foot 03/18/2015  . OSA on CPAP 01/30/2015  . Right hip pain 01/08/2015  . Right lateral epicondylitis 10/31/2014  . Hot flushes, perimenopausal  HT began 11/2013 10/28/2013  . Multinodular goiter 07/25/2013  . Anxiety 07/25/2013  . Asthma 07/25/2013  . DJD (degenerative joint disease) 07/25/2013  . Depression 07/25/2013  . Hyperlipidemia 07/25/2013  . S/P hysterectomy 07/25/2013  . Genital herpes 07/25/2013  . Allergic rhinitis 07/25/2013  . Sleep apnea 07/25/2013  . Urinary incontinence 07/25/2013  . Vertigo 07/25/2013  . Hip bursitis 07/25/2013    Ruben Im C 07/31/2015, 11:33 AM  [facility] [address] [place], [place], [address] Phone: [phone]   Fax:  [phone]  Name: ELAYSHA LUCIANO MRN: [id number] Date of Birth: April 28, 1961    Ruben Im, PT 07/31/2015 11:34 AM Phone: [phone] Fax: [phone]

## 2015-08-04 ENCOUNTER — Ambulatory Visit: Payer: Commercial Managed Care - PPO

## 2015-08-04 DIAGNOSIS — M25521 Pain in right elbow: Secondary | ICD-10-CM | POA: Diagnosis not present

## 2015-08-04 DIAGNOSIS — G8929 Other chronic pain: Secondary | ICD-10-CM

## 2015-08-04 NOTE — Therapy (Signed)
[facility] [address], [place], [place], [address] Phone: [phone]   Fax:  [phone]  Physical Therapy Treatment  Patient Details  Name: Debra Hardin MRN: [id number] Date of Birth: 1960-07-11 Referring Provider: [name], MD  Encounter Date: 08/04/2015      PT End of Session - 08/04/15 1655    Visit Number 10   Date for PT Re-Evaluation 08/18/15   PT Start Time [id number]   PT Stop Time 1655   PT Time Calculation (min) 41 min   Activity Tolerance Patient tolerated treatment well   Behavior During Therapy [facility] for tasks assessed/performed      Past Medical History  Diagnosis Date  . Asthma   . HSV-2 (herpes simplex virus 2) infection   . Anxiety     Dr. [name]  . Depression   . Insomnia   . Vaginal dryness   . Bronchitis   . OSA (obstructive sleep apnea)     CPAP (uses sporadically)  . Hyperlipidemia   . Multiple thyroid nodules 03/2014    more prominent on L>R; h/o benign bx. f/u 1 yr  . Lateral epicondylitis right; 09/2014    Dr. [name]  . Trochanteric bursitis 01/2015    Past Surgical History  Procedure Laterality Date  . Cryotherapy    . Hernia repair  age 46    LIH  . Knee surgery Right     twice--Dr. Onnie Graham, Dr. Lynann Bologna (25 years ago)  . Hysterotomy    . Combined hysterectomy vaginal / oophorectomy / a&p repair  2008    pt states she has her ovaries  . Dilation and curettage of uterus      There were no vitals filed for this visit.  Visit Diagnosis:  Elbow pain, chronic, right      Subjective Assessment - 08/04/15 1624    Subjective Trigger points have significantly reduced after dry needling.  Still have pain deep in the Lt elbow.     Currently in Pain? Yes   Pain Score 5    Pain Location Elbow   Pain Orientation Right   Pain Descriptors / Indicators Aching   Pain Type Chronic pain   Pain Onset More than a month ago   Pain Frequency Constant   Aggravating  Factors  unscrewing lid, use of Lt elbow   Pain Relieving Factors rest, heat                         [facility] Adult PT Treatment/Exercise - 08/04/15 0001    Exercises   Exercises Shoulder   Elbow Exercises   Bar Weights/Barbell (Wrist Extension) 2 lbs  x20   Shoulder Exercises: Standing   Extension Strengthening;Right;15 reps;Theraband   Theraband Level (Shoulder Extension) Level 1 (Yellow)   Other Standing Exercises triceps ext with yellow band over top of door 15x   Shoulder Exercises: ROM/Strengthening   UBE (Upper Arm Bike) Level 1x 6 minutes (3/3)   Manual Therapy   Manual Therapy Soft tissue mobilization;Myofascial release   Joint Mobilization radius/ulnar mobs   Myofascial Release triceps, ECRB                  PT Short Term Goals - 07/31/15 1132    PT SHORT TERM GOAL #1   Title be independent in initial HEP   Status Achieved   PT SHORT TERM GOAL #2   Title report a 30% reduction in Rt elbow  pain with use   Status Achieved   PT SHORT TERM GOAL #3   Title use Rt UE > 75% with work and home tasks   Status Achieved           PT Long Term Goals - 08/04/15 1629    PT LONG TERM GOAL #1   Title be independent in advanced HEP   Time 8   Period Weeks   Status On-going   PT LONG TERM GOAL #2   Title reduce FOTO to < or = to 40% limitation   Time 8   Period Weeks   Status On-going   PT LONG TERM GOAL #4   Title report >90% use of Rt UE with home and work tasks   Time 8   Period Weeks   Status On-going  75% use   PT LONG TERM GOAL #5   Title report a 50% reduction in Lt knee pain with standing and walking   Status Achieved               Plan - 08/04/15 1631    Clinical Impression Statement Pt reports 25% reduction in overall Lt arm pain with use and 75% use of Lt UEs with ADLs and self-care.  Pt with continued trigger points in Lt wrist extensor tendons although this is decresased.  Pt is tolerating Lt wrist and elbow strength  exercises without limitation or increased pain.  Pt will beneift from contiued manual, strength and dry needling to reduce pain and increase use of Lt UE   Pt will benefit from skilled therapeutic intervention in order to improve on the following deficits Decreased strength;Pain;Decreased activity tolerance;Difficulty walking   Rehab Potential Good   PT Frequency 2x / week   PT Duration 8 weeks   PT Treatment/Interventions ADLs/Self Care Home Management;Iontophoresis 4mg /ml Dexamethasone;Moist Heat;Therapeutic exercise;Therapeutic activities;Functional mobility training;Electrical Stimulation;Cryotherapy;Gait training;Ultrasound;Manual techniques;Neuromuscular re-education;Passive range of motion;Dry needling   PT Next Visit Plan  Continue right UE strength, flexibility  mobs with movement;   strengthening; moist heat;  dry needling next session   Consulted and Agree with Plan of Care Patient        Problem List Patient Active Problem List   Diagnosis Date Noted  . Left knee pain 06/02/2015  . Abnormality of gait 04/29/2015  . Lateral epicondylitis of right elbow 03/18/2015  . Pain in joint, ankle and foot 03/18/2015  . OSA on CPAP 01/30/2015  . Right hip pain 01/08/2015  . Right lateral epicondylitis 10/31/2014  . Hot flushes, perimenopausal  HT began 11/2013 10/28/2013  . Multinodular goiter 07/25/2013  . Anxiety 07/25/2013  . Asthma 07/25/2013  . DJD (degenerative joint disease) 07/25/2013  . Depression 07/25/2013  . Hyperlipidemia 07/25/2013  . S/P hysterectomy 07/25/2013  . Genital herpes 07/25/2013  . Allergic rhinitis 07/25/2013  . Sleep apnea 07/25/2013  . Urinary incontinence 07/25/2013  . Vertigo 07/25/2013  . Hip bursitis 07/25/2013    TAKACS,KELLY, PT 08/04/2015, 4:56 PM  [facility] [address] [place], [place], [address] Phone: [phone]   Fax:  [phone]  Name: Debra Hardin MRN:  [id number] Date of Birth: 03-Mar-1961

## 2015-08-07 ENCOUNTER — Ambulatory Visit: Payer: Commercial Managed Care - PPO | Attending: Family Medicine | Admitting: Physical Therapy

## 2015-08-07 DIAGNOSIS — M25562 Pain in left knee: Secondary | ICD-10-CM | POA: Insufficient documentation

## 2015-08-07 DIAGNOSIS — G8929 Other chronic pain: Secondary | ICD-10-CM | POA: Diagnosis present

## 2015-08-07 DIAGNOSIS — M25521 Pain in right elbow: Secondary | ICD-10-CM | POA: Diagnosis not present

## 2015-08-07 NOTE — Therapy (Signed)
[facility] [address], [place], [place], [address] Phone: [phone]   Fax:  [phone]  Physical Therapy Treatment  Patient Details  Name: Debra Hardin MRN: [id number] Date of Birth: [birth year] Referring Provider: [name], MD  Encounter Date: 08/07/2015      PT End of Session - 08/07/15 0809    Visit Number 11   Date for PT Re-Evaluation 08/18/15   Authorization Type UHC   PT Start Time 0730   PT Stop Time 0818   PT Time Calculation (min) 48 min   Activity Tolerance Patient tolerated treatment well      Past Medical History  Diagnosis Date  . Asthma   . HSV-2 (herpes simplex virus 2) infection   . Anxiety     Dr. [name]  . Depression   . Insomnia   . Vaginal dryness   . Bronchitis   . OSA (obstructive sleep apnea)     CPAP (uses sporadically)  . Hyperlipidemia   . Multiple thyroid nodules 03/2014    more prominent on L>R; h/o benign bx. f/u 1 yr  . Lateral epicondylitis right; 09/2014    Dr. [name]  . Trochanteric bursitis 01/2015    Past Surgical History  Procedure Laterality Date  . Cryotherapy    . Hernia repair  age 55    LIH  . Knee surgery Right     twice--Dr. Onnie Graham, Dr. Lynann Bologna (25 years ago)  . Hysterotomy    . Combined hysterectomy vaginal / oophorectomy / a&p repair  2008    pt states she has her ovaries  . Dilation and curettage of uterus      There were no vitals filed for this visit.  Visit Diagnosis:  Elbow pain, chronic, right  Knee pain, chronic, left      Subjective Assessment - 08/07/15 0734    Subjective I played the guitar the other night and that flared it up.  More in the forearm region, some in triceps.  Better but still hurting. Avoided doing strengthening yesterday.   I feel like the dry needling has helped loosen up some stuff.     Currently in Pain? Yes   Pain Score 6    Pain Location Elbow   Pain Orientation Right   Pain Type Chronic  pain   Aggravating Factors  night time; playing the guitar; lifting the milk; using smartphone                         [facility] Adult PT Treatment/Exercise - 08/07/15 0001    Moist Heat Therapy   Number Minutes Moist Heat 10 Minutes   Moist Heat Location Elbow   Manual Therapy   Manual Therapy Joint mobilization;Taping   Manual therapy comments McConnell tape across ECRB   Joint Mobilization radius/ulnar mobs  MWM 10x; radial head glides with neural tension 8x grade 3   Soft tissue mobilization Instrument assisted Graston G4 to triceps   Myofascial Release triceps, ECRB                PT Education - 08/07/15 0809    Education provided Yes   Education Details bracing   Person(s) Educated Patient   Methods Explanation;Demonstration   Comprehension Verbalized understanding          PT Short Term Goals - 08/07/15 0826    PT SHORT TERM GOAL #1   Title be independent in initial HEP  Status Achieved   PT SHORT TERM GOAL #2   Title report a 30% reduction in Rt elbow pain with use   Status Achieved   PT SHORT TERM GOAL #3   Title use Rt UE > 75% with work and home tasks   Status Achieved           PT Long Term Goals - 08/07/15 [id number]    PT LONG TERM GOAL #1   Title be independent in advanced HEP   Time 8   Period Weeks   Status On-going   PT LONG TERM GOAL #2   Title reduce FOTO to < or = to 40% limitation   Time 8   Period Weeks   Status On-going   PT LONG TERM GOAL #3   Title report a 60% reduction in Rt elbow pain with use   Time 8   Period Weeks   Status On-going   PT LONG TERM GOAL #4   Title report >90% use of Rt UE with home and work tasks   Time 8   Period Weeks   Status On-going   PT LONG TERM GOAL #5   Title report a 50% reduction in Lt knee pain with standing and walking   Status Achieved               Plan - 08/07/15 0810    Clinical Impression Statement The patient reports continued frustration over this issue.   Pain location continues to vary with less pain in triceps region and new pain distal radial wrist today (possibly from playing the guitar).  Continues to have tender points in extensor carpi radialis brevis (ECRB).  Trial of strapping tape over extensor wad to see if bracing may be helpful.  Therapist closely monitoring response throughout out.  Sees Dr. Oneida Alar next week.     PT Next Visit Plan Assess response to dry needle #3; manual therapy; strengthening as tolerated;  assess response to taping;  route prog note to Dr. Oneida Alar next visit.  Check progress toward goals.          Problem List Patient Active Problem List   Diagnosis Date Noted  . Left knee pain 06/02/2015  . Abnormality of gait 04/29/2015  . Lateral epicondylitis of right elbow 03/18/2015  . Pain in joint, ankle and foot 03/18/2015  . OSA on CPAP 01/30/2015  . Right hip pain 01/08/2015  . Right lateral epicondylitis 10/31/2014  . Hot flushes, perimenopausal  HT began 11/2013 10/28/2013  . Multinodular goiter 07/25/2013  . Anxiety 07/25/2013  . Asthma 07/25/2013  . DJD (degenerative joint disease) 07/25/2013  . Depression 07/25/2013  . Hyperlipidemia 07/25/2013  . S/P hysterectomy 07/25/2013  . Genital herpes 07/25/2013  . Allergic rhinitis 07/25/2013  . Sleep apnea 07/25/2013  . Urinary incontinence 07/25/2013  . Vertigo 07/25/2013  . Hip bursitis 07/25/2013    Ruben Im C 08/07/2015, 8:27 AM  [facility] [address] [place], [place], [address] Phone: [phone]   Fax:  [phone]  Name: Debra Hardin MRN: [id number] Date of Birth: 1960-09-21    Ruben Im, PT 08/07/2015 8:28 AM Phone: [phone] Fax: [phone]

## 2015-08-07 NOTE — Patient Instructions (Signed)
Wear tape today and remove before bed.  If helpful consider purchasing "tennis elbow brace".  Also consider wrist brace for night time only.

## 2015-08-12 ENCOUNTER — Ambulatory Visit: Payer: Commercial Managed Care - PPO | Admitting: Physical Therapy

## 2015-08-12 ENCOUNTER — Other Ambulatory Visit (INDEPENDENT_AMBULATORY_CARE_PROVIDER_SITE_OTHER): Payer: Commercial Managed Care - PPO

## 2015-08-12 DIAGNOSIS — Z23 Encounter for immunization: Secondary | ICD-10-CM | POA: Diagnosis not present

## 2015-08-12 DIAGNOSIS — M25521 Pain in right elbow: Principal | ICD-10-CM

## 2015-08-12 DIAGNOSIS — G8929 Other chronic pain: Secondary | ICD-10-CM

## 2015-08-12 NOTE — Therapy (Signed)
[facility] [address] [place], [place], [address] Phone: [phone]   Fax:  [phone]  Physical Therapy Treatment  Patient Details  Name: Debra Hardin MRN: [id number] Date of Birth: [birth year] Referring Provider: [name], MD  Encounter Date: 08/12/2015      PT End of Session - 08/12/15 1349    Visit Number 12   Date for PT Re-Evaluation 08/18/15   Authorization Type [employer]   PT Start Time [id number]   PT Stop Time 0810   PT Time Calculation (min) 35 min   Activity Tolerance Patient tolerated treatment well      Past Medical History  Diagnosis Date  . Asthma   . HSV-2 (herpes simplex virus 2) infection   . Anxiety     Dr. [name]  . Depression   . Insomnia   . Vaginal dryness   . Bronchitis   . OSA (obstructive sleep apnea)     CPAP (uses sporadically)  . Hyperlipidemia   . Multiple thyroid nodules 03/2014    more prominent on L>R; h/o benign bx. f/u 1 yr  . Lateral epicondylitis right; 09/2014    Dr. [name]  . Trochanteric bursitis 01/2015    Past Surgical History  Procedure Laterality Date  . Cryotherapy    . Hernia repair  age 17    LIH  . Knee surgery Right     twice--Dr. Onnie Graham, Dr. Lynann Bologna (25 years ago)  . Hysterotomy    . Combined hysterectomy vaginal / oophorectomy / a&p repair  2008    pt states she has her ovaries  . Dilation and curettage of uterus      There were no vitals filed for this visit.  Visit Diagnosis:  Elbow pain, chronic, right      Subjective Assessment - 08/12/15 0737    Subjective I'm OK.  Right forearm pain.  I didn't buy the tennis elbow brace.  It was flared up when I left here.  I use a soup can for strengthening.  I don't wake up now during the night.  Overall about 60% better.  I still modify a lot of things.  I can carry my coffee cup but wringing out a wash cloth would hurt.  Knee is better now.  My orthotics are working.     Currently in Pain?  Yes   Pain Score 5    Pain Location Arm   Pain Orientation Right   Pain Type Chronic pain   Pain Onset More than a month ago   Aggravating Factors  end of the day, wringing out washcloth; lifting object                         [facility] Adult PT Treatment/Exercise - 08/12/15 0001    Elbow Exercises   Forearm Supination AROM;Right;5 reps   Wrist Extension Limitations Review HEP to discuss modifications   Moist Heat Therapy   Number Minutes Moist Heat 10 Minutes   Moist Heat Location Elbow   Manual Therapy   Soft tissue mobilization Instrument assisted Graston G4 to triceps   Myofascial Release triceps, ECRB          Trigger Point Dry Needling - 08/12/15 0839    Consent Given? Yes   Muscles Treated Upper Body --  Right ECRB                PT Short Term Goals - 08/12/15 [id number]  PT SHORT TERM GOAL #1   Title be independent in initial HEP   Status Achieved   PT SHORT TERM GOAL #2   Title report a 30% reduction in Rt elbow pain with use   Status Achieved   PT SHORT TERM GOAL #3   Title use Rt UE > 75% with work and home tasks   Status Achieved           PT Long Term Goals - 08/12/15 0737    PT LONG TERM GOAL #1   Title be independent in advanced HEP   Time 8   Period Weeks   Status On-going   PT LONG TERM GOAL #2   Title reduce FOTO to < or = to 40% limitation   Time 8   Period Weeks   Status On-going   PT LONG TERM GOAL #3   Title report a 60% reduction in Rt elbow pain with use   Time 8   Period Weeks   Status On-going   PT LONG TERM GOAL #4   Title report >90% use of Rt UE with home and work tasks   Time 8   Period Weeks   Status On-going   PT LONG TERM GOAL #5   Title report a 50% reduction in Lt knee pain with standing and walking   Status Achieved               Plan - 08/12/15 1349    Clinical Impression Statement The patient reports her pain is primarily in her forearm now.  Originally in shoulder and triceps in  addition to forearm.  She reports continued pain with llifting something or doing a wringing motion.  She has initiated an eccentric strengthening program although very light resistance only.  She has had numerous interventions including dry needling, joint and soft tissue mob, taping and ionto as well.  She continues to progress although slowly secondary to chronicity of the problem.  Recommend continuation of PT at least 2 more visits, then will assess progress toward goals to determine need for additional services.     PT Next Visit Plan Assess response to dry needle #4; manual therapy; strengthening as tolerated;  Graston instrument assisted manual;  taping as needed        Problem List Patient Active Problem List   Diagnosis Date Noted  . Left knee pain 06/02/2015  . Abnormality of gait 04/29/2015  . Lateral epicondylitis of right elbow 03/18/2015  . Pain in joint, ankle and foot 03/18/2015  . OSA on CPAP 01/30/2015  . Right hip pain 01/08/2015  . Right lateral epicondylitis 10/31/2014  . Hot flushes, perimenopausal  HT began 11/2013 10/28/2013  . Multinodular goiter 07/25/2013  . Anxiety 07/25/2013  . Asthma 07/25/2013  . DJD (degenerative joint disease) 07/25/2013  . Depression 07/25/2013  . Hyperlipidemia 07/25/2013  . S/P hysterectomy 07/25/2013  . Genital herpes 07/25/2013  . Allergic rhinitis 07/25/2013  . Sleep apnea 07/25/2013  . Urinary incontinence 07/25/2013  . Vertigo 07/25/2013  . Hip bursitis 07/25/2013    [name] 08/12/2015, 1:55 PM  [facility] [address] [place], [place], [address] Phone: [phone]   Fax:  [phone]  Name: KETRINA MURCHISON MRN: [id number] Date of Birth: [birth year]   Ruben Im, PT 08/12/2015 1:56 PM Phone: [phone] Fax: [phone]

## 2015-08-13 ENCOUNTER — Encounter: Payer: Self-pay | Admitting: Sports Medicine

## 2015-08-13 ENCOUNTER — Ambulatory Visit (INDEPENDENT_AMBULATORY_CARE_PROVIDER_SITE_OTHER): Payer: Commercial Managed Care - PPO | Admitting: Sports Medicine

## 2015-08-13 VITALS — BP 128/67 | HR 92 | Ht 66.0 in | Wt 180.0 lb

## 2015-08-13 DIAGNOSIS — M7711 Lateral epicondylitis, right elbow: Secondary | ICD-10-CM

## 2015-08-13 MED ORDER — AMITRIPTYLINE HCL 25 MG PO TABS: 25.0000 mg | ORAL_TABLET | Freq: Every day | ORAL | Status: DC

## 2015-08-13 NOTE — Progress Notes (Signed)
Debra Hardin - 55 y.o. female MRN [id number]  Date of birth: [birth year]   PCP: Vikki Ports, MD  Subjective:   HPI: Patient is a 55 y.o. female here for right elbow pain  4/28 with Dr. Barbaraann Barthel: Patient reports for about 2 months she's had worsening lateral right elbow pain into the forearm. Difficulty typing, using coffee cup. Has been icing, taking ibuprofen. No known injury or trauma. Pain level 3/10 currently. Some localized swelling. Impacting her ability to play guitar. Right handed. --Injection performed at this visit provided good relief for several months.    11/21 with Dr. Barbaraann Barthel: Patient reports her right elbow has been hurting laterally still at 4/10 level. Feels like a soreness. Tried home exercises, injection at last office visit with improvement but has worsened again. Worse with wrist motions, opening doors. Left knee has hurt for 1 month Worse going up and down stairs, soreness medially. Hard to straighten out leg. Pain level 5/10. No skin changes, fever, other complaints.  Today:  Pain is still persistent and ongoing since February 2016. She denies any numbness or tingling radiating down to her hand. She has been continuing with physical therapy although she states she has 2 more visits contained within their normal treatment protocol. She states she is able to pick up a coffee cup which she could not do initially. 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 She has tried nitroglycerin patches for 2 months as well as oral diclofenac and one injection last year. She is also tried capsaicin cream. She also takes trazodone and Prozac. She is also wearing a body helix brace occasionally and denies taking any swelling in her hands. She is really unable to play her guitar very often.  Past Medical History  Diagnosis Date  . Asthma   . HSV-2 (herpes simplex virus 2) infection   . Anxiety     Dr. [name]  . Depression   .  Insomnia   . Vaginal dryness   . Bronchitis   . OSA (obstructive sleep apnea)     CPAP (uses sporadically)  . Hyperlipidemia   . Multiple thyroid nodules 03/2014    more prominent on L>R; h/o benign bx. f/u 1 yr  . Lateral epicondylitis right; 09/2014    Dr. [name]  . Trochanteric bursitis 01/2015    Current Outpatient Prescriptions on File Prior to Visit  Medication Sig Dispense Refill  . albuterol (PROVENTIL HFA;VENTOLIN HFA) 108 (90 BASE) MCG/ACT inhaler Inhale 2 puffs into the lungs every 6 (six) hours as needed for wheezing or shortness of breath. 1 Inhaler 1  . ALPRAZolam (XANAX) 0.5 MG tablet Take 1 tablet (0.5 mg total) by mouth at bedtime as needed for anxiety. 20 tablet 0  . azithromycin (ZITHROMAX) 250 MG tablet Take 2 tabs PO x 1 dose, then 1 tab PO QD x 4 days 6 tablet 0  . Calcium Carbonate-Vitamin D (CALCIUM PLUS VITAMIN D PO) Take 1 tablet by mouth daily.    . cetirizine (ZYRTEC) 10 MG tablet Take 10 mg by mouth daily.    . diclofenac (VOLTAREN) 75 MG EC tablet Take 1 tablet (75 mg total) by mouth 2 (two) times daily. 60 tablet 1  . estradiol (VIVELLE-DOT) 0.05 MG/24HR patch PLACE ONTO SKIN AND CHANGE TWICE A WEEK 8 patch 4  . FLUoxetine (PROZAC) 40 MG capsule Take 1 capsule (40 mg total) by mouth daily. 90 capsule 1  . mometasone-formoterol (DULERA) 100-5 MCG/ACT  AERO INHALE TWO PUFFS INTO LUNGS TWICE DAILY 1 Inhaler 2  . montelukast (SINGULAIR) 10 MG tablet Take 1 tablet (10 mg total) by mouth at bedtime. 90 tablet 1  . MULTIPLE VITAMIN PO Take by mouth.    . nitroGLYCERIN (NITRODUR - DOSED IN MG/24 HR) 0.2 mg/hr patch Apply 1/4th patch to affected elbow, change daily 30 patch 1  . Omega-3 Fatty Acids (FISH OIL) 600 MG CAPS Take by mouth.    . pravastatin (PRAVACHOL) 40 MG tablet Take 1 tablet (40 mg total) by mouth daily. 90 tablet 1  . traZODone (DESYREL) 150 MG tablet Take 1 tablet (150 mg total) by mouth at bedtime. 90 tablet 1  . triamcinolone (NASACORT) 55  MCG/ACT AERO nasal inhaler Place 2 sprays into the nose daily.    . TURMERIC PO Take 1 capsule by mouth daily.    . valACYclovir (VALTREX) 500 MG tablet Take one daily 90 tablet 1   No current facility-administered medications on file prior to visit.    Past Surgical History  Procedure Laterality Date  . Cryotherapy    . Hernia repair  age 45    LIH  . Knee surgery Right     twice--Dr. Onnie Graham, Dr. Lynann Bologna (25 years ago)  . Hysterotomy    . Combined hysterectomy vaginal / oophorectomy / a&p repair  2008    pt states she has her ovaries  . Dilation and curettage of uterus      Allergies  Allergen Reactions  . Codeine     Social History   Social History  . Marital Status: Married    Spouse Name: N/A  . Number of Children: N/A  . Years of Education: N/A   Occupational History  . Not on file.   Social History Main Topics  . Smoking status: Never Smoker   . Smokeless tobacco: Never Used  . Alcohol Use: 0.0 oz/week    0 Standard drinks or equivalent per week     Comment: occasional (up to 4/week, not every week)  . Drug Use: No  . Sexual Activity: Yes    Birth Control/ Protection: Surgical     Comment: hysterectomy    Other Topics Concern  . Not on file   Social History Narrative   Married.  2 daughters (one of whom has 2 rats).  Oldest is in college.   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 Bone cancer Paternal Grandmother   . Stroke Maternal Grandmother   . Heart attack Maternal Grandfather   . Stomach cancer Maternal Grandfather   . Heart attack Father 42    heart attacks at 41 and 46  . Alcohol abuse Father   . Hyperlipidemia Father   . Heart disease Mother   . Depression Mother   . Colon cancer Maternal Uncle   . Depression Sister   . Alcohol abuse Brother   . Hyperlipidemia Brother   . Anxiety disorder Brother   . Mental illness Daughter   . Alcohol abuse Sister   . Hyperlipidemia Sister   .  Depression Sister   . Anxiety disorder Sister   . Diabetes Maternal Uncle     BP 128/67 mmHg  Pulse 92  Ht 5\' 6"  (1.676 m)  Wt 180 lb (81.647 kg)  BMI 29.07 kg/m2  Review of Systems: See HPI above.    Objective:  Physical Exam:  Gen: NAD  Right elbow: No  gross deformity or bruising although there does seem to be some soft tissue swelling and mild bruising over the posterior aspect of the mid forearm. Tender to palpation over the lateral epicondyle   FROM with pain on resisted wrist extension, pronation. Positive blood test Collateral ligaments intact. NVI distally.  Ultrasound: Ultrasound of the right elbow and long and short axis shows normal extensor wad insertion onto the lateral epicondyle without any increase in vascularity. There are no discrete tears or swelling. Triceps insertion is unremarkable. Overall this is a normal ultrasound of the lateral elbow.    Assessment & Plan:  1. Right lateral epicondylitis - this does seem consistent with lateral epicondylitis although the duration and refractory nature make alternative diagnoses possible including PIN entrapment. She does not report any neuropathic symptoms making this less likely. We discussed the possibility of a nerve conduction velocity test but elected to go for symptomatic management to begin. - We're be starting amitriptyline 25 mg 1-2 tabs nightly - Discontinue trazodone and continue Prozac - Use over-the-counter Arnica gel 3-4 times a day - Discussed using a wrist guard at next month's appointment - Continue with last 2 sessions of scheduled PT and consider continuation if this seems to be helpful.  - Continue with body helix elbow brace if not causing any distal forearm or hand swelling. - Follow up in one month.

## 2015-08-13 NOTE — Patient Instructions (Signed)
This does appear similar to tennis elbow, but a nerve entrapment is also a strong possibility.  - Begin 1-2 amitriptyline 25mg  tabs 30 minutes before bedtime.   - Discontinue your trazadone - Begin using arnica gel several times a day, ~3-4 times.  - We can consider using a wrist guard next month. - Continue with your last 2 sessions of PT & consider a continuation based on how well you're doing.  Continue your home exercise plan.  - You can wear your body helix brace.  The primary reason to reduce usage is if you develop adverse symptoms such as swelling in your hand - f/u 1 month.

## 2015-08-14 ENCOUNTER — Ambulatory Visit: Payer: Commercial Managed Care - PPO | Admitting: Physical Therapy

## 2015-08-14 NOTE — Telephone Encounter (Signed)
Finished

## 2015-08-18 ENCOUNTER — Ambulatory Visit: Payer: Commercial Managed Care - PPO

## 2015-08-18 DIAGNOSIS — G8929 Other chronic pain: Secondary | ICD-10-CM

## 2015-08-18 DIAGNOSIS — M25521 Pain in right elbow: Principal | ICD-10-CM

## 2015-08-18 NOTE — Therapy (Signed)
[facility] [address], [place], [place], [address] Phone: [phone]   Fax:  [phone]  Physical Therapy Treatment  Patient Details  Name: Debra Hardin MRN: [id number] Date of Birth: 10-27-1960 Referring Provider: [name], MD  Encounter Date: 08/18/2015      PT End of Session - 08/18/15 1650    Visit Number 13   PT Start Time 9518   PT Stop Time 1655   PT Time Calculation (min) 40 min   Activity Tolerance Patient tolerated treatment well   Behavior During Therapy [facility] for tasks assessed/performed      Past Medical History  Diagnosis Date  . Asthma   . HSV-2 (herpes simplex virus 2) infection   . Anxiety     Dr. [name]  . Depression   . Insomnia   . Vaginal dryness   . Bronchitis   . OSA (obstructive sleep apnea)     CPAP (uses sporadically)  . Hyperlipidemia   . Multiple thyroid nodules 03/2014    more prominent on L>R; h/o benign bx. f/u 1 yr  . Lateral epicondylitis right; 09/2014    Dr. [name]  . Trochanteric bursitis 01/2015    Past Surgical History  Procedure Laterality Date  . Cryotherapy    . Hernia repair  age 31    LIH  . Knee surgery Right     twice--Dr. Onnie Graham, Dr. Lynann Bologna (25 years ago)  . Hysterotomy    . Combined hysterectomy vaginal / oophorectomy / a&p repair  2008    pt states she has her ovaries  . Dilation and curettage of uterus      There were no vitals filed for this visit.  Visit Diagnosis:  Elbow pain, chronic, right      Subjective Assessment - 08/18/15 1619    Subjective 60-75% overall improvement in Rt elbow pain with PT.  Ready to D/C to HEP.     Currently in Pain? Yes   Pain Score 3    Pain Location Elbow   Pain Orientation Right   Pain Descriptors / Indicators Aching   Pain Type Chronic pain   Pain Onset More than a month ago   Pain Frequency Constant   Aggravating Factors  computer/phone use, use of Rt UE   Pain Relieving Factors  rest, heat            [facility] PT Assessment - 08/18/15 0001    Assessment   Medical Diagnosis Rt elbow pain, Lt knee   Onset Date/Surgical Date 06/22/14   Observation/Other Assessments   Focus on Therapeutic Outcomes (FOTO)  40% limitation                     [facility] Adult PT Treatment/Exercise - 08/18/15 0001    Shoulder Exercises: Standing   Extension Strengthening;Right;15 reps;Theraband   Theraband Level (Shoulder Extension) Level 2 (Red)  issued red and green band for advancement   Other Standing Exercises triceps ext with red band over top of door 15x   Shoulder Exercises: ROM/Strengthening   UBE (Upper Arm Bike) Level 1x 6 minutes (3/3)   Wrist Exercises   Other wrist exercises wrist extension and flexion stretch 2x20 seconds   Moist Heat Therapy   Number Minutes Moist Heat 10 Minutes   Moist Heat Location Elbow   Manual Therapy   Manual Therapy Soft tissue mobilization;Myofascial release   Myofascial Release wrist extenssor tendons  PT Short Term Goals - 08/12/15 0737    PT SHORT TERM GOAL #1   Title be independent in initial HEP   Status Achieved   PT SHORT TERM GOAL #2   Title report a 30% reduction in Rt elbow pain with use   Status Achieved   PT SHORT TERM GOAL #3   Title use Rt UE > 75% with work and home tasks   Status Achieved           PT Long Term Goals - 08/18/15 1621    PT LONG TERM GOAL #1   Title be independent in advanced HEP   Status Achieved   PT LONG TERM GOAL #2   Title reduce FOTO to < or = to 40% limitation   Status Achieved   PT LONG TERM GOAL #3   Title report a 60% reduction in Rt elbow pain with use   Status Achieved   PT LONG TERM GOAL #4   Title report >90% use of Rt UE with home and work tasks   Status Partially Met  60%    PT LONG TERM GOAL #5   Title report a 50% reduction in Lt knee pain with standing and walking   Status Achieved               Plan - 08/18/15 1624     Clinical Impression Statement Pt reports 60-75% overall improvement since the start of care regarding Rt elbow pain.  Pt continues to limit use to 60% for home and work tasks.  Pt reports continued pain with lifting something heavy or opening a jar.  Pt has HEP for gentle resistance with theraband and soup can and will continue with this for continued gains.  Pt will follow up with MD in one month.     PT Next Visit Plan D/C PT to HEP   Consulted and Agree with Plan of Care Patient        Problem List Patient Active Problem List   Diagnosis Date Noted  . Left knee pain 06/02/2015  . Abnormality of gait 04/29/2015  . Lateral epicondylitis of right elbow 03/18/2015  . Pain in joint, ankle and foot 03/18/2015  . OSA on CPAP 01/30/2015  . Right hip pain 01/08/2015  . Right lateral epicondylitis 10/31/2014  . Hot flushes, perimenopausal  HT began 11/2013 10/28/2013  . Multinodular goiter 07/25/2013  . Anxiety 07/25/2013  . Asthma 07/25/2013  . DJD (degenerative joint disease) 07/25/2013  . Depression 07/25/2013  . Hyperlipidemia 07/25/2013  . S/P hysterectomy 07/25/2013  . Genital herpes 07/25/2013  . Allergic rhinitis 07/25/2013  . Sleep apnea 07/25/2013  . Urinary incontinence 07/25/2013  . Vertigo 07/25/2013  . Hip bursitis 07/25/2013  PHYSICAL THERAPY DISCHARGE SUMMARY  Visits from Start of Care: 13  Current functional level related to goals / functional outcomes: See above.  Pt will follow-up with MD in 1 month to discuss progress.  She will continue with HEP.     Remaining deficits: See above.  Limited use of Rt UE with phone and computer.    Education / Equipment: HEP Plan: Patient agrees to discharge.  Patient goals were partially met. Patient is being discharged due to being pleased with the current functional level.  ?????      Edwin Cherian, PT 08/18/2015, 4:52 PM  [facility] [address] [place], [place], [address] Phone: [phone]   Fax:  [phone]  Name: Debra Hardin  MRN: [id number] Date of Birth: May 05, 1961

## 2015-08-20 ENCOUNTER — Ambulatory Visit: Payer: Commercial Managed Care - PPO

## 2015-08-28 LAB — HM MAMMOGRAPHY: HM MAMMO: NEGATIVE

## 2015-09-01 ENCOUNTER — Encounter: Payer: Self-pay | Admitting: *Deleted

## 2015-09-10 ENCOUNTER — Ambulatory Visit: Payer: Commercial Managed Care - PPO | Admitting: Sports Medicine

## 2015-10-04 ENCOUNTER — Other Ambulatory Visit: Payer: Self-pay | Admitting: Family Medicine

## 2015-10-09 ENCOUNTER — Encounter: Payer: Commercial Managed Care - PPO | Admitting: Family Medicine

## 2015-10-22 ENCOUNTER — Other Ambulatory Visit: Payer: Self-pay | Admitting: *Deleted

## 2015-10-22 MED ORDER — DICLOFENAC SODIUM 75 MG PO TBEC: 75.0000 mg | DELAYED_RELEASE_TABLET | Freq: Two times a day (BID) | ORAL | Status: DC

## 2015-12-02 ENCOUNTER — Encounter: Payer: Self-pay | Admitting: Sports Medicine

## 2015-12-02 ENCOUNTER — Ambulatory Visit (INDEPENDENT_AMBULATORY_CARE_PROVIDER_SITE_OTHER): Payer: Commercial Managed Care - PPO | Admitting: Sports Medicine

## 2015-12-02 VITALS — BP 128/58 | HR 64 | Ht 66.0 in | Wt 180.0 lb

## 2015-12-02 DIAGNOSIS — R269 Unspecified abnormalities of gait and mobility: Secondary | ICD-10-CM

## 2015-12-02 DIAGNOSIS — M25579 Pain in unspecified ankle and joints of unspecified foot: Secondary | ICD-10-CM

## 2015-12-02 NOTE — Assessment & Plan Note (Signed)
Good pain relief with orthotics  We will try to expand to all types of shoes

## 2015-12-02 NOTE — Progress Notes (Signed)
  Debra Hardin - 55 y.o. female MRN [id number]  Date of birth: 02/07/1961  SUBJECTIVE:     Debra Hardin is a 55 y.o. female following up for foot pain.   She has been evaluated for bilateral foot pain in the past, improved dramatically by orthotics fabricated here in October 2016, and she requests a 2nd pair for open-toed shoes. She still has occasional bilateral foot pain when bearing weight for prolonged periods at work, but this is significantly improved. This pain is in the forefoot. She reports hip pain present previously is now resolved.   ROS:     No numbness, tingling, weakness.   PERTINENT  PMH / PSH FH / / SH:  Past Medical, Surgical, Social, and Family History Reviewed & Updated in the EMR.  Pertinent findings include:  Healthy, no foot/ankle fractures.  Works as Animal nutritionist  OBJECTIVE: BP 128/58 mmHg  Pulse 64  Ht 5\' 6"  (1.676 m)  Wt 180 lb (81.647 kg)  BMI 29.07 kg/m2  Physical Exam:  Vital signs are reviewed. Gen: Well-appearing 55 y.o.female in NAD Right foot: TMT bossing evident and mild hallux valgus deformity noted on inspection without skin breakdown. Shortened first ray. Preserved transverse arch, but loss of longitudinal arch.  Left foot: TMT bossing, mild hallux valgus with hallux rigidus. First ray shortened with loss of transverse and longitudinal arches.   ASSESSMENT & PLAN: 55 y.o. female presenting for bilateral foot pain due largely to first metatarsal insufficiency bilaterally.   - Fabricated orthotics for sandals bilaterally with first ray post. Metatarsal pad added on left. Gait assessment with orthotics revealed no pain.  - Continue home exercise program.  - Follow up as needed

## 2015-12-02 NOTE — Assessment & Plan Note (Signed)
Orthotics preserve her gait and correct pronation  Patient was fitted for a : standard, cushioned, semi-rigid orthotic. The orthotic was heated and afterward the patient stood on the orthotic blank positioned on the orthotic stand. The patient was positioned in subtalar neutral position and 10 degrees of ankle dorsiflexion in a weight bearing stance. After completion of molding, a stable base was applied to the orthotic blank. The blank was ground to a stable position for weight bearing. Size: 6 dress orthotics Base: Blue foam Posting: first ray Additional orthotic padding: Left Med MT pad  I spent 40 mins on assessment and preparation of her orthotics. Greater 50% of time spent face to face counselling on foot health and use of orthotics in different shoe types

## 2015-12-23 ENCOUNTER — Other Ambulatory Visit: Payer: Self-pay | Admitting: Internal Medicine

## 2015-12-23 DIAGNOSIS — E042 Nontoxic multinodular goiter: Secondary | ICD-10-CM

## 2015-12-24 ENCOUNTER — Other Ambulatory Visit: Payer: Commercial Managed Care - PPO

## 2015-12-29 ENCOUNTER — Ambulatory Visit
Admission: RE | Admit: 2015-12-29 | Discharge: 2015-12-29 | Disposition: A | Discharge status: Home / Self Care | Payer: Commercial Managed Care - PPO | Source: Ambulatory Visit | Attending: Internal Medicine | Admitting: Internal Medicine

## 2015-12-29 DIAGNOSIS — E042 Nontoxic multinodular goiter: Secondary | ICD-10-CM

## 2016-01-30 ENCOUNTER — Encounter: Payer: Self-pay | Admitting: Internal Medicine

## 2016-01-30 ENCOUNTER — Ambulatory Visit (INDEPENDENT_AMBULATORY_CARE_PROVIDER_SITE_OTHER): Payer: Commercial Managed Care - PPO | Admitting: Internal Medicine

## 2016-01-30 VITALS — BP 110/80 | HR 71 | Temp 98.0°F | Wt 185.0 lb

## 2016-01-30 DIAGNOSIS — E042 Nontoxic multinodular goiter: Secondary | ICD-10-CM

## 2016-01-30 NOTE — Progress Notes (Signed)
Pre visit review using our clinic review tool, if applicable. No additional management support is needed unless otherwise documented below in the visit note. 

## 2016-01-30 NOTE — Progress Notes (Signed)
Patient ID: Debra Hardin, female   DOB: 04-18-1961, 55 y.o.   MRN: [id number]   HPI  Debra Hardin is a 55 y.o.-year-old female, initially referred by her PCP, Dr. [name], returning for f/u for MNG.   She was dx with MNG in 2011. At that time, she was seeing Dr Altheimer.  She had one nodule Bx'ed then >> L side.  She had a thyroid U/S in 2015 that showed 4 nodules: 2 in the R and 2 in the L lobe. She had a recent U/S (12/2015) showing stable nodules.  Thyroid U/S (03/12/2014):  Right thyroid lobe: 5.1 x 1.9 x 2.8 cm.  - Predominantly cystic and partially solid dominant nodule in the midportion of the right lobe measures 1.9 x 1.1 x 1.5 cm. This nodule is similar in  size to the prior study and shows further cystic degeneration compared to prior ultrasound.  - Complex, predominantly cystic nodule in the inferior right lobe measures approximately 1.1 x 1.0 x 1.1 cm.   Left thyroid lobe: 5.2 x 1.5 x 2.2 cm.  - Nodular area in the midportion of the left lobe measures approximately 3.0 x 1.3 x 1.5 cm. This may actually be a conglomeration of 2-3 nodular areas  merging into one another. (previously Bx'ed) - Nodular area in the inferior left lobe measures 1.6 x 1.1 x 1.2 cm.   Isthmus Thickness: 0.2 cm. No nodules visualized.   Lymphadenopathy: None visualized.  New thyroid U/S (12/29/2015):  Right thyroid lobe: 5.3 x 2.0 x 2.8 cm. Multiple nodules are again seen.  The dominant nodule lies in the midportion of the right lobe measuring 1.9 x 1.1 x 1.5 cm. It has a mixed solid and cystic  appearance and is stable from the prior exam consistent with a degenerating nodule.   An adjacent 1.2 cm predominately hyperechoic lesion is better visualized on the current exam.   A more inferior 1.1 cm nodule is again noted and stable.  Left thyroid lobe: 5.0 x 1.6 x 2.2 cm.  A dominant 3.3 x 1.4 x 1.6 cm nodule is noted and stable in appearance.  A smaller lower pole 1.5 cm solid nodule is noted and  stable from the prior exam.  A smaller 0.9 x 0.6 x 0.3 cm nodule is noted in the upper pole is slightly better visualized on the current examination.  Isthmus Thickness: 0.2 cm.  No nodules visualized.  Lymphadenopathy: None visualized.  Multiple nodules bilaterally. These are relatively stable from the previous exam from 2 years previous.  I reviewed pt's thyroid tests: 10/07/2015: TSH 0.85 Lab Results  Component Value Date   TSH 0.936 10/30/2014   TSH 1.25 07/02/2014   TSH 0.31 (L) 04/22/2014   TSH 0.820 07/25/2013   FREET4 0.83 07/02/2014   FREET4 1.02 04/22/2014    Pt denies feeling nodules in neck, hoarseness, dysphagia/odynophagia, SOB with lying down. Some compression sxs in neck when turns head to the L.   Pt mentions: - no fatigue - better hot flushes - on estradiol patches - + weight loss then gain 20 lbs  - no tremors - no palpitations - no anxiety/depression - no hyperdefecation/constipation - no dry skin - no hair loss  Pt does not have a FH of thyroid ds. No FH of thyroid cancer. No h/o radiation tx to head or neck.  No seaweed or kelp, no recent contrast studies. + Recent steroid im inj for URI. No herbal supplements.   ROS: Constitutional: see HPI  Eyes: no blurry vision, no xerophthalmia ENT: no sore throat, no nodules palpated in throat, no dysphagia/odynophagia, no hoarseness Cardiovascular: no CP/SOB/palpitations/leg swelling Respiratory: no cough/SOB Gastrointestinal: no N/V/D/C Musculoskeletal:+ both: muscle/joint aches Skin: no rashes, + hair loss Neurological: no tremors/numbness/tingling/dizziness  I reviewed pt's medications, allergies, PMH, social hx, family hx, and changes were documented in the history of present illness. Otherwise, unchanged from my initial visit note. Past Medical History:  Diagnosis Date  . Anxiety    Dr. [name]  . Asthma   . Bronchitis   . Depression   . HSV-2 (herpes simplex virus 2) infection   .  Hyperlipidemia   . Insomnia   . Lateral epicondylitis right; 09/2014   Dr. [name]  . Multiple thyroid nodules 03/2014   more prominent on L>R; h/o benign bx. f/u 1 yr  . OSA (obstructive sleep apnea)    CPAP (uses sporadically)  . Trochanteric bursitis 01/2015  . Vaginal dryness    Past Surgical History:  Procedure Laterality Date  . COMBINED HYSTERECTOMY VAGINAL / OOPHORECTOMY / A&P REPAIR  2008   pt states she has her ovaries  . CRYOTHERAPY    . DILATION AND CURETTAGE OF UTERUS    . HERNIA REPAIR  age 15   [facility]  . HYSTEROTOMY    . KNEE SURGERY Right    twice--Dr. Onnie Graham, Dr. Lynann Bologna (25 years ago)   History   Social History  . Marital Status: Married    Spouse Name: N/A    Number of Children: 2   Occupational History  . School counselor - [employer] History Main Topics  . Smoking status: Never Smoker   . Smokeless tobacco: Never Used  . Alcohol Use: Wine, beer 1-4 drinks 2xa mo  . Drug Use: No  . Sexual Activity: Yes    Birth Control/ Protection: Surgical     Comment: hysterectomy    Current Outpatient Prescriptions on File Prior to Visit  Medication Sig Dispense Refill  . albuterol (PROVENTIL HFA;VENTOLIN HFA) 108 (90 BASE) MCG/ACT inhaler Inhale 2 puffs into the lungs every 6 (six) hours as needed for wheezing or shortness of breath. 1 Inhaler 1  . ALPRAZolam (XANAX) 0.5 MG tablet Take 1 tablet (0.5 mg total) by mouth at bedtime as needed for anxiety. 20 tablet 0  . amitriptyline (ELAVIL) 25 MG tablet Take 1 tablet (25 mg total) by mouth at bedtime. (Patient not taking: Reported on 12/02/2015) 60 tablet 2  . azithromycin (ZITHROMAX) 250 MG tablet Take 2 tabs PO x 1 dose, then 1 tab PO QD x 4 days 6 tablet 0  . beclomethasone (QVAR) 80 MCG/ACT inhaler Inhale into the lungs.    . Calcium Carbonate-Vitamin D (CALCIUM PLUS VITAMIN D PO) Take 1 tablet by mouth daily.    . cetirizine (ZYRTEC) 10 MG tablet Take 10 mg by mouth daily.    . diclofenac  (VOLTAREN) 75 MG EC tablet Take 1 tablet (75 mg total) by mouth 2 (two) times daily. 60 tablet 1  . estradiol (VIVELLE-DOT) 0.05 MG/24HR patch PLACE ONTO SKIN AND CHANGE TWICE A WEEK 8 patch 4  . FLUoxetine (PROZAC) 40 MG capsule Take 1 capsule (40 mg total) by mouth daily. 90 capsule 1  . mometasone-formoterol (DULERA) 100-5 MCG/ACT AERO INHALE TWO PUFFS INTO LUNGS TWICE DAILY 1 Inhaler 2  . montelukast (SINGULAIR) 10 MG tablet Take 1 tablet (10 mg total) by mouth at bedtime. 90 tablet 1  . MULTIPLE VITAMIN PO Take  by mouth.    . nitroGLYCERIN (NITRODUR - DOSED IN MG/24 HR) 0.2 mg/hr patch Apply 1/4th patch to affected elbow, change daily 30 patch 1  . Omega-3 Fatty Acids (FISH OIL) 600 MG CAPS Take by mouth.    . pravastatin (PRAVACHOL) 40 MG tablet Take 1 tablet (40 mg total) by mouth daily. 90 tablet 1  . traZODone (DESYREL) 150 MG tablet Take 1 tablet (150 mg total) by mouth at bedtime. 90 tablet 1  . triamcinolone (NASACORT) 55 MCG/ACT AERO nasal inhaler Place 2 sprays into the nose daily.    . TURMERIC PO Take 1 capsule by mouth daily.    . valACYclovir (VALTREX) 500 MG tablet Take one daily 90 tablet 1   No current facility-administered medications on file prior to visit.    Allergies  Allergen Reactions  . Codeine    Family History  Problem Relation Age of Onset  . Bone cancer Paternal Grandmother   . Stroke Maternal Grandmother   . Heart attack Maternal Grandfather   . Stomach cancer Maternal Grandfather   . Heart attack Father 42    heart attacks at 59 and 74  . Alcohol abuse Father   . Hyperlipidemia Father   . Heart disease Mother   . Depression Mother   . Colon cancer Maternal Uncle   . Depression Sister   . Alcohol abuse Brother   . Hyperlipidemia Brother   . Anxiety disorder Brother   . Mental illness Daughter   . Alcohol abuse Sister   . Hyperlipidemia Sister   . Depression Sister   . Anxiety disorder Sister   . Diabetes Maternal Uncle    PE: BP 110/80    Pulse 71   Temp 98 F (36.7 C) (Oral)   Wt 185 lb (83.9 kg)   SpO2 95%   BMI 29.86 kg/m  Wt Readings from Last 3 Encounters:  01/30/16 185 lb (83.9 kg)  12/02/15 180 lb (81.6 kg)  08/13/15 180 lb (81.6 kg)   Constitutional: overweight, in NAD Eyes: PERRLA, EOMI, no exophthalmos ENT: moist mucous membranes, + thyromegaly L>R, no cervical lymphadenopathy Cardiovascular: RRR, No MRG Respiratory: CTA B Gastrointestinal: abdomen soft, NT, ND, BS+ Musculoskeletal: no deformities, strength intact in all 4;  Skin: moist, warm, no rashes Neurological: no tremor with outstretched hands, DTR normal in all 4  ASSESSMENT: 1. MNG - thyroid U/S (2015):   - thyroid U/S (2017): stable aspect and size of nodules  PLAN: 1. MNG  - I reviewed the report of her most recent thyroid ultrasound along with the patient (I also reviewed the images). The nodules appear stable in size and aspect. The dominant nodules are not very large (the 3 cm R nodule is a conglomerate of 2 separate nodules). The L thyroid nodule is mostly cystic, and the solid component appears degenerated. The other R nodule is <1.5 cm, w/o concerning features. The large L thyroid nodule was already Bx'ed in 2011, while the smaller L nodule is isoechoic and w/o concerning features. Pt does not have a thyroid cancer family history or a personal history of RxTx to head/neck. All these would favor benignity.  - since there are no neck compression sxs (only some discomfort when swallowing large pills), we decided to repeat the U/S in 2 years and decide whether we need a new Bx or not at that time. - she agrees with the planan  - reviewed recent TSH form 3 mo ago >> normal - she will let me know  if she develops neck compression sxs before next visit, in 2 years.  Philemon Kingdom, MD PhD [facility]

## 2016-01-30 NOTE — Patient Instructions (Addendum)
Please return in 2 years. 

## 2016-05-11 ENCOUNTER — Ambulatory Visit
Admission: RE | Admit: 2016-05-11 | Discharge: 2016-05-11 | Disposition: A | Discharge status: Home / Self Care | Payer: Commercial Managed Care - PPO | Source: Ambulatory Visit | Attending: Internal Medicine | Admitting: Internal Medicine

## 2016-05-11 ENCOUNTER — Other Ambulatory Visit: Payer: Self-pay | Admitting: Internal Medicine

## 2016-05-11 DIAGNOSIS — J018 Other acute sinusitis: Secondary | ICD-10-CM

## 2016-05-18 ENCOUNTER — Other Ambulatory Visit: Payer: Self-pay | Admitting: Family Medicine

## 2016-05-19 NOTE — Telephone Encounter (Signed)
Is this okay to refill? 

## 2016-05-19 NOTE — Telephone Encounter (Signed)
She canceled the physical she was scheduled for in April.  Hasn't been seen since 01/2015 (for elbow).  I wouldn't want to make her come for a med check just for this (since she sees other doctors for other problems), but would like for her to come for a physical.  Have her schedule one and be put on cancellation list.  Refill #90 only, no additional refill

## 2016-07-09 DIAGNOSIS — E7800 Pure hypercholesterolemia, unspecified: Secondary | ICD-10-CM | POA: Diagnosis not present

## 2016-07-09 DIAGNOSIS — Z23 Encounter for immunization: Secondary | ICD-10-CM | POA: Diagnosis not present

## 2016-07-09 DIAGNOSIS — J4520 Mild intermittent asthma, uncomplicated: Secondary | ICD-10-CM | POA: Diagnosis not present

## 2016-07-09 DIAGNOSIS — E042 Nontoxic multinodular goiter: Secondary | ICD-10-CM | POA: Diagnosis not present

## 2016-09-13 DIAGNOSIS — J028 Acute pharyngitis due to other specified organisms: Secondary | ICD-10-CM | POA: Diagnosis not present

## 2016-10-21 ENCOUNTER — Other Ambulatory Visit: Payer: Self-pay | Admitting: Family Medicine

## 2016-10-25 ENCOUNTER — Other Ambulatory Visit: Payer: Self-pay | Admitting: Family Medicine

## 2016-12-23 DIAGNOSIS — E042 Nontoxic multinodular goiter: Secondary | ICD-10-CM | POA: Diagnosis not present

## 2016-12-23 DIAGNOSIS — N6019 Diffuse cystic mastopathy of unspecified breast: Secondary | ICD-10-CM | POA: Diagnosis not present

## 2016-12-23 DIAGNOSIS — Z0000 Encounter for general adult medical examination without abnormal findings: Secondary | ICD-10-CM | POA: Diagnosis not present

## 2016-12-23 DIAGNOSIS — Z136 Encounter for screening for cardiovascular disorders: Secondary | ICD-10-CM | POA: Diagnosis not present

## 2016-12-23 DIAGNOSIS — Z8619 Personal history of other infectious and parasitic diseases: Secondary | ICD-10-CM | POA: Diagnosis not present

## 2016-12-23 DIAGNOSIS — J4520 Mild intermittent asthma, uncomplicated: Secondary | ICD-10-CM | POA: Diagnosis not present

## 2016-12-27 DIAGNOSIS — Z1231 Encounter for screening mammogram for malignant neoplasm of breast: Secondary | ICD-10-CM | POA: Diagnosis not present

## 2017-03-03 DIAGNOSIS — H9202 Otalgia, left ear: Secondary | ICD-10-CM | POA: Diagnosis not present

## 2017-03-03 DIAGNOSIS — H6522 Chronic serous otitis media, left ear: Secondary | ICD-10-CM | POA: Diagnosis not present

## 2017-03-03 DIAGNOSIS — E7800 Pure hypercholesterolemia, unspecified: Secondary | ICD-10-CM | POA: Diagnosis not present

## 2017-04-06 DIAGNOSIS — J209 Acute bronchitis, unspecified: Secondary | ICD-10-CM | POA: Diagnosis not present

## 2017-04-06 DIAGNOSIS — J9801 Acute bronchospasm: Secondary | ICD-10-CM | POA: Diagnosis not present

## 2017-04-06 DIAGNOSIS — J309 Allergic rhinitis, unspecified: Secondary | ICD-10-CM | POA: Diagnosis not present

## 2017-04-19 DIAGNOSIS — J45909 Unspecified asthma, uncomplicated: Secondary | ICD-10-CM | POA: Diagnosis not present

## 2017-04-19 DIAGNOSIS — R05 Cough: Secondary | ICD-10-CM | POA: Diagnosis not present

## 2017-04-19 DIAGNOSIS — Z23 Encounter for immunization: Secondary | ICD-10-CM | POA: Diagnosis not present

## 2017-06-07 DIAGNOSIS — Z86018 Personal history of other benign neoplasm: Secondary | ICD-10-CM | POA: Diagnosis not present

## 2017-06-07 DIAGNOSIS — L814 Other melanin hyperpigmentation: Secondary | ICD-10-CM | POA: Diagnosis not present

## 2017-06-07 DIAGNOSIS — L820 Inflamed seborrheic keratosis: Secondary | ICD-10-CM | POA: Diagnosis not present

## 2017-06-07 DIAGNOSIS — D225 Melanocytic nevi of trunk: Secondary | ICD-10-CM | POA: Diagnosis not present

## 2017-06-07 DIAGNOSIS — Z23 Encounter for immunization: Secondary | ICD-10-CM | POA: Diagnosis not present

## 2017-06-15 DIAGNOSIS — H8112 Benign paroxysmal vertigo, left ear: Secondary | ICD-10-CM | POA: Diagnosis not present

## 2017-11-03 ENCOUNTER — Ambulatory Visit: Payer: Commercial Managed Care - PPO | Admitting: Sports Medicine

## 2017-12-21 DIAGNOSIS — Z1231 Encounter for screening mammogram for malignant neoplasm of breast: Secondary | ICD-10-CM | POA: Diagnosis not present

## 2017-12-26 DIAGNOSIS — E7800 Pure hypercholesterolemia, unspecified: Secondary | ICD-10-CM | POA: Diagnosis not present

## 2017-12-26 DIAGNOSIS — M545 Low back pain: Secondary | ICD-10-CM | POA: Diagnosis not present

## 2017-12-26 DIAGNOSIS — M62830 Muscle spasm of back: Secondary | ICD-10-CM | POA: Diagnosis not present

## 2017-12-26 DIAGNOSIS — Z0000 Encounter for general adult medical examination without abnormal findings: Secondary | ICD-10-CM | POA: Diagnosis not present

## 2017-12-26 DIAGNOSIS — M9903 Segmental and somatic dysfunction of lumbar region: Secondary | ICD-10-CM | POA: Diagnosis not present

## 2017-12-26 DIAGNOSIS — E042 Nontoxic multinodular goiter: Secondary | ICD-10-CM | POA: Diagnosis not present

## 2017-12-26 DIAGNOSIS — J4520 Mild intermittent asthma, uncomplicated: Secondary | ICD-10-CM | POA: Diagnosis not present

## 2017-12-27 DIAGNOSIS — M545 Low back pain: Secondary | ICD-10-CM | POA: Diagnosis not present

## 2017-12-27 DIAGNOSIS — M62830 Muscle spasm of back: Secondary | ICD-10-CM | POA: Diagnosis not present

## 2017-12-27 DIAGNOSIS — M9903 Segmental and somatic dysfunction of lumbar region: Secondary | ICD-10-CM | POA: Diagnosis not present

## 2017-12-28 DIAGNOSIS — M545 Low back pain: Secondary | ICD-10-CM | POA: Diagnosis not present

## 2017-12-28 DIAGNOSIS — M62830 Muscle spasm of back: Secondary | ICD-10-CM | POA: Diagnosis not present

## 2017-12-28 DIAGNOSIS — M9903 Segmental and somatic dysfunction of lumbar region: Secondary | ICD-10-CM | POA: Diagnosis not present

## 2018-01-09 DIAGNOSIS — M62830 Muscle spasm of back: Secondary | ICD-10-CM | POA: Diagnosis not present

## 2018-01-09 DIAGNOSIS — M9903 Segmental and somatic dysfunction of lumbar region: Secondary | ICD-10-CM | POA: Diagnosis not present

## 2018-01-09 DIAGNOSIS — M545 Low back pain: Secondary | ICD-10-CM | POA: Diagnosis not present

## 2018-01-11 DIAGNOSIS — M545 Low back pain: Secondary | ICD-10-CM | POA: Diagnosis not present

## 2018-01-11 DIAGNOSIS — M62830 Muscle spasm of back: Secondary | ICD-10-CM | POA: Diagnosis not present

## 2018-01-11 DIAGNOSIS — M9903 Segmental and somatic dysfunction of lumbar region: Secondary | ICD-10-CM | POA: Diagnosis not present

## 2018-01-12 DIAGNOSIS — R195 Other fecal abnormalities: Secondary | ICD-10-CM | POA: Diagnosis not present

## 2018-01-12 DIAGNOSIS — Z1211 Encounter for screening for malignant neoplasm of colon: Secondary | ICD-10-CM | POA: Diagnosis not present

## 2018-01-12 DIAGNOSIS — K625 Hemorrhage of anus and rectum: Secondary | ICD-10-CM | POA: Diagnosis not present

## 2018-01-20 DIAGNOSIS — R195 Other fecal abnormalities: Secondary | ICD-10-CM | POA: Diagnosis not present

## 2018-01-20 DIAGNOSIS — E785 Hyperlipidemia, unspecified: Secondary | ICD-10-CM | POA: Diagnosis not present

## 2018-01-20 DIAGNOSIS — J309 Allergic rhinitis, unspecified: Secondary | ICD-10-CM | POA: Diagnosis not present

## 2018-01-20 DIAGNOSIS — I4891 Unspecified atrial fibrillation: Secondary | ICD-10-CM | POA: Diagnosis not present

## 2018-01-20 DIAGNOSIS — Z1211 Encounter for screening for malignant neoplasm of colon: Secondary | ICD-10-CM | POA: Diagnosis not present

## 2018-01-23 DIAGNOSIS — M9903 Segmental and somatic dysfunction of lumbar region: Secondary | ICD-10-CM | POA: Diagnosis not present

## 2018-01-23 DIAGNOSIS — M62830 Muscle spasm of back: Secondary | ICD-10-CM | POA: Diagnosis not present

## 2018-01-23 DIAGNOSIS — M545 Low back pain: Secondary | ICD-10-CM | POA: Diagnosis not present

## 2018-01-24 ENCOUNTER — Ambulatory Visit (HOSPITAL_COMMUNITY)
Admission: RE | Admit: 2018-01-24 | Discharge: 2018-01-24 | Disposition: A | Discharge status: Home / Self Care | Payer: Commercial Managed Care - PPO | Source: Ambulatory Visit | Attending: Nurse Practitioner | Admitting: Nurse Practitioner

## 2018-01-24 ENCOUNTER — Encounter (HOSPITAL_COMMUNITY): Payer: Self-pay | Admitting: Nurse Practitioner

## 2018-01-24 VITALS — BP 112/68 | HR 90 | Ht 66.0 in | Wt 173.6 lb

## 2018-01-24 DIAGNOSIS — I481 Persistent atrial fibrillation: Secondary | ICD-10-CM | POA: Diagnosis not present

## 2018-01-24 DIAGNOSIS — I4891 Unspecified atrial fibrillation: Secondary | ICD-10-CM | POA: Diagnosis not present

## 2018-01-24 DIAGNOSIS — E785 Hyperlipidemia, unspecified: Secondary | ICD-10-CM | POA: Diagnosis not present

## 2018-01-24 DIAGNOSIS — Z90710 Acquired absence of both cervix and uterus: Secondary | ICD-10-CM | POA: Diagnosis not present

## 2018-01-24 DIAGNOSIS — Z885 Allergy status to narcotic agent status: Secondary | ICD-10-CM | POA: Insufficient documentation

## 2018-01-24 DIAGNOSIS — F329 Major depressive disorder, single episode, unspecified: Secondary | ICD-10-CM | POA: Insufficient documentation

## 2018-01-24 DIAGNOSIS — F419 Anxiety disorder, unspecified: Secondary | ICD-10-CM | POA: Diagnosis not present

## 2018-01-24 DIAGNOSIS — Z823 Family history of stroke: Secondary | ICD-10-CM | POA: Insufficient documentation

## 2018-01-24 DIAGNOSIS — Z8249 Family history of ischemic heart disease and other diseases of the circulatory system: Secondary | ICD-10-CM | POA: Diagnosis not present

## 2018-01-24 DIAGNOSIS — Z79899 Other long term (current) drug therapy: Secondary | ICD-10-CM | POA: Insufficient documentation

## 2018-01-24 DIAGNOSIS — Z9889 Other specified postprocedural states: Secondary | ICD-10-CM | POA: Insufficient documentation

## 2018-01-24 DIAGNOSIS — I4819 Other persistent atrial fibrillation: Secondary | ICD-10-CM

## 2018-01-24 DIAGNOSIS — G4733 Obstructive sleep apnea (adult) (pediatric): Secondary | ICD-10-CM | POA: Diagnosis not present

## 2018-01-24 MED ORDER — APIXABAN 5 MG PO TABS: 5.0000 mg | ORAL_TABLET | Freq: Two times a day (BID) | ORAL | 0 refills | Status: DC

## 2018-01-24 NOTE — Patient Instructions (Signed)
Start Eliquis 5mg twice a day 

## 2018-01-25 NOTE — Progress Notes (Signed)
Primary Care Physician: [name], MD Referring Physician: Same   Debra Hardin is a 57 y.o. female with a h/o multiple thyroid nodules, mild OSA in the past, not using cpap, that is the afib clinic for evaluation after being found to have new onset afib at the time of colonoscopy last week. Pt did go on to  have the procedure without event. She continues in afib today, rate controlled. She reports that she has noted fluttering in her chest for around one year. She does notice palpitations, fatigue, no unusual shortness of breath. Is drinking some alcohol, minimal caffeine now, no tobacco,. H/o of mildly sleep apnea by sleep study 8 years ago, never really used CPAP. Admits to a 10 lb weight gain over the last 6-9 months. She has a chadsvasc score of 1. Under stress for starting to get ready for school year, she works as a Social worker for special need students..   Today, she denies symptoms of palpitations, chest pain, shortness of breath, orthopnea, PND, lower extremity edema, dizziness, presyncope, syncope, or neurologic sequela. The patient is tolerating medications without difficulties and is otherwise without complaint today.   Past Medical History:  Diagnosis Date  . Anxiety    Dr. [name]  . Asthma   . Bronchitis   . Depression   . HSV-2 (herpes simplex virus 2) infection   . Hyperlipidemia   . Insomnia   . Lateral epicondylitis right; 09/2014   Dr. [name]  . Multiple thyroid nodules 03/2014   more prominent on L>R; h/o benign bx. f/u 1 yr  . OSA (obstructive sleep apnea)    CPAP (uses sporadically)  . Trochanteric bursitis 01/2015  . Vaginal dryness    Past Surgical History:  Procedure Laterality Date  . COMBINED HYSTERECTOMY VAGINAL / OOPHORECTOMY / A&P REPAIR  2008   pt states she has her ovaries  . CRYOTHERAPY    . DILATION AND CURETTAGE OF UTERUS    . HERNIA REPAIR  age 44   LI[facility]  . HYSTEROTOMY    . KNEE SURGERY Right    twice--Dr. Onnie Graham, Dr.  Lynann Bologna (25 years ago)    Current Outpatient Medications  Medication Sig Dispense Refill  . albuterol (PROVENTIL HFA;VENTOLIN HFA) 108 (90 BASE) MCG/ACT inhaler Inhale 2 puffs into the lungs every 6 (six) hours as needed for wheezing or shortness of breath. 1 Inhaler 1  . cetirizine (ZYRTEC) 10 MG tablet Take 10 mg by mouth daily.    Marland Kitchen estradiol (VIVELLE-DOT) 0.05 MG/24HR patch PLACE ONTO SKIN AND CHANGE TWICE A WEEK 8 patch 4  . montelukast (SINGULAIR) 10 MG tablet Take 1 tablet (10 mg total) by mouth at bedtime. 90 tablet 1  . traZODone (DESYREL) 100 MG tablet Take 100 mg by mouth at bedtime.    . valACYclovir (VALTREX) 500 MG tablet Take one daily 90 tablet 1  . apixaban (ELIQUIS) 5 MG TABS tablet Take 1 tablet (5 mg total) by mouth 2 (two) times daily. 60 tablet 0   No current facility-administered medications for this encounter.     Allergies  Allergen Reactions  . Codeine     Social History   Socioeconomic History  . Marital status: Married    Spouse name: Not on file  . Number of children: Not on file  . Years of education: Not on file  . Highest education level: Not on file  Occupational History  . Not on file  Social Needs  . Financial resource strain:  Not on file  . Food insecurity:    Worry: Not on file    Inability: Not on file  . Transportation needs:    Medical: Not on file    Non-medical: Not on file  Tobacco Use  . Smoking status: Never Smoker  . Smokeless tobacco: Never Used  Substance and Sexual Activity  . Alcohol use: Yes    Alcohol/week: 0.0 oz    Comment: occasional (up to 4/week, not every week)  . Drug use: No  . Sexual activity: Yes    Birth control/protection: Surgical    Comment: hysterectomy   Lifestyle  . Physical activity:    Days per week: Not on file    Minutes per session: Not on file  . Stress: Not on file  Relationships  . Social connections:    Talks on phone: Not on file    Gets together: Not on file    Attends religious  service: Not on file    Active member of club or organization: Not on file    Attends meetings of clubs or organizations: Not on file    Relationship status: Not on file  . Intimate partner violence:    Fear of current or ex partner: Not on file    Emotionally abused: Not on file    Physically abused: Not on file    Forced sexual activity: Not on file  Other Topics Concern  . Not on file  Social History Narrative   Married.  2 daughters (one of whom has 2 rats).  Oldest is in college.   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 Bone cancer Paternal Grandmother   . Stroke Maternal Grandmother   . Heart attack Maternal Grandfather   . Stomach cancer Maternal Grandfather   . Heart attack Father 42       heart attacks at 65 and 35  . Alcohol abuse Father   . Hyperlipidemia Father   . Heart disease Mother   . Depression Mother   . Colon cancer Maternal Uncle   . Depression Sister   . Alcohol abuse Brother   . Hyperlipidemia Brother   . Anxiety disorder Brother   . Mental illness Daughter   . Alcohol abuse Sister   . Hyperlipidemia Sister   . Depression Sister   . Anxiety disorder Sister   . Diabetes Maternal Uncle     ROS- All systems are reviewed and negative except as per the HPI above  Physical Exam: Vitals:   01/24/18 1539  BP: 112/68  Pulse: 90  Weight: 173 lb 9.6 oz (78.7 kg)  Height: 5\' 6"  (1.676 m)   Wt Readings from Last 3 Encounters:  01/24/18 173 lb 9.6 oz (78.7 kg)  01/30/16 185 lb (83.9 kg)  12/02/15 180 lb (81.6 kg)    Labs: Lab Results  Component Value Date   NA 137 10/30/2014   K 4.5 10/30/2014   CL 100 10/30/2014   CO2 28 10/30/2014   GLUCOSE 91 10/30/2014   BUN 13 10/30/2014   CREATININE 0.86 10/30/2014   CALCIUM 9.3 10/30/2014   No results found for: INR Lab Results  Component Value Date   CHOL 168 10/30/2014   HDL 68 10/30/2014   LDLCALC 90 10/30/2014   TRIG 50 10/30/2014      GEN- The patient is well appearing, alert and oriented x 3 today.   Head- normocephalic, atraumatic Eyes-  Sclera clear, conjunctiva pink Ears- hearing intact Oropharynx- clear Neck- supple, no JVP Lymph- no cervical lymphadenopathy Lungs- Clear to ausculation bilaterally, normal work of breathing Heart- Regular rate and rhythm, no murmurs, rubs or gallops, PMI not laterally displaced GI- soft, NT, ND, + BS Extremities- no clubbing, cyanosis, or edema MS- no significant deformity or atrophy Skin- no rash or lesion Psych- euthymic mood, full affect Neuro- strength and sensation are intact  EKG-afib at 90 bpm, qrs int 68 ms, qtc 408 ms Labs reviewed form physical in June CBC no anemia, plts 209, TSH 1.87, Creatinine 0.89    Assessment and Plan: 1. New onset afib General education re afib  She is currently rate controlled, will hold off with low dose BB at this point Echo Encouraged to repeat sleep study 2/2 high correlation between untreated sleep apnea and afib Decrease alcohol intake to no more thatn 2 drinks a week Limit caffeine, stimulants   2. CHA2DS2VASc score of 1 For now will have to assume she is persistent and may need cardioversion to restore SR I will start eliquis 5 mg bid Bleeding precautions discussed 30 day free coupon given I do not think she will need long term unless echo shows LV dysfunction  F/u in one week  Butch Penny C. Yael Coppess, [facility] [address] [place], [place] [address] [phone]

## 2018-01-26 ENCOUNTER — Ambulatory Visit: Payer: Self-pay

## 2018-01-26 ENCOUNTER — Ambulatory Visit (INDEPENDENT_AMBULATORY_CARE_PROVIDER_SITE_OTHER): Payer: Commercial Managed Care - PPO | Admitting: Sports Medicine

## 2018-01-26 ENCOUNTER — Encounter: Payer: Self-pay | Admitting: Sports Medicine

## 2018-01-26 VITALS — BP 104/78 | Ht 66.0 in | Wt 169.0 lb

## 2018-01-26 DIAGNOSIS — M25552 Pain in left hip: Principal | ICD-10-CM

## 2018-01-26 DIAGNOSIS — M6281 Muscle weakness (generalized): Secondary | ICD-10-CM | POA: Diagnosis not present

## 2018-01-26 DIAGNOSIS — M25551 Pain in right hip: Secondary | ICD-10-CM

## 2018-01-26 MED ORDER — METHYLPREDNISOLONE ACETATE 40 MG/ML IJ SUSP
40.0000 mg | Freq: Once | INTRAMUSCULAR | Status: AC
  Administered 2018-01-26: 40 mg via INTRA_ARTICULAR

## 2018-01-26 NOTE — Progress Notes (Signed)
Debra Hardin - 57 y.o. female MRN [id number]  Date of birth: [birth year]   Chief complaint: Bilateral hip pain left worse than right  SUBJECTIVE:    History of present illness: Patient is a 57 year old Caucasian female who presents with a chief complaint of bilateral hip pain left worse than the right.  She states that approximately 6 weeks ago she was doing a wall sit in a store when all of a sudden she fell to the ground.  She impacted her gluteal/coccyx region.  She was able to get up afterwards however she noted some sharp pain on the left lateral hip as well as on the right lateral hip.  This happened approximately 6 weeks ago and has only improved slightly since then.  She has been seeing a chiropractor for 8 visits who manipulated her back using traction and countertraction and she has placed ice over the area with mild improvement of her symptoms.  Every day walking or getting up from a seated position exacerbates her symptoms.  She also states that she can no longer sleep on her left side due to the amount of pain that she is in at night.  It does wake her up in the middle of the night.  She has a history of trochanteric bursitis on the left side for which has received corticosteroid injections years ago.  She states this does feel like that.  She also does feel that she is weak.  Denies any low back pain or radicular symptoms.  No numbness or tingling.  No knee or foot pain.  She is not currently taking any medications for this.   Review of systems:  As stated above   Interval past medical history, surgical history, family history, and social history obtained pertinent changes include new onset atrial fibrillation..  She has started Eliquis and is scheduled for a cardioversion procedure in the next 2 to 3 weeks after properly anticoagulated.  OBJECTIVE:  Physical exam: Vital signs are reviewed. BP 104/78   Ht 5\' 6"  (1.676 m)   Wt 169 lb (76.7 kg)   BMI 27.28 kg/m   Gen.: Alert,  oriented, appears stated age, in no apparent distress HEENT: Moist oral mucosa Respiratory: Normal respirations, able to speak in full sentences Cardiac: Irregularly irregular radial pulse Integumentary: No rashes  Neurologic:  Sensation is intact to light touch L4-S1 Gait: normal without associated limp Psych: Normal affect, mood is described as good Musculoskeletal: No obvious abnormalities upon inspection of the left hip.  She does have significant tenderness to palpation over the posterior greater trochanter and in the muscle belly of the gluteus medius.  She has full range of motion in hip flexion extension abduction and adduction.  Strength testing is 4-5 in hip abduction, 5 out of 5 in hip flexion, 5 out of 5 in hip extension, 5 out of 5 in hip adduction.  Special tests: Positive Trendelenburg on the left.  Negative Ober's test.  Negative Corky Sox and Fadir testing.  Negative straight leg raise.  Neurovascularly intact.  Ultrasound: Greater trochanter was identified using a linear probe and using palpation for guidance on the left hip. Attachments of the gluteus medius on the posterior greater trochanter and gluteus minimum on the anterior greater trochanter were identified and intact without evidence of avulsion or injury. Muscle belly were examined in longitudinal and traverse planes wihtout evidence of abnormality or neovascularization. Mild-moderate soft tissue swelling noted over greater trochanter. No evidence of bursitis.    ASSESSMENT & PLAN:  1.  Greater trochanter pain syndrome 2.  Gluteus medius weakness   Plan: Given the patient's history and physical exam findings, I do believe that she is suffering from greater trochanter pain syndrome not due to a bursitis however due to a gluteus medius tendinopathy.  I do recommend a corticosteroid injection given the amount that this is bothering her at night and disrupting her sleep.  INJECTION: L Hip, glut medius tendon insertion on  posterior greater trochanter Patient was given informed consent, signed copy in the chart. Appropriate time out was taken. Area of maximal tenderness over greater trochanter was palpated and marked. Area prepped and draped in usual sterile fashion. 4 cc of Marcaine without epinephrine was used with 40mg  methylprednisolone to inject the greater trochanter. The patient tolerated the procedure well. There were no major complications. Post procedure instructions were given.  I also am recommending eccentric gluteus medius exercises including abduction, external rotation, lateral step up and crossovers to be performed per protocol.  I am recommending follow-up in the next 6 weeks.  She may use Tylenol as needed for breakthrough pain.  I am recommending against routine anti-inflammatory use as she is currently on Eliquis.  She may use ice tonight after the injection as she may be sore.  Patient is in agreement with this plan.  Clydene Laming, DO Sports Medicine Fellow [facility]  I observed and examined the patient with the [facility] Fellows and agree with assessment and plan.  Note reviewed and modified by me. Ila Mcgill, MD

## 2018-01-27 ENCOUNTER — Ambulatory Visit (HOSPITAL_COMMUNITY)
Admission: RE | Admit: 2018-01-27 | Discharge: 2018-01-27 | Disposition: A | Discharge status: Home / Self Care | Payer: Commercial Managed Care - PPO | Source: Ambulatory Visit | Attending: Nurse Practitioner | Admitting: Nurse Practitioner

## 2018-01-27 ENCOUNTER — Encounter (HOSPITAL_COMMUNITY): Payer: Self-pay

## 2018-01-27 DIAGNOSIS — I481 Persistent atrial fibrillation: Secondary | ICD-10-CM | POA: Insufficient documentation

## 2018-01-27 DIAGNOSIS — I4891 Unspecified atrial fibrillation: Secondary | ICD-10-CM | POA: Diagnosis present

## 2018-01-27 DIAGNOSIS — I253 Aneurysm of heart: Secondary | ICD-10-CM | POA: Insufficient documentation

## 2018-01-27 DIAGNOSIS — I517 Cardiomegaly: Secondary | ICD-10-CM | POA: Diagnosis not present

## 2018-01-27 DIAGNOSIS — E785 Hyperlipidemia, unspecified: Secondary | ICD-10-CM | POA: Diagnosis not present

## 2018-01-27 DIAGNOSIS — I4819 Other persistent atrial fibrillation: Secondary | ICD-10-CM

## 2018-01-27 NOTE — Progress Notes (Signed)
  Echocardiogram 2D Echocardiogram has been performed.  Debra Hardin 01/27/2018, 8:59 AM

## 2018-01-30 DIAGNOSIS — M62830 Muscle spasm of back: Secondary | ICD-10-CM | POA: Diagnosis not present

## 2018-01-30 DIAGNOSIS — M545 Low back pain: Secondary | ICD-10-CM | POA: Diagnosis not present

## 2018-01-30 DIAGNOSIS — M9903 Segmental and somatic dysfunction of lumbar region: Secondary | ICD-10-CM | POA: Diagnosis not present

## 2018-02-01 ENCOUNTER — Encounter (HOSPITAL_COMMUNITY): Payer: Self-pay | Admitting: Nurse Practitioner

## 2018-02-01 ENCOUNTER — Ambulatory Visit (HOSPITAL_COMMUNITY)
Admission: RE | Admit: 2018-02-01 | Discharge: 2018-02-01 | Disposition: A | Discharge status: Home / Self Care | Payer: Commercial Managed Care - PPO | Source: Ambulatory Visit | Attending: Nurse Practitioner | Admitting: Nurse Practitioner

## 2018-02-01 VITALS — BP 90/68 | HR 93 | Ht 66.0 in | Wt 168.0 lb

## 2018-02-01 DIAGNOSIS — Z7901 Long term (current) use of anticoagulants: Secondary | ICD-10-CM | POA: Insufficient documentation

## 2018-02-01 DIAGNOSIS — Z818 Family history of other mental and behavioral disorders: Secondary | ICD-10-CM | POA: Insufficient documentation

## 2018-02-01 DIAGNOSIS — Z90710 Acquired absence of both cervix and uterus: Secondary | ICD-10-CM | POA: Insufficient documentation

## 2018-02-01 DIAGNOSIS — I4891 Unspecified atrial fibrillation: Secondary | ICD-10-CM | POA: Insufficient documentation

## 2018-02-01 DIAGNOSIS — Z8249 Family history of ischemic heart disease and other diseases of the circulatory system: Secondary | ICD-10-CM | POA: Diagnosis not present

## 2018-02-01 DIAGNOSIS — M62830 Muscle spasm of back: Secondary | ICD-10-CM | POA: Diagnosis not present

## 2018-02-01 DIAGNOSIS — Z79899 Other long term (current) drug therapy: Secondary | ICD-10-CM | POA: Insufficient documentation

## 2018-02-01 DIAGNOSIS — I4819 Other persistent atrial fibrillation: Secondary | ICD-10-CM

## 2018-02-01 DIAGNOSIS — G4700 Insomnia, unspecified: Secondary | ICD-10-CM | POA: Insufficient documentation

## 2018-02-01 DIAGNOSIS — F329 Major depressive disorder, single episode, unspecified: Secondary | ICD-10-CM | POA: Diagnosis not present

## 2018-02-01 DIAGNOSIS — Z885 Allergy status to narcotic agent status: Secondary | ICD-10-CM | POA: Diagnosis not present

## 2018-02-01 DIAGNOSIS — Z811 Family history of alcohol abuse and dependence: Secondary | ICD-10-CM | POA: Diagnosis not present

## 2018-02-01 DIAGNOSIS — F419 Anxiety disorder, unspecified: Secondary | ICD-10-CM | POA: Diagnosis not present

## 2018-02-01 DIAGNOSIS — I481 Persistent atrial fibrillation: Secondary | ICD-10-CM | POA: Diagnosis not present

## 2018-02-01 DIAGNOSIS — G4733 Obstructive sleep apnea (adult) (pediatric): Secondary | ICD-10-CM | POA: Insufficient documentation

## 2018-02-01 DIAGNOSIS — J45909 Unspecified asthma, uncomplicated: Secondary | ICD-10-CM | POA: Diagnosis not present

## 2018-02-01 DIAGNOSIS — M9903 Segmental and somatic dysfunction of lumbar region: Secondary | ICD-10-CM | POA: Diagnosis not present

## 2018-02-01 DIAGNOSIS — E042 Nontoxic multinodular goiter: Secondary | ICD-10-CM | POA: Insufficient documentation

## 2018-02-01 DIAGNOSIS — E785 Hyperlipidemia, unspecified: Secondary | ICD-10-CM | POA: Insufficient documentation

## 2018-02-01 DIAGNOSIS — M545 Low back pain: Secondary | ICD-10-CM | POA: Diagnosis not present

## 2018-02-01 NOTE — H&P (View-Only) (Signed)
Primary Care Physician: [name], MD Referring Physician: Same   Debra Hardin is a 57 y.o. female with a h/o multiple thyroid nodules, mild OSA in the past, not using cpap, that is the afib clinic for evaluation after being found to have new onset afib at the time of colonoscopy last week. Pt did go on to  have the procedure without event. She continues in afib today, rate controlled. She reports that she has noted fluttering in her chest for around one year. She does notice palpitations, fatigue, no unusual shortness of breath. Is drinking some alcohol, minimal caffeine now, no tobacco,. H/o of mildly sleep apnea by sleep study 8 years ago, never really used CPAP. Admits to a 10 lb weight gain over the last 6-9 months. She has a chadsvasc score of 1. 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  F/u afib clinic, 7/31. She remains for the most part in rate controlled afib, without rate control meds. BP has been running on the lower side so that would make use of rate control difficult.. She has been on anticoagulation for almost one week now , no issues with bleeding. Discussed the plan is still to purse cardioversion  after 3 weeks of uninterrupted anticoagulation. Echo reviewed with pt and does show aneurysm of the atrial septal wall without PFO noted. I had reviewed this with Dr. Aundra Dubin and he said no w/u was needed.Would only be significant if pt had had a stroke.   Today, she denies symptoms of palpitations, chest pain, shortness of breath, orthopnea, PND, lower extremity edema, dizziness, presyncope, syncope, or neurologic sequela. The patient is tolerating medications without difficulties and is otherwise without complaint today.   Past Medical History:  Diagnosis Date  . Anxiety    Dr. [name]  . Asthma   . Bronchitis   . Depression   . HSV-2 (herpes simplex virus 2) infection   . Hyperlipidemia   .  Insomnia   . Lateral epicondylitis right; 09/2014   Dr. [name]  . Multiple thyroid nodules 03/2014   more prominent on L>R; h/o benign bx. f/u 1 yr  . OSA (obstructive sleep apnea)    CPAP (uses sporadically)  . Trochanteric bursitis 01/2015  . Vaginal dryness    Past Surgical History:  Procedure Laterality Date  . COMBINED HYSTERECTOMY VAGINAL / OOPHORECTOMY / A&P REPAIR  2008   pt states she has her ovaries  . CRYOTHERAPY    . DILATION AND CURETTAGE OF UTERUS    . HERNIA REPAIR  age 89   [facility]  . HYSTEROTOMY    . KNEE SURGERY Right    twice--Dr. Onnie Graham, Dr. Lynann Bologna (25 years ago)    Current Outpatient Medications  Medication Sig Dispense Refill  . albuterol (PROVENTIL HFA;VENTOLIN HFA) 108 (90 BASE) MCG/ACT inhaler Inhale 2 puffs into the lungs every 6 (six) hours as needed for wheezing or shortness of breath. 1 Inhaler 1  . apixaban (ELIQUIS) 5 MG TABS tablet Take 1 tablet (5 mg total) by mouth 2 (two) times daily. 60 tablet 0  . cetirizine (ZYRTEC) 10 MG tablet Take 10 mg by mouth daily.    Marland Kitchen estradiol (VIVELLE-DOT) 0.05 MG/24HR patch PLACE ONTO SKIN AND CHANGE TWICE A WEEK 8 patch 4  . montelukast (SINGULAIR) 10 MG tablet Take 1 tablet (10 mg total) by mouth at bedtime. 90 tablet 1  . traZODone (DESYREL) 100 MG tablet  Take 100 mg by mouth at bedtime.    . valACYclovir (VALTREX) 500 MG tablet Take one daily 90 tablet 1   No current facility-administered medications for this encounter.     Allergies  Allergen Reactions  . Codeine     Social History   Socioeconomic History  . Marital status: Married    Spouse name: Not on file  . Number of children: Not on file  . Years of education: Not on file  . Highest education level: Not on file  Occupational History  . Not on file  Social Needs  . Financial resource strain: Not on file  . Food insecurity:    Worry: Not on file    Inability: Not on file  . Transportation needs:    Medical: Not on file    Non-medical:  Not on file  Tobacco Use  . Smoking status: Never Smoker  . Smokeless tobacco: Never Used  Substance and Sexual Activity  . Alcohol use: Yes    Alcohol/week: 0.0 oz    Comment: occasional (up to 4/week, not every week)  . Drug use: No  . Sexual activity: Yes    Birth control/protection: Surgical    Comment: hysterectomy   Lifestyle  . Physical activity:    Days per week: Not on file    Minutes per session: Not on file  . Stress: Not on file  Relationships  . Social connections:    Talks on phone: Not on file    Gets together: Not on file    Attends religious service: Not on file    Active member of club or organization: Not on file    Attends meetings of clubs or organizations: Not on file    Relationship status: Not on file  . Intimate partner violence:    Fear of current or ex partner: Not on file    Emotionally abused: Not on file    Physically abused: Not on file    Forced sexual activity: Not on file  Other Topics Concern  . Not on file  Social History Narrative   Married.  2 daughters (one of whom has 2 rats).  Oldest is in college.   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 Bone cancer Paternal Grandmother   . Stroke Maternal Grandmother   . Heart attack Maternal Grandfather   . Stomach cancer Maternal Grandfather   . Heart attack Father 42       heart attacks at 43 and 31  . Alcohol abuse Father   . Hyperlipidemia Father   . Heart disease Mother   . Depression Mother   . Colon cancer Maternal Uncle   . Depression Sister   . Alcohol abuse Brother   . Hyperlipidemia Brother   . Anxiety disorder Brother   . Mental illness Daughter   . Alcohol abuse Sister   . Hyperlipidemia Sister   . Depression Sister   . Anxiety disorder Sister   . Diabetes Maternal Uncle     ROS- All systems are reviewed and negative except as per the HPI above  Physical Exam: Vitals:   02/01/18 0915  BP: 90/68  Pulse: 93    Weight: 168 lb (76.2 kg)  Height: 5\' 6"  (1.676 m)   Wt Readings from Last 3 Encounters:  02/01/18 168 lb (76.2 kg)  01/26/18 169 lb (76.7 kg)  01/24/18 173 lb 9.6 oz (78.7 kg)  Labs: Lab Results  Component Value Date   NA 137 10/30/2014   K 4.5 10/30/2014   CL 100 10/30/2014   CO2 28 10/30/2014   GLUCOSE 91 10/30/2014   BUN 13 10/30/2014   CREATININE 0.86 10/30/2014   CALCIUM 9.3 10/30/2014   No results found for: INR Lab Results  Component Value Date   CHOL 168 10/30/2014   HDL 68 10/30/2014   LDLCALC 90 10/30/2014   TRIG 50 10/30/2014     GEN- The patient is well appearing, alert and oriented x 3 today.   Head- normocephalic, atraumatic Eyes-  Sclera clear, conjunctiva pink Ears- hearing intact Oropharynx- clear Neck- supple, no JVP Lymph- no cervical lymphadenopathy Lungs- Clear to ausculation bilaterally, normal work of breathing Heart- irregular rate and rhythm, no murmurs, rubs or gallops, PMI not laterally displaced GI- soft, NT, ND, + BS Extremities- no clubbing, cyanosis, or edema MS- no significant deformity or atrophy Skin- no rash or lesion Psych- euthymic mood, full affect Neuro- strength and sensation are intact  EKG-afib at 93 bpm, qrs int 80 ms, qtc 442 ms Labs reviewed form physical in June CBC no anemia, plts 209, TSH 1.87, Creatinine 0.89 Kardia strips reviewed, most of which show rate controlled afib Echo-Study Conclusions  - Procedure narrative: Transthoracic echocardiography. Image   quality was adequate. The study was technically difficult. - Left ventricle: The cavity size was normal. Systolic function was   normal. The estimated ejection fraction was in the range of 60%   to 65%. Wall motion was normal; there were no regional wall   motion abnormalities. The study is not technically sufficient to   allow evaluation of LV diastolic function. - Right atrium: The atrium was mildly dilated. - Atrial septum: Atrial septal aneurysm  with marked bowing of the   mid and superior aspect of the septum to the right. No obvious   PFO   Bubble study not done.  Assessment and Plan: 1. New onset afib General education re afib  She is currently rate controlled, will hold off with low dose BB at this point. especially since BP is low today Echo reviewed with pt Encouraged to repeat sleep study 2/2 high correlation between untreated sleep apnea and afib Decrease alcohol intake to no more thatn 2 drinks a week Limit caffeine, stimulants   2. CHA2DS2VASc score of 1 For now will have to assume she is persistent and will need cardioversion to restore SR Conintue eliquis 5 mg bid, will cardiovert after 3 weeks uninterrupted drug use, date scheduled for 8/20 Bleeding precautions discussed I do not think she will need long term after she satisfies 4 weeks required following cardioversion and afib is quiet  F/u in one week after cardioversion Will refer to general cardiology in 2-3 months  Butch Penny C. Joal Eakle, [facility] [address] [place], [place] [address] [phone]

## 2018-02-01 NOTE — Progress Notes (Signed)
Primary Care Physician: [name], MD Referring Physician: Same   Debra Hardin is a 57 y.o. female with a h/o multiple thyroid nodules, mild OSA in the past, not using cpap, that is the afib clinic for evaluation after being found to have new onset afib at the time of colonoscopy last week. Pt did go on to  have the procedure without event. She continues in afib today, rate controlled. She reports that she has noted fluttering in her chest for around one year. She does notice palpitations, fatigue, no unusual shortness of breath. Is drinking some alcohol, minimal caffeine now, no tobacco,. H/o of mildly sleep apnea by sleep study 8 years ago, never really used CPAP. Admits to a 10 lb weight gain over the last 6-9 months. She has a chadsvasc score of 1. 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  F/u afib clinic, 7/31. She remains for the most part in rate controlled afib, without rate control meds. BP has been running on the lower side so that would make use of rate control difficult.. She has been on anticoagulation for almost one week now , no issues with bleeding. Discussed the plan is still to purse cardioversion  after 3 weeks of uninterrupted anticoagulation. Echo reviewed with pt and does show aneurysm of the atrial septal wall without PFO noted. I had reviewed this with Dr. Aundra Dubin and he said no w/u was needed.Would only be significant if pt had had a stroke.   Today, she denies symptoms of palpitations, chest pain, shortness of breath, orthopnea, PND, lower extremity edema, dizziness, presyncope, syncope, or neurologic sequela. The patient is tolerating medications without difficulties and is otherwise without complaint today.   Past Medical History:  Diagnosis Date  . Anxiety    Dr. [name]  . Asthma   . Bronchitis   . Depression   . HSV-2 (herpes simplex virus 2) infection   . Hyperlipidemia   .  Insomnia   . Lateral epicondylitis right; 09/2014   Dr. [name]  . Multiple thyroid nodules 03/2014   more prominent on L>R; h/o benign bx. f/u 1 yr  . OSA (obstructive sleep apnea)    CPAP (uses sporadically)  . Trochanteric bursitis 01/2015  . Vaginal dryness    Past Surgical History:  Procedure Laterality Date  . COMBINED HYSTERECTOMY VAGINAL / OOPHORECTOMY / A&P REPAIR  2008   pt states she has her ovaries  . CRYOTHERAPY    . DILATION AND CURETTAGE OF UTERUS    . HERNIA REPAIR  age 56   [facility]  . HYSTEROTOMY    . KNEE SURGERY Right    twice--Dr. Onnie Graham, Dr. Lynann Bologna (25 years ago)    Current Outpatient Medications  Medication Sig Dispense Refill  . albuterol (PROVENTIL HFA;VENTOLIN HFA) 108 (90 BASE) MCG/ACT inhaler Inhale 2 puffs into the lungs every 6 (six) hours as needed for wheezing or shortness of breath. 1 Inhaler 1  . apixaban (ELIQUIS) 5 MG TABS tablet Take 1 tablet (5 mg total) by mouth 2 (two) times daily. 60 tablet 0  . cetirizine (ZYRTEC) 10 MG tablet Take 10 mg by mouth daily.    Marland Kitchen estradiol (VIVELLE-DOT) 0.05 MG/24HR patch PLACE ONTO SKIN AND CHANGE TWICE A WEEK 8 patch 4  . montelukast (SINGULAIR) 10 MG tablet Take 1 tablet (10 mg total) by mouth at bedtime. 90 tablet 1  . traZODone (DESYREL) 100 MG tablet  Take 100 mg by mouth at bedtime.    . valACYclovir (VALTREX) 500 MG tablet Take one daily 90 tablet 1   No current facility-administered medications for this encounter.     Allergies  Allergen Reactions  . Codeine     Social History   Socioeconomic History  . Marital status: Married    Spouse name: Not on file  . Number of children: Not on file  . Years of education: Not on file  . Highest education level: Not on file  Occupational History  . Not on file  Social Needs  . Financial resource strain: Not on file  . Food insecurity:    Worry: Not on file    Inability: Not on file  . Transportation needs:    Medical: Not on file    Non-medical:  Not on file  Tobacco Use  . Smoking status: Never Smoker  . Smokeless tobacco: Never Used  Substance and Sexual Activity  . Alcohol use: Yes    Alcohol/week: 0.0 oz    Comment: occasional (up to 4/week, not every week)  . Drug use: No  . Sexual activity: Yes    Birth control/protection: Surgical    Comment: hysterectomy   Lifestyle  . Physical activity:    Days per week: Not on file    Minutes per session: Not on file  . Stress: Not on file  Relationships  . Social connections:    Talks on phone: Not on file    Gets together: Not on file    Attends religious service: Not on file    Active member of club or organization: Not on file    Attends meetings of clubs or organizations: Not on file    Relationship status: Not on file  . Intimate partner violence:    Fear of current or ex partner: Not on file    Emotionally abused: Not on file    Physically abused: Not on file    Forced sexual activity: Not on file  Other Topics Concern  . Not on file  Social History Narrative   Married.  2 daughters (one of whom has 2 rats).  Oldest is in college.   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 Bone cancer Paternal Grandmother   . Stroke Maternal Grandmother   . Heart attack Maternal Grandfather   . Stomach cancer Maternal Grandfather   . Heart attack Father 42       heart attacks at 70 and 58  . Alcohol abuse Father   . Hyperlipidemia Father   . Heart disease Mother   . Depression Mother   . Colon cancer Maternal Uncle   . Depression Sister   . Alcohol abuse Brother   . Hyperlipidemia Brother   . Anxiety disorder Brother   . Mental illness Daughter   . Alcohol abuse Sister   . Hyperlipidemia Sister   . Depression Sister   . Anxiety disorder Sister   . Diabetes Maternal Uncle     ROS- All systems are reviewed and negative except as per the HPI above  Physical Exam: Vitals:   02/01/18 0915  BP: 90/68  Pulse: 93    Weight: 168 lb (76.2 kg)  Height: 5\' 6"  (1.676 m)   Wt Readings from Last 3 Encounters:  02/01/18 168 lb (76.2 kg)  01/26/18 169 lb (76.7 kg)  01/24/18 173 lb 9.6 oz (78.7 kg)  Labs: Lab Results  Component Value Date   NA 137 10/30/2014   K 4.5 10/30/2014   CL 100 10/30/2014   CO2 28 10/30/2014   GLUCOSE 91 10/30/2014   BUN 13 10/30/2014   CREATININE 0.86 10/30/2014   CALCIUM 9.3 10/30/2014   No results found for: INR Lab Results  Component Value Date   CHOL 168 10/30/2014   HDL 68 10/30/2014   LDLCALC 90 10/30/2014   TRIG 50 10/30/2014     GEN- The patient is well appearing, alert and oriented x 3 today.   Head- normocephalic, atraumatic Eyes-  Sclera clear, conjunctiva pink Ears- hearing intact Oropharynx- clear Neck- supple, no JVP Lymph- no cervical lymphadenopathy Lungs- Clear to ausculation bilaterally, normal work of breathing Heart- irregular rate and rhythm, no murmurs, rubs or gallops, PMI not laterally displaced GI- soft, NT, ND, + BS Extremities- no clubbing, cyanosis, or edema MS- no significant deformity or atrophy Skin- no rash or lesion Psych- euthymic mood, full affect Neuro- strength and sensation are intact  EKG-afib at 93 bpm, qrs int 80 ms, qtc 442 ms Labs reviewed form physical in June CBC no anemia, plts 209, TSH 1.87, Creatinine 0.89 Kardia strips reviewed, most of which show rate controlled afib Echo-Study Conclusions  - Procedure narrative: Transthoracic echocardiography. Image   quality was adequate. The study was technically difficult. - Left ventricle: The cavity size was normal. Systolic function was   normal. The estimated ejection fraction was in the range of 60%   to 65%. Wall motion was normal; there were no regional wall   motion abnormalities. The study is not technically sufficient to   allow evaluation of LV diastolic function. - Right atrium: The atrium was mildly dilated. - Atrial septum: Atrial septal aneurysm  with marked bowing of the   mid and superior aspect of the septum to the right. No obvious   PFO   Bubble study not done.  Assessment and Plan: 1. New onset afib General education re afib  She is currently rate controlled, will hold off with low dose BB at this point. especially since BP is low today Echo reviewed with pt Encouraged to repeat sleep study 2/2 high correlation between untreated sleep apnea and afib Decrease alcohol intake to no more thatn 2 drinks a week Limit caffeine, stimulants   2. CHA2DS2VASc score of 1 For now will have to assume she is persistent and will need cardioversion to restore SR Conintue eliquis 5 mg bid, will cardiovert after 3 weeks uninterrupted drug use, date scheduled for 8/20 Bleeding precautions discussed I do not think she will need long term after she satisfies 4 weeks required following cardioversion and afib is quiet  F/u in one week after cardioversion Will refer to general cardiology in 2-3 months  Butch Penny C. Loreena Valeri, [facility] [address] [place], [place] [address] [phone]

## 2018-02-01 NOTE — Patient Instructions (Addendum)
Your cardioversion is scheduled for : Tuesday August 20th 2019 at 12 noon Arrive at the [facility] and go to admitting at 10:30 a.m. Do Not eat or drink anything after midnight the night prior to your procedure. Take all your medications with a sip of water prior to arrival. Do NOT miss any doses of your blood thinner. You will NOT be able to drive home after your procedure.    You will need to be at the afib clinic that morning for your blood work.  This has been scheduled for 10 am the morning of your procedure  You will be seen back in the clinic 1 week after your cardioversion.  If the date scheduled will not work for you, please call to reschedule.

## 2018-02-01 NOTE — Addendum Note (Signed)
Encounter addended by: Iona Hansen, CMA on: 02/01/2018 11:22 AM  Actions taken: Diagnosis association updated, Order list changed

## 2018-02-02 ENCOUNTER — Other Ambulatory Visit (HOSPITAL_COMMUNITY): Payer: Self-pay | Admitting: *Deleted

## 2018-02-02 DIAGNOSIS — M545 Low back pain: Secondary | ICD-10-CM | POA: Diagnosis not present

## 2018-02-02 DIAGNOSIS — M62830 Muscle spasm of back: Secondary | ICD-10-CM | POA: Diagnosis not present

## 2018-02-02 DIAGNOSIS — M9903 Segmental and somatic dysfunction of lumbar region: Secondary | ICD-10-CM | POA: Diagnosis not present

## 2018-02-09 ENCOUNTER — Ambulatory Visit (INDEPENDENT_AMBULATORY_CARE_PROVIDER_SITE_OTHER): Payer: Commercial Managed Care - PPO | Admitting: Sports Medicine

## 2018-02-09 ENCOUNTER — Encounter

## 2018-02-09 ENCOUNTER — Encounter: Payer: Self-pay | Admitting: Sports Medicine

## 2018-02-09 DIAGNOSIS — M25579 Pain in unspecified ankle and joints of unspecified foot: Secondary | ICD-10-CM

## 2018-02-09 DIAGNOSIS — R269 Unspecified abnormalities of gait and mobility: Secondary | ICD-10-CM | POA: Diagnosis not present

## 2018-02-09 NOTE — Assessment & Plan Note (Signed)
Most pain now located at MTP 1 LT > RT  Will modify support to float the MTP 1 joint

## 2018-02-09 NOTE — Progress Notes (Signed)
   Subjective:    Patient ID: Debra Hardin, female    DOB: 03-13-1961, 57 y.o.   MRN: [id number]  HPI  57 yo female here for follow up regarding bilateral foot pain. Last seen for this complaint 12/02/15. Had new custom orthotics made at that time. Orthotics helped initially but now having more frequent forefoot pain in both feet (L>R). Pain worse with prolonged standing or activity, especially raising up on her toes. Pain across entire forefoot but worse under 1st MTP. Occasionally, she experiences some left ankle pain. Denies any joint swelling or redness. Denies any numbness or tingling. Denies any new change in activity.  Soc Hx: Animal nutritionist  Review of Systems per HPI First toe pain but no redness or warmth No swelling of joint in feet or ankles     Objective:   Physical Exam BP 110/72   Ht 5\' 6"  (1.676 m)   Wt 168 lb (76.2 kg)   BMI 27.12 kg/m   General: well-appearing, NAD MSK:  Right foot: TMT bossing noted. Mild hallux valgus deformity. No swelling or ecchymosis. Preserved transverse arch, loss of longitudinal arch. No tenderness along metatarsals or MTPs. Strength 5/5 throughout. Neurovascularly intact.   Left foot: TMT bossing. Mild hallux valgus with hallux rigidus. Loss of transverse and longitudinal arches. No tenderness along metatarsals or MTPs. Strength 5/5 throughout. Neurovascularly intact.       Assessment & Plan:   Bilateral foot pain Patient with chronic bilateral foot pain likely 2/2 underlying first metatarsal insufficiency. Custom orthotics today with L pad (had previously tried first metatarsal  post). Follow up as needed.  Janace Hoard, PGY-3  I observed and examined the patient with the resident and agree with assessment and plan.  Note reviewed and modified by me. Stefanie Libel, MD

## 2018-02-09 NOTE — Assessment & Plan Note (Signed)
With pronated gait and medial column pushoff pattern we will make new orthotics with modified support for first MTP joint  Patient was fitted for a : standard, cushioned, semi-rigid orthotic. The orthotic was heated and afterward the patient stood on the orthotic blank positioned on the orthotic stand. The patient was positioned in subtalar neutral position and 10 degrees of ankle dorsiflexion in a weight bearing stance. After completion of molding, a stable base was applied to the orthotic blank. The blank was ground to a stable position for weight bearing. Size: 8 red EVA Base: Blue medium EVA Posting: L PADS under MTP bilat Additional orthotic padding: Left MT pad  Post completion she felt good support and less foot pain  I spent 35 minutes with this patient. Over 50% of visit was spend in counseling and coordination of care for problems with foot pain and pronated gait.

## 2018-02-13 DIAGNOSIS — M9903 Segmental and somatic dysfunction of lumbar region: Secondary | ICD-10-CM | POA: Diagnosis not present

## 2018-02-13 DIAGNOSIS — M545 Low back pain: Secondary | ICD-10-CM | POA: Diagnosis not present

## 2018-02-13 DIAGNOSIS — M62830 Muscle spasm of back: Secondary | ICD-10-CM | POA: Diagnosis not present

## 2018-02-20 ENCOUNTER — Other Ambulatory Visit (HOSPITAL_COMMUNITY): Payer: Self-pay | Admitting: Nurse Practitioner

## 2018-02-20 ENCOUNTER — Other Ambulatory Visit (HOSPITAL_COMMUNITY): Payer: Self-pay | Admitting: *Deleted

## 2018-02-20 MED ORDER — APIXABAN 5 MG PO TABS: 5.0000 mg | ORAL_TABLET | Freq: Two times a day (BID) | ORAL | 0 refills | Status: DC

## 2018-02-21 ENCOUNTER — Encounter (HOSPITAL_COMMUNITY): Payer: Self-pay | Admitting: Certified Registered Nurse Anesthetist

## 2018-02-21 ENCOUNTER — Ambulatory Visit (HOSPITAL_COMMUNITY)
Admission: RE | Admit: 2018-02-21 | Discharge: 2018-02-21 | Disposition: A | Discharge status: Home / Self Care | Payer: Commercial Managed Care - PPO | Source: Ambulatory Visit | Attending: Cardiology | Admitting: Cardiology

## 2018-02-21 ENCOUNTER — Ambulatory Visit (HOSPITAL_COMMUNITY): Payer: Commercial Managed Care - PPO | Admitting: Certified Registered Nurse Anesthetist

## 2018-02-21 ENCOUNTER — Ambulatory Visit (HOSPITAL_COMMUNITY)
Admission: RE | Admit: 2018-02-21 | Discharge: 2018-02-21 | Disposition: A | Discharge status: Home / Self Care | Payer: Commercial Managed Care - PPO | Source: Ambulatory Visit | Attending: Nurse Practitioner | Admitting: Nurse Practitioner

## 2018-02-21 ENCOUNTER — Other Ambulatory Visit: Payer: Self-pay

## 2018-02-21 ENCOUNTER — Encounter (HOSPITAL_COMMUNITY)
Admission: RE | Disposition: A | Discharge status: Home / Self Care | Payer: Self-pay | Source: Ambulatory Visit | Attending: Cardiology

## 2018-02-21 DIAGNOSIS — E785 Hyperlipidemia, unspecified: Secondary | ICD-10-CM | POA: Insufficient documentation

## 2018-02-21 DIAGNOSIS — G4733 Obstructive sleep apnea (adult) (pediatric): Secondary | ICD-10-CM | POA: Diagnosis not present

## 2018-02-21 DIAGNOSIS — Z8249 Family history of ischemic heart disease and other diseases of the circulatory system: Secondary | ICD-10-CM | POA: Insufficient documentation

## 2018-02-21 DIAGNOSIS — Z79899 Other long term (current) drug therapy: Secondary | ICD-10-CM | POA: Insufficient documentation

## 2018-02-21 DIAGNOSIS — J45909 Unspecified asthma, uncomplicated: Secondary | ICD-10-CM | POA: Insufficient documentation

## 2018-02-21 DIAGNOSIS — Z9889 Other specified postprocedural states: Secondary | ICD-10-CM | POA: Diagnosis not present

## 2018-02-21 DIAGNOSIS — M7711 Lateral epicondylitis, right elbow: Secondary | ICD-10-CM | POA: Insufficient documentation

## 2018-02-21 DIAGNOSIS — E042 Nontoxic multinodular goiter: Secondary | ICD-10-CM | POA: Diagnosis not present

## 2018-02-21 DIAGNOSIS — I481 Persistent atrial fibrillation: Secondary | ICD-10-CM | POA: Diagnosis not present

## 2018-02-21 DIAGNOSIS — Z7902 Long term (current) use of antithrombotics/antiplatelets: Secondary | ICD-10-CM | POA: Insufficient documentation

## 2018-02-21 DIAGNOSIS — Z823 Family history of stroke: Secondary | ICD-10-CM | POA: Insufficient documentation

## 2018-02-21 DIAGNOSIS — I4891 Unspecified atrial fibrillation: Secondary | ICD-10-CM | POA: Insufficient documentation

## 2018-02-21 DIAGNOSIS — Z90710 Acquired absence of both cervix and uterus: Secondary | ICD-10-CM | POA: Diagnosis not present

## 2018-02-21 DIAGNOSIS — Z8619 Personal history of other infectious and parasitic diseases: Secondary | ICD-10-CM | POA: Insufficient documentation

## 2018-02-21 DIAGNOSIS — Z885 Allergy status to narcotic agent status: Secondary | ICD-10-CM | POA: Diagnosis not present

## 2018-02-21 DIAGNOSIS — I480 Paroxysmal atrial fibrillation: Secondary | ICD-10-CM

## 2018-02-21 HISTORY — PX: CARDIOVERSION: SHX1299

## 2018-02-21 LAB — CBC WITH DIFFERENTIAL/PLATELET
Abs Immature Granulocytes: 0 10*3/uL (ref 0.0–0.1)
Basophils Absolute: 0 10*3/uL (ref 0.0–0.1)
Basophils Relative: 0 %
Eosinophils Absolute: 0.1 10*3/uL (ref 0.0–0.7)
Eosinophils Relative: 3 %
HEMATOCRIT: 40.4 % (ref 36.0–46.0)
Hemoglobin: 13.5 g/dL (ref 12.0–15.0)
IMMATURE GRANULOCYTES: 0 %
LYMPHS ABS: 1.3 10*3/uL (ref 0.7–4.0)
Lymphocytes Relative: 47 %
MCH: 30.5 pg (ref 26.0–34.0)
MCHC: 33.4 g/dL (ref 30.0–36.0)
MCV: 91.4 fL (ref 78.0–100.0)
MONOS PCT: 9 %
Monocytes Absolute: 0.2 10*3/uL (ref 0.1–1.0)
NEUTROS PCT: 41 %
Neutro Abs: 1.2 10*3/uL — ABNORMAL LOW (ref 1.7–7.7)
Platelets: 194 10*3/uL (ref 150–400)
RBC: 4.42 MIL/uL (ref 3.87–5.11)
RDW: 11.6 % (ref 11.5–15.5)
WBC: 2.8 10*3/uL — ABNORMAL LOW (ref 4.0–10.5)

## 2018-02-21 LAB — BASIC METABOLIC PANEL
Anion gap: 5 (ref 5–15)
BUN: 14 mg/dL (ref 6–20)
CHLORIDE: 106 mmol/L (ref 98–111)
CO2: 28 mmol/L (ref 22–32)
Calcium: 8.9 mg/dL (ref 8.9–10.3)
Creatinine, Ser: 0.96 mg/dL (ref 0.44–1.00)
GFR calc non Af Amer: >60 mL/min (ref >60)
GLUCOSE: 91 mg/dL (ref 70–99)
Potassium: 4.5 mmol/L (ref 3.5–5.1)
Sodium: 139 mmol/L (ref 135–145)

## 2018-02-21 SURGERY — CARDIOVERSION
Anesthesia: General

## 2018-02-21 MED ORDER — PROPOFOL 10 MG/ML IV BOLUS
INTRAVENOUS | Status: DC | PRN
  Administered 2018-02-21: 100 mg via INTRAVENOUS

## 2018-02-21 MED ORDER — LIDOCAINE 2% (20 MG/ML) 5 ML SYRINGE
INTRAMUSCULAR | Status: DC | PRN
  Administered 2018-02-21: 80 mg via INTRAVENOUS

## 2018-02-21 MED ORDER — SODIUM CHLORIDE 0.9 % IV SOLN
INTRAVENOUS | Status: DC | PRN
  Administered 2018-02-21: 11:00:00 via INTRAVENOUS

## 2018-02-21 MED ORDER — SODIUM CHLORIDE 0.9 % IV SOLN
INTRAVENOUS | Status: DC
  Administered 2018-02-21: 500 mL via INTRAVENOUS

## 2018-02-21 NOTE — Anesthesia Procedure Notes (Signed)
Procedure Name: General with mask airway Date/Time: 02/21/2018 11:20 AM Performed by: Harden Mo, CRNA Pre-anesthesia Checklist: Patient identified, Emergency Drugs available, Suction available, Patient being monitored and Timeout performed Patient Re-evaluated:Patient Re-evaluated prior to induction Oxygen Delivery Method: Ambu bag Preoxygenation: Pre-oxygenation with 100% oxygen Induction Type: IV induction Ventilation: Mask ventilation without difficulty Placement Confirmation: positive ETCO2 and breath sounds checked- equal and bilateral Dental Injury: Teeth and Oropharynx as per pre-operative assessment

## 2018-02-21 NOTE — Transfer of Care (Signed)
Immediate Anesthesia Transfer of Care Note  Patient: Debra Hardin  Procedure(s) Performed: CARDIOVERSION (N/A )  Patient Location: Endoscopy Unit  Anesthesia Type:General  Level of Consciousness: awake and alert   Airway & Oxygen Therapy: Patient Spontanous Breathing  Post-op Assessment: Report given to RN, Post -op Vital signs reviewed and stable and Patient moving all extremities X 4  Post vital signs: Reviewed and stable  Last Vitals:  Vitals Value Taken Time  BP    Temp    Pulse    Resp    SpO2      Last Pain:  Vitals:   02/21/18 1043  TempSrc: Oral  PainSc: 0-No pain         Complications: No apparent anesthesia complications

## 2018-02-21 NOTE — Discharge Instructions (Signed)
Electrical Cardioversion, Care After This sheet gives you information about how to care for yourself after your procedure. Your health care provider may also give you more specific instructions. If you have problems or questions, contact your health care provider. What can I expect after the procedure? After the procedure, it is common to have:  Some redness on the skin where the shocks were given. If seen,we recommend hydrocortisone cream.  Follow these instructions at home:  Do not drive for 24 hours if you were given a medicine to help you relax (sedative).  Take over-the-counter and prescription medicines only as told by your health care provider.  Ask your health care provider how to check your pulse. Check it often.  Rest for 48 hours after the procedure or as told by your health care provider.  Avoid or limit your caffeine use as told by your health care provider. Contact a health care provider if:  You feel like your heart is beating too quickly or your pulse is not regular.  You have a serious muscle cramp that does not go away. Get help right away if:  You have discomfort in your chest.  You are dizzy or you feel faint.  You have trouble breathing or you are short of breath.  Your speech is slurred.  You have trouble moving an arm or leg on one side of your body.  Your fingers or toes turn cold or blue. This information is not intended to replace advice given to you by your health care provider. Make sure you discuss any questions you have with your health care provider. Document Released: 04/11/2013 Document Revised: 01/23/2016 Document Reviewed: 12/26/2015 Elsevier Interactive Patient Education  [employer].

## 2018-02-21 NOTE — Anesthesia Postprocedure Evaluation (Signed)
Anesthesia Post Note  Patient: Debra Hardin  Procedure(s) Performed: CARDIOVERSION (N/A )     Patient location during evaluation: PACU Anesthesia Type: General Level of consciousness: sedated Pain management: pain level controlled Vital Signs Assessment: post-procedure vital signs reviewed and stable Respiratory status: spontaneous breathing and respiratory function stable Cardiovascular status: stable Postop Assessment: no apparent nausea or vomiting Anesthetic complications: no    Last Vitals:  Vitals:   02/21/18 1143 02/21/18 1153  BP: 98/67 107/76  Pulse: 85 73  Resp: (!) 21 20  Temp:    SpO2: 97% 98%    Last Pain:  Vitals:   02/21/18 1153  TempSrc:   PainSc: 0-No pain                 Salle Brandle DANIEL

## 2018-02-21 NOTE — CV Procedure (Signed)
   Electrical Cardioversion Procedure Note Johan Antonacci [id number] [birth year]  Procedure: Electrical Cardioversion Indications:  Atrial Fibrillation  Time Out: Verified patient identification, verified procedure,medications/allergies/relevent history reviewed, required imaging and test results available.  Performed  Procedure Details  The patient was NPO after midnight. Anesthesia was administered at the beside  by Dr.Singer with 100mg  of propofol and 80mg  of Lidocaine.  Cardioversion was done with synchronized biphasic defibrillation with AP pads with 150watts.  The patient failed to convert to NSR.   Cardioversion was done with synchronized biphasic defibrillation with AP pads with 200watts. The patient failed to convert to NSR.  Cardioversion was repeated with synchronized biphasic defibrillation with AP pads with 200watts. The patient failed to convert to NSR. The patient tolerated the procedure well   IMPRESSION:  Unsuccessful cardioversion of atrial fibrillation    Traci Turner 02/21/2018, 10:34 AM

## 2018-02-21 NOTE — Interval H&P Note (Signed)
History and Physical Interval Note:  02/21/2018 10:33 AM  Debra Hardin  has presented today for surgery, with the diagnosis of afib  The various methods of treatment have been discussed with the patient and family. After consideration of risks, benefits and other options for treatment, the patient has consented to  Procedure(s): CARDIOVERSION (N/A) as a surgical intervention .  The patient's history has been reviewed, patient examined, no change in status, stable for surgery.  I have reviewed the patient's chart and labs.  Questions were answered to the patient's satisfaction.     Fransico Him

## 2018-02-21 NOTE — Anesthesia Preprocedure Evaluation (Addendum)
Anesthesia Evaluation  Patient identified by MRN, date of birth, ID band Patient awake    Reviewed: Allergy & Precautions, NPO status , Patient's Chart, lab work & pertinent test results  History of Anesthesia Complications Negative for: history of anesthetic complications  Airway Mallampati: II  TM Distance: >3 FB Neck ROM: Full    Dental no notable dental hx. (+) Dental Advisory Given   Pulmonary asthma , sleep apnea and Continuous Positive Airway Pressure Ventilation ,    Pulmonary exam normal        Cardiovascular + dysrhythmias Atrial Fibrillation  Rhythm:Irregular  Study Conclusions  - Procedure narrative: Transthoracic echocardiography. Image   quality was adequate. The study was technically difficult. - Left ventricle: The cavity size was normal. Systolic function was   normal. The estimated ejection fraction was in the range of 60%   to 65%. Wall motion was normal; there were no regional wall   motion abnormalities. The study is not technically sufficient to   allow evaluation of LV diastolic function. - Right atrium: The atrium was mildly dilated. - Atrial septum: Atrial septal aneurysm with marked bowing of the   mid and superior aspect of the septum to the right. No obvious   PFO   Bubble study not done.   Neuro/Psych PSYCHIATRIC DISORDERS Anxiety Depression negative psych ROS   GI/Hepatic negative GI ROS, Neg liver ROS,   Endo/Other  negative endocrine ROS  Renal/GU negative Renal ROS  negative genitourinary   Musculoskeletal negative musculoskeletal ROS (+)   Abdominal   Peds negative pediatric ROS (+)  Hematology negative hematology ROS (+)   Anesthesia Other Findings   Reproductive/Obstetrics negative OB ROS                            Anesthesia Physical Anesthesia Plan  ASA: III  Anesthesia Plan: General   Post-op Pain Management:    Induction:  Intravenous  PONV Risk Score and Plan: 3 and Treatment may vary due to age or medical condition and Propofol infusion  Airway Management Planned: Mask  Additional Equipment:   Intra-op Plan:   Post-operative Plan:   Informed Consent:   Plan Discussed with:   Anesthesia Plan Comments:         Anesthesia Quick Evaluation

## 2018-02-27 DIAGNOSIS — M62830 Muscle spasm of back: Secondary | ICD-10-CM | POA: Diagnosis not present

## 2018-02-27 DIAGNOSIS — M9903 Segmental and somatic dysfunction of lumbar region: Secondary | ICD-10-CM | POA: Diagnosis not present

## 2018-02-27 DIAGNOSIS — M545 Low back pain: Secondary | ICD-10-CM | POA: Diagnosis not present

## 2018-02-28 ENCOUNTER — Ambulatory Visit (HOSPITAL_COMMUNITY)
Admission: RE | Admit: 2018-02-28 | Discharge: 2018-02-28 | Disposition: A | Discharge status: Home / Self Care | Payer: Commercial Managed Care - PPO | Source: Ambulatory Visit | Attending: Nurse Practitioner | Admitting: Nurse Practitioner

## 2018-02-28 VITALS — BP 112/68 | HR 82 | Ht 66.0 in | Wt 169.0 lb

## 2018-02-28 DIAGNOSIS — F419 Anxiety disorder, unspecified: Secondary | ICD-10-CM | POA: Insufficient documentation

## 2018-02-28 DIAGNOSIS — J45909 Unspecified asthma, uncomplicated: Secondary | ICD-10-CM | POA: Insufficient documentation

## 2018-02-28 DIAGNOSIS — Z818 Family history of other mental and behavioral disorders: Secondary | ICD-10-CM | POA: Diagnosis not present

## 2018-02-28 DIAGNOSIS — Z885 Allergy status to narcotic agent status: Secondary | ICD-10-CM | POA: Diagnosis not present

## 2018-02-28 DIAGNOSIS — Z79899 Other long term (current) drug therapy: Secondary | ICD-10-CM | POA: Insufficient documentation

## 2018-02-28 DIAGNOSIS — F329 Major depressive disorder, single episode, unspecified: Secondary | ICD-10-CM | POA: Diagnosis not present

## 2018-02-28 DIAGNOSIS — I4891 Unspecified atrial fibrillation: Secondary | ICD-10-CM | POA: Diagnosis present

## 2018-02-28 DIAGNOSIS — E042 Nontoxic multinodular goiter: Secondary | ICD-10-CM | POA: Diagnosis not present

## 2018-02-28 DIAGNOSIS — Z90710 Acquired absence of both cervix and uterus: Secondary | ICD-10-CM | POA: Insufficient documentation

## 2018-02-28 DIAGNOSIS — E785 Hyperlipidemia, unspecified: Secondary | ICD-10-CM | POA: Diagnosis not present

## 2018-02-28 DIAGNOSIS — Z833 Family history of diabetes mellitus: Secondary | ICD-10-CM | POA: Diagnosis not present

## 2018-02-28 DIAGNOSIS — Z8249 Family history of ischemic heart disease and other diseases of the circulatory system: Secondary | ICD-10-CM | POA: Insufficient documentation

## 2018-02-28 DIAGNOSIS — G4733 Obstructive sleep apnea (adult) (pediatric): Secondary | ICD-10-CM | POA: Insufficient documentation

## 2018-02-28 DIAGNOSIS — I4819 Other persistent atrial fibrillation: Secondary | ICD-10-CM

## 2018-02-28 DIAGNOSIS — Z823 Family history of stroke: Secondary | ICD-10-CM | POA: Insufficient documentation

## 2018-02-28 DIAGNOSIS — I481 Persistent atrial fibrillation: Secondary | ICD-10-CM | POA: Diagnosis not present

## 2018-02-28 DIAGNOSIS — Z7901 Long term (current) use of anticoagulants: Secondary | ICD-10-CM | POA: Insufficient documentation

## 2018-02-28 DIAGNOSIS — G4700 Insomnia, unspecified: Secondary | ICD-10-CM | POA: Insufficient documentation

## 2018-02-28 MED ORDER — DRONEDARONE HCL 400 MG PO TABS: 400.0000 mg | ORAL_TABLET | Freq: Two times a day (BID) | ORAL | 3 refills | Status: DC

## 2018-02-28 NOTE — Progress Notes (Signed)
Primary Care Physician: [name], MD Referring Physician: Same   Debra Hardin is a 57 y.o. female with a h/o multiple thyroid nodules, mild OSA in the past, not using cpap, that is the afib clinic for evaluation after being found to have new onset afib at the time of colonoscopy last week. Pt did go on to  have the procedure without event. She continues in afib today, rate controlled. She reports that she has noted fluttering in her chest for around one year. She does notice palpitations, fatigue, no unusual shortness of breath. Is drinking some alcohol, minimal caffeine now, no tobacco,. H/o of mildly sleep apnea by sleep study 8 years ago, never really used CPAP. Admits to a 10 lb weight gain over the last 6-9 months. She has a chadsvasc score of 1. 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  F/u afib clinic, 02/01/18. She remains for the most part in rate controlled afib, without rate control meds. BP has been running on the lower side so that would make use of rate control difficult.. She has been on anticoagulation for almost one week now , no issues with bleeding. Discussed the plan is still to purse cardioversion  after 3 weeks of uninterrupted anticoagulation. Echo reviewed with pt and does show aneurysm of the atrial septal wall without PFO noted. I had reviewed this with Dr. Aundra Dubin and he said no w/u was needed. Would only be significant if pt had had a stroke.   F/u in afib clinic, f/u cardioversion that was not successful despite being shocked x 3 times. Discussed options to restore SR and pt would prefer to try Multaq. No missed doses of eliquis. She had a cmet at MD's office recently with normal liver enzymes.  Today, she denies symptoms of palpitations, chest pain, shortness of breath, orthopnea, PND, lower extremity edema, dizziness, presyncope, syncope, or neurologic sequela. The patient is tolerating  medications without difficulties and is otherwise without complaint today.   Past Medical History:  Diagnosis Date  . Anxiety    Dr. [name]  . Asthma   . Bronchitis   . Depression   . HSV-2 (herpes simplex virus 2) infection   . Hyperlipidemia   . Insomnia   . Lateral epicondylitis right; 09/2014   Dr. [name]  . Multiple thyroid nodules 03/2014   more prominent on L>R; h/o benign bx. f/u 1 yr  . OSA (obstructive sleep apnea)    CPAP (uses sporadically)  . Trochanteric bursitis 01/2015  . Vaginal dryness    Past Surgical History:  Procedure Laterality Date  . CARDIOVERSION N/A 02/21/2018   Procedure: CARDIOVERSION;  Surgeon: Sueanne Margarita, MD;  Location: [facility] ENDOSCOPY;  Service: Cardiovascular;  Laterality: N/A;  . [place] / OOPHORECTOMY / A&P REPAIR  2008   pt states she has her ovaries  . CRYOTHERAPY    . DILATION AND CURETTAGE OF UTERUS    . HERNIA REPAIR  age 28   [facility]  . HYSTEROTOMY    . KNEE SURGERY Right    twice--Dr. Onnie Graham, Dr. Lynann Bologna (25 years ago)    Current Outpatient Medications  Medication Sig Dispense Refill  . albuterol (PROVENTIL HFA;VENTOLIN HFA) 108 (90 BASE) MCG/ACT inhaler Inhale 2 puffs into the lungs every 6 (six) hours as needed for wheezing or shortness of breath. 1 Inhaler 1  . apixaban (ELIQUIS) 5 MG TABS tablet Take 1 tablet (  5 mg total) by mouth 2 (two) times daily. 180 tablet 0  . cetirizine (ZYRTEC) 10 MG tablet Take 10 mg by mouth at bedtime.     Marland Kitchen estradiol (VIVELLE-DOT) 0.05 MG/24HR patch PLACE ONTO SKIN AND CHANGE TWICE A WEEK 8 patch 4  . Homeopathic Products (ARNICARE EX) Apply 1 application topically daily as needed (pain).    . montelukast (SINGULAIR) 10 MG tablet Take 1 tablet (10 mg total) by mouth at bedtime. 90 tablet 1  . traZODone (DESYREL) 100 MG tablet Take 100 mg by mouth at bedtime.    . valACYclovir (VALTREX) 500 MG tablet Take one daily (Patient taking differently: Take 500 mg by mouth daily. )  90 tablet 1   No current facility-administered medications for this encounter.     Allergies  Allergen Reactions  . Codeine Nausea And Vomiting    Social History   Socioeconomic History  . Marital status: Married    Spouse name: Not on file  . Number of children: Not on file  . Years of education: Not on file  . Highest education level: Not on file  Occupational History  . Not on file  Social Needs  . Financial resource strain: Not on file  . Food insecurity:    Worry: Not on file    Inability: Not on file  . Transportation needs:    Medical: Not on file    Non-medical: Not on file  Tobacco Use  . Smoking status: Never Smoker  . Smokeless tobacco: Never Used  Substance and Sexual Activity  . Alcohol use: Yes    Alcohol/week: 0.0 standard drinks    Comment: occasional (up to 4/week, not every week)  . Drug use: No  . Sexual activity: Yes    Birth control/protection: Surgical    Comment: hysterectomy   Lifestyle  . Physical activity:    Days per week: Not on file    Minutes per session: Not on file  . Stress: Not on file  Relationships  . Social connections:    Talks on phone: Not on file    Gets together: Not on file    Attends religious service: Not on file    Active member of club or organization: Not on file    Attends meetings of clubs or organizations: Not on file    Relationship status: Not on file  . Intimate partner violence:    Fear of current or ex partner: Not on file    Emotionally abused: Not on file    Physically abused: Not on file    Forced sexual activity: Not on file  Other Topics Concern  . Not on file  Social History Narrative   Married.  2 daughters (one of whom has 2 rats).  Oldest is in college.   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 Bone cancer Paternal Grandmother   . Stroke Maternal Grandmother   . Heart attack Maternal Grandfather   . Stomach cancer Maternal  Grandfather   . Heart attack Father 42       heart attacks at 64 and 29  . Alcohol abuse Father   . Hyperlipidemia Father   . Heart disease Mother   . Depression Mother   . Colon cancer Maternal Uncle   . Depression Sister   . Alcohol abuse Brother   . Hyperlipidemia Brother   . Anxiety disorder Brother   . Mental illness  Daughter   . Alcohol abuse Sister   . Hyperlipidemia Sister   . Depression Sister   . Anxiety disorder Sister   . Diabetes Maternal Uncle     ROS- All systems are reviewed and negative except as per the HPI above  Physical Exam: Vitals:   02/28/18 1546  BP: 112/68  Pulse: 82  Weight: 76.7 kg  Height: 5\' 6"  (1.676 m)   Wt Readings from Last 3 Encounters:  02/28/18 76.7 kg  02/21/18 75.8 kg  02/09/18 76.2 kg    Labs: Lab Results  Component Value Date   NA 139 02/21/2018   K 4.5 02/21/2018   CL 106 02/21/2018   CO2 28 02/21/2018   GLUCOSE 91 02/21/2018   BUN 14 02/21/2018   CREATININE 0.96 02/21/2018   CALCIUM 8.9 02/21/2018   No results found for: INR Lab Results  Component Value Date   CHOL 168 10/30/2014   HDL 68 10/30/2014   LDLCALC 90 10/30/2014   TRIG 50 10/30/2014     GEN- The patient is well appearing, alert and oriented x 3 today.   Head- normocephalic, atraumatic Eyes-  Sclera clear, conjunctiva pink Ears- hearing intact Oropharynx- clear Neck- supple, no JVP Lymph- no cervical lymphadenopathy Lungs- Clear to ausculation bilaterally, normal work of breathing Heart- irregular rate and rhythm, no murmurs, rubs or gallops, PMI not laterally displaced GI- soft, NT, ND, + BS Extremities- no clubbing, cyanosis, or edema MS- no significant deformity or atrophy Skin- no rash or lesion Psych- euthymic mood, full affect Neuro- strength and sensation are intact  EKG-afib at 93 bpm, qrs int 80 ms, qtc 442 ms Labs reviewed form physical in June CBC no anemia, plts 209, TSH 1.87, Creatinine 0.89 Kardia strips reviewed, most of  which show rate controlled afib Echo-Study Conclusions  - Procedure narrative: Transthoracic echocardiography. Image   quality was adequate. The study was technically difficult. - Left ventricle: The cavity size was normal. Systolic function was   normal. The estimated ejection fraction was in the range of 60%   to 65%. Wall motion was normal; there were no regional wall   motion abnormalities. The study is not technically sufficient to   allow evaluation of LV diastolic function. - Right atrium: The atrium was mildly dilated. - Atrial septum: Atrial septal aneurysm with marked bowing of the   mid and superior aspect of the septum to the right. No obvious   PFO   Bubble study not done.  Assessment and Plan: 1. New diagnosis of  Afib, unsure as to onset of afib since pt has felt palpitations x one year Unsuccessful cardioversion She is currently rate controlled, have held off  with low dose BB at this point, with soft  BP's and fairly low HR's  Discussed antiarrythmic options, multaq and flecainide were discussed in detail and pt would like to try Multaq.  Checked with PharmD as she is onTrazodone which is qtc prolonging, qtc today 450 ms. PharmD suggested to load with trazodone on board as pt uses every night and is afraid she may not be able to sleep without it, but suggested to have close f/u for qtc interval  Encouraged to repeat sleep study 2/2 high correlation between untreated sleep apnea and afib Decrease alcohol intake to no more thatn 2 drinks a week Limit caffeine, stimulants   2. CHA2DS2VASc score of 1 Conintue eliquis 5 mg bid,states no missed doses I do not think she will need long term after she satisfies 4  weeks required following cardioversion and afib is quiet  Plan to start multaq 400 mg bid with food  3 days prior to cardioversion, cardioversion is scheduled 8/4 She will have repeat ekg on multaq am of cardioversion with required labs Will need f/u cmet one month  on multaq   Milady Fleener C. Chisom Aust, [facility] [address] [place], [place] [address] [phone]

## 2018-02-28 NOTE — Patient Instructions (Signed)
Start multaq 400mg  twice a day on Sunday, September 1st  Cardioversion scheduled for Wednesday, September 4th  -come to afib clinic at [place] at the [facility] and go to admitting at 1130am  -Do not eat or drink anything after midnight the night prior to your procedure.  - Take all your medication with a sip of water prior to arrival.  - You will not be able to drive home after your procedure.

## 2018-03-02 DIAGNOSIS — M9903 Segmental and somatic dysfunction of lumbar region: Secondary | ICD-10-CM | POA: Diagnosis not present

## 2018-03-02 DIAGNOSIS — M62830 Muscle spasm of back: Secondary | ICD-10-CM | POA: Diagnosis not present

## 2018-03-02 DIAGNOSIS — M545 Low back pain: Secondary | ICD-10-CM | POA: Diagnosis not present

## 2018-03-08 ENCOUNTER — Ambulatory Visit (HOSPITAL_COMMUNITY): Payer: Commercial Managed Care - PPO | Admitting: Anesthesiology

## 2018-03-08 ENCOUNTER — Other Ambulatory Visit: Payer: Self-pay

## 2018-03-08 ENCOUNTER — Ambulatory Visit (HOSPITAL_COMMUNITY)
Admission: RE | Admit: 2018-03-08 | Discharge: 2018-03-08 | Disposition: A | Discharge status: Home / Self Care | Payer: Commercial Managed Care - PPO | Source: Ambulatory Visit | Attending: Cardiology | Admitting: Cardiology

## 2018-03-08 ENCOUNTER — Encounter (HOSPITAL_COMMUNITY): Payer: Self-pay | Admitting: *Deleted

## 2018-03-08 ENCOUNTER — Ambulatory Visit (HOSPITAL_COMMUNITY)
Admission: RE | Admit: 2018-03-08 | Discharge: 2018-03-08 | Disposition: A | Discharge status: Home / Self Care | Payer: Commercial Managed Care - PPO | Source: Ambulatory Visit | Attending: Nurse Practitioner | Admitting: Nurse Practitioner

## 2018-03-08 ENCOUNTER — Encounter (HOSPITAL_COMMUNITY)
Admission: RE | Disposition: A | Discharge status: Home / Self Care | Payer: Self-pay | Source: Ambulatory Visit | Attending: Cardiology

## 2018-03-08 DIAGNOSIS — F419 Anxiety disorder, unspecified: Secondary | ICD-10-CM | POA: Diagnosis not present

## 2018-03-08 DIAGNOSIS — G4730 Sleep apnea, unspecified: Secondary | ICD-10-CM | POA: Insufficient documentation

## 2018-03-08 DIAGNOSIS — F329 Major depressive disorder, single episode, unspecified: Secondary | ICD-10-CM | POA: Diagnosis not present

## 2018-03-08 DIAGNOSIS — J45909 Unspecified asthma, uncomplicated: Secondary | ICD-10-CM | POA: Insufficient documentation

## 2018-03-08 DIAGNOSIS — M1990 Unspecified osteoarthritis, unspecified site: Secondary | ICD-10-CM | POA: Diagnosis not present

## 2018-03-08 DIAGNOSIS — G4733 Obstructive sleep apnea (adult) (pediatric): Secondary | ICD-10-CM | POA: Diagnosis not present

## 2018-03-08 DIAGNOSIS — E785 Hyperlipidemia, unspecified: Secondary | ICD-10-CM | POA: Diagnosis not present

## 2018-03-08 DIAGNOSIS — I4891 Unspecified atrial fibrillation: Secondary | ICD-10-CM | POA: Diagnosis present

## 2018-03-08 HISTORY — PX: CARDIOVERSION: SHX1299

## 2018-03-08 SURGERY — CARDIOVERSION
Anesthesia: General

## 2018-03-08 MED ORDER — SODIUM CHLORIDE 0.9 % IV SOLN
INTRAVENOUS | Status: DC
  Administered 2018-03-08: 12:00:00 via INTRAVENOUS

## 2018-03-08 MED ORDER — LIDOCAINE HCL (CARDIAC) PF 100 MG/5ML IV SOSY
PREFILLED_SYRINGE | INTRAVENOUS | Status: DC | PRN
  Administered 2018-03-08: 60 mg via INTRAVENOUS

## 2018-03-08 MED ORDER — PROPOFOL 10 MG/ML IV BOLUS
INTRAVENOUS | Status: DC | PRN
  Administered 2018-03-08: 60 mg via INTRAVENOUS

## 2018-03-08 NOTE — Discharge Instructions (Signed)
Electrical Cardioversion, Care After °This sheet gives you information about how to care for yourself after your procedure. Your health care provider may also give you more specific instructions. If you have problems or questions, contact your health care provider. °What can I expect after the procedure? °After the procedure, it is common to have: °· Some redness on the skin where the shocks were given. ° °Follow these instructions at home: °· Do not drive for 24 hours if you were given a medicine to help you relax (sedative). °· Take over-the-counter and prescription medicines only as told by your health care provider. °· Ask your health care provider how to check your pulse. Check it often. °· Rest for 48 hours after the procedure or as told by your health care provider. °· Avoid or limit your caffeine use as told by your health care provider. °Contact a health care provider if: °· You feel like your heart is beating too quickly or your pulse is not regular. °· You have a serious muscle cramp that does not go away. °Get help right away if: °· You have discomfort in your chest. °· You are dizzy or you feel faint. °· You have trouble breathing or you are short of breath. °· Your speech is slurred. °· You have trouble moving an arm or leg on one side of your body. °· Your fingers or toes turn cold or blue. °This information is not intended to replace advice given to you by your health care provider. Make sure you discuss any questions you have with your health care provider. °Document Released: 04/11/2013 Document Revised: 01/23/2016 Document Reviewed: 12/26/2015 °Elsevier Interactive Patient Education © 2018 Elsevier Inc. ° °

## 2018-03-08 NOTE — Transfer of Care (Signed)
Immediate Anesthesia Transfer of Care Note  Patient: Debra Hardin  Procedure(s) Performed: CARDIOVERSION (N/A )  Patient Location: PACU  Anesthesia Type:General  Level of Consciousness: awake, alert , oriented and patient cooperative  Airway & Oxygen Therapy: Patient Spontanous Breathing and Patient connected to nasal cannula oxygen  Post-op Assessment: Report given to RN and Post -op Vital signs reviewed and stable  Post vital signs: Reviewed and stable  Last Vitals:  Vitals Value Taken Time  BP    Temp    Pulse    Resp    SpO2      Last Pain:  Vitals:   03/08/18 1147  TempSrc: Oral  PainSc: 0-No pain         Complications: No apparent anesthesia complications

## 2018-03-08 NOTE — Progress Notes (Addendum)
Pt in for pre-dccv EKG.  To be reviewed by Ceasar Lund  In for EKG, pt failed cardioversion before. Started on multaq 400 mg bid  3 days prior and is now for repeat cardioversion. She is tolerating multaq well. Ekg shows afib at 86 bpm, qrs int 76 ms, qtc 464 ms. To endo.

## 2018-03-08 NOTE — CV Procedure (Signed)
Procedure:   DCCV  Indication:  Symptomatic atrial fibrillation  Procedure Note:  The patient signed informed consent.  She has had had therapeutic anticoagulation with apixaban greater than 3 weeks.  Anesthesia was administered by Dr. Ola Spurr.  Adequate airway was maintained throughout and vitals followed per protocol.  She was cardioverted x 1 with 120J of biphasic synchronized energy.  She converted to NSR.  There were no apparent complications.  The patient had normal neuro status and respiratory status post procedure with vitals stable as recorded elsewhere.    Follow up:  She will continue on current medical therapy.  She already has follow up scheduled with the afib clinic.  Buford Dresser, MD PhD 03/08/2018 1:09 PM

## 2018-03-08 NOTE — Anesthesia Postprocedure Evaluation (Signed)
Anesthesia Post Note  Patient: Debra Hardin  Procedure(s) Performed: CARDIOVERSION (N/A )     Patient location during evaluation: Endoscopy Anesthesia Type: General Level of consciousness: awake and alert Pain management: pain level controlled Vital Signs Assessment: post-procedure vital signs reviewed and stable Respiratory status: spontaneous breathing, nonlabored ventilation and respiratory function stable Cardiovascular status: blood pressure returned to baseline and stable Postop Assessment: no apparent nausea or vomiting Anesthetic complications: no    Last Vitals:  Vitals:   03/08/18 1307 03/08/18 1315  BP:  116/66  Pulse: (!) 58 (!) 56  Resp: 13 17  Temp:    SpO2: 100% 99%    Last Pain:  Vitals:   03/08/18 1300  TempSrc: Oral  PainSc: 0-No pain                 [name],W. EDMOND

## 2018-03-08 NOTE — Anesthesia Preprocedure Evaluation (Signed)
Anesthesia Evaluation  Patient identified by MRN, date of birth, ID band Patient awake    Reviewed: Allergy & Precautions, H&P , NPO status , Patient's Chart, lab work & pertinent test results  Airway Mallampati: II  TM Distance: >3 FB Neck ROM: Full    Dental no notable dental hx. (+) Teeth Intact, Dental Advisory Given   Pulmonary asthma , sleep apnea ,    Pulmonary exam normal breath sounds clear to auscultation       Cardiovascular negative cardio ROS   Rhythm:Irregular Rate:Normal     Neuro/Psych Anxiety Depression negative neurological ROS     GI/Hepatic negative GI ROS, Neg liver ROS,   Endo/Other  negative endocrine ROS  Renal/GU negative Renal ROS  negative genitourinary   Musculoskeletal  (+) Arthritis ,   Abdominal   Peds  Hematology negative hematology ROS (+)   Anesthesia Other Findings   Reproductive/Obstetrics negative OB ROS                             Anesthesia Physical Anesthesia Plan  ASA: III  Anesthesia Plan: General   Post-op Pain Management:    Induction: Intravenous  PONV Risk Score and Plan: 3 and Treatment may vary due to age or medical condition  Airway Management Planned: Mask  Additional Equipment:   Intra-op Plan:   Post-operative Plan:   Informed Consent: I have reviewed the patients History and Physical, chart, labs and discussed the procedure including the risks, benefits and alternatives for the proposed anesthesia with the patient or authorized representative who has indicated his/her understanding and acceptance.   Dental advisory given  Plan Discussed with: CRNA  Anesthesia Plan Comments:         Anesthesia Quick Evaluation

## 2018-03-08 NOTE — H&P (Signed)
History and Physical Interval Note:    Debra Hardin is a 57 y.o. female has presented today for surgery, with the diagnosis of Atrial Fibrillation  The various methods of treatment have been discussed with the patient and family. After consideration of risks, benefits and other options for treatment, the patient has consented to  Procedure(s): CARDIOVERSION (N/A) as a surgical intervention .  The patient's history has been reviewed, patient examined, no change in status, stable for surgery.  I have reviewed the patient's chart and labs.  Questions were answered to the patient's satisfaction.     Buford Dresser, MD PhD 03/08/2018 11:56 AM

## 2018-03-13 ENCOUNTER — Ambulatory Visit (HOSPITAL_COMMUNITY)
Admission: RE | Admit: 2018-03-13 | Discharge: 2018-03-13 | Disposition: A | Discharge status: Home / Self Care | Payer: Commercial Managed Care - PPO | Source: Ambulatory Visit | Attending: Nurse Practitioner | Admitting: Nurse Practitioner

## 2018-03-13 ENCOUNTER — Encounter (HOSPITAL_COMMUNITY): Payer: Self-pay | Admitting: Nurse Practitioner

## 2018-03-13 VITALS — BP 98/66 | HR 75 | Ht 66.0 in | Wt 170.0 lb

## 2018-03-13 DIAGNOSIS — F419 Anxiety disorder, unspecified: Secondary | ICD-10-CM | POA: Insufficient documentation

## 2018-03-13 DIAGNOSIS — Z7901 Long term (current) use of anticoagulants: Secondary | ICD-10-CM | POA: Diagnosis not present

## 2018-03-13 DIAGNOSIS — G4700 Insomnia, unspecified: Secondary | ICD-10-CM | POA: Insufficient documentation

## 2018-03-13 DIAGNOSIS — Z811 Family history of alcohol abuse and dependence: Secondary | ICD-10-CM | POA: Insufficient documentation

## 2018-03-13 DIAGNOSIS — Z8249 Family history of ischemic heart disease and other diseases of the circulatory system: Secondary | ICD-10-CM | POA: Diagnosis not present

## 2018-03-13 DIAGNOSIS — E785 Hyperlipidemia, unspecified: Secondary | ICD-10-CM | POA: Insufficient documentation

## 2018-03-13 DIAGNOSIS — Z885 Allergy status to narcotic agent status: Secondary | ICD-10-CM | POA: Insufficient documentation

## 2018-03-13 DIAGNOSIS — Z79899 Other long term (current) drug therapy: Secondary | ICD-10-CM | POA: Diagnosis not present

## 2018-03-13 DIAGNOSIS — I481 Persistent atrial fibrillation: Secondary | ICD-10-CM | POA: Diagnosis not present

## 2018-03-13 DIAGNOSIS — I4819 Other persistent atrial fibrillation: Secondary | ICD-10-CM

## 2018-03-13 DIAGNOSIS — F329 Major depressive disorder, single episode, unspecified: Secondary | ICD-10-CM | POA: Diagnosis not present

## 2018-03-13 DIAGNOSIS — Z90710 Acquired absence of both cervix and uterus: Secondary | ICD-10-CM | POA: Insufficient documentation

## 2018-03-13 DIAGNOSIS — J45909 Unspecified asthma, uncomplicated: Secondary | ICD-10-CM | POA: Diagnosis not present

## 2018-03-13 DIAGNOSIS — Z818 Family history of other mental and behavioral disorders: Secondary | ICD-10-CM | POA: Insufficient documentation

## 2018-03-13 DIAGNOSIS — I4891 Unspecified atrial fibrillation: Secondary | ICD-10-CM | POA: Insufficient documentation

## 2018-03-13 DIAGNOSIS — G4733 Obstructive sleep apnea (adult) (pediatric): Secondary | ICD-10-CM | POA: Diagnosis not present

## 2018-03-13 NOTE — Progress Notes (Signed)
Primary Care Physician: [name], MD Referring Physician: Same   Debra Hardin is a 57 y.o. female with a h/o multiple thyroid nodules, mild OSA in the past, not using cpap, that is the afib clinic for evaluation after being found to have new onset afib at the time of colonoscopy last week. Pt did go on to  have the procedure without event. She continues in afib today, rate controlled. She reports that she has noted fluttering in her chest for around one year. She does notice palpitations, fatigue, no unusual shortness of breath. Is drinking some alcohol, minimal caffeine now, no tobacco,. H/o of mildly sleep apnea by sleep study 8 years ago, never really used CPAP. Admits to a 10 lb weight gain over the last 6-9 months. She has a chadsvasc score of 1. 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  F/u afib clinic, 02/01/18. She remains for the most part in rate controlled afib, without rate control meds. BP has been running on the lower side so that would make use of rate control difficult.. She has been on anticoagulation for almost one week now , no issues with bleeding. Discussed the plan is still to purse cardioversion  after 3 weeks of uninterrupted anticoagulation. Echo reviewed with pt and does show aneurysm of the atrial septal wall without PFO noted. I had reviewed this with Dr. Aundra Dubin and he said no w/u was needed. Would only be significant if pt had had a stroke.   F/u in afib clinic, f/u cardioversion that was not successful despite being shocked x 3 times. Discussed options to restore SR and pt would prefer to try Multaq. No missed doses of eliquis. She had a cmet at MD's office recently with normal liver enzymes.  F/u 9/9. Cardioversion with multaq on board was successful and she remains in SR today. She however, does not feel that improved. Still has some fatigue but is happy to be back in SR.  Today, she  denies symptoms of palpitations, chest pain, shortness of breath, orthopnea, PND, lower extremity edema, dizziness, presyncope, syncope, or neurologic sequela. The patient is tolerating medications without difficulties and is otherwise without complaint today.   Past Medical History:  Diagnosis Date  . Anxiety    Dr. [name]  . Asthma   . Bronchitis   . Depression   . HSV-2 (herpes simplex virus 2) infection   . Hyperlipidemia   . Insomnia   . Lateral epicondylitis right; 09/2014   Dr. [name]  . Multiple thyroid nodules 03/2014   more prominent on L>R; h/o benign bx. f/u 1 yr  . OSA (obstructive sleep apnea)    CPAP (uses sporadically)  . Trochanteric bursitis 01/2015  . Vaginal dryness    Past Surgical History:  Procedure Laterality Date  . CARDIOVERSION N/A 02/21/2018   Procedure: CARDIOVERSION;  Surgeon: Sueanne Margarita, MD;  Location: [facility] ENDOSCOPY;  Service: Cardiovascular;  Laterality: N/A;  . CARDIOVERSION N/A 03/08/2018   Procedure: CARDIOVERSION;  Surgeon: Buford Dresser, MD;  Location: [facility] ENDOSCOPY;  Service: Cardiovascular;  Laterality: N/A;  . [place] / OOPHORECTOMY / A&P REPAIR  2008   pt states she has her ovaries  . CRYOTHERAPY    . DILATION AND CURETTAGE OF UTERUS    . HERNIA REPAIR  age 9   LIH  . HYSTEROTOMY    . KNEE SURGERY Right    twice--Dr. Onnie Graham,  Dr. Lynann Bologna (25 years ago)    Current Outpatient Medications  Medication Sig Dispense Refill  . albuterol (PROVENTIL HFA;VENTOLIN HFA) 108 (90 BASE) MCG/ACT inhaler Inhale 2 puffs into the lungs every 6 (six) hours as needed for wheezing or shortness of breath. 1 Inhaler 1  . apixaban (ELIQUIS) 5 MG TABS tablet Take 1 tablet (5 mg total) by mouth 2 (two) times daily. 180 tablet 0  . cetirizine (ZYRTEC) 10 MG tablet Take 10 mg by mouth at bedtime.     . dronedarone (MULTAQ) 400 MG tablet Take 1 tablet (400 mg total) by mouth 2 (two) times daily with a meal. 60 tablet 3  .  estradiol (VIVELLE-DOT) 0.05 MG/24HR patch PLACE ONTO SKIN AND CHANGE TWICE A WEEK 8 patch 4  . Homeopathic Products (ARNICARE EX) Apply 1 application topically daily as needed (pain).    . montelukast (SINGULAIR) 10 MG tablet Take 1 tablet (10 mg total) by mouth at bedtime. 90 tablet 1  . traZODone (DESYREL) 100 MG tablet Take 100 mg by mouth at bedtime.    . valACYclovir (VALTREX) 500 MG tablet Take one daily (Patient taking differently: Take 500 mg by mouth daily. ) 90 tablet 1   No current facility-administered medications for this encounter.     Allergies  Allergen Reactions  . Codeine Nausea And Vomiting    Social History   Socioeconomic History  . Marital status: Married    Spouse name: Not on file  . Number of children: Not on file  . Years of education: Not on file  . Highest education level: Not on file  Occupational History  . Not on file  Social Needs  . Financial resource strain: Not on file  . Food insecurity:    Worry: Not on file    Inability: Not on file  . Transportation needs:    Medical: Not on file    Non-medical: Not on file  Tobacco Use  . Smoking status: Never Smoker  . Smokeless tobacco: Never Used  Substance and Sexual Activity  . Alcohol use: Yes    Alcohol/week: 0.0 standard drinks    Comment: occasional (up to 4/week, not every week)  . Drug use: No  . Sexual activity: Yes    Birth control/protection: Surgical    Comment: hysterectomy   Lifestyle  . Physical activity:    Days per week: Not on file    Minutes per session: Not on file  . Stress: Not on file  Relationships  . Social connections:    Talks on phone: Not on file    Gets together: Not on file    Attends religious service: Not on file    Active member of club or organization: Not on file    Attends meetings of clubs or organizations: Not on file    Relationship status: Not on file  . Intimate partner violence:    Fear of current or ex partner: Not on file    Emotionally  abused: Not on file    Physically abused: Not on file    Forced sexual activity: Not on file  Other Topics Concern  . Not on file  Social History Narrative   Married.  2 daughters (one of whom has 2 rats).  Oldest is in college.   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 Bone cancer Paternal Grandmother   . Stroke Maternal Grandmother   . Heart  attack Maternal Grandfather   . Stomach cancer Maternal Grandfather   . Heart attack Father 42       heart attacks at 33 and 59  . Alcohol abuse Father   . Hyperlipidemia Father   . Heart disease Mother   . Depression Mother   . Colon cancer Maternal Uncle   . Depression Sister   . Alcohol abuse Brother   . Hyperlipidemia Brother   . Anxiety disorder Brother   . Mental illness Daughter   . Alcohol abuse Sister   . Hyperlipidemia Sister   . Depression Sister   . Anxiety disorder Sister   . Diabetes Maternal Uncle     ROS- All systems are reviewed and negative except as per the HPI above  Physical Exam: Vitals:   03/13/18 0941  BP: 98/66  Pulse: 75  Weight: 77.1 kg  Height: 5\' 6"  (1.676 m)   Wt Readings from Last 3 Encounters:  03/13/18 77.1 kg  02/28/18 76.7 kg  02/21/18 75.8 kg    Labs: Lab Results  Component Value Date   NA 139 02/21/2018   K 4.5 02/21/2018   CL 106 02/21/2018   CO2 28 02/21/2018   GLUCOSE 91 02/21/2018   BUN 14 02/21/2018   CREATININE 0.96 02/21/2018   CALCIUM 8.9 02/21/2018   No results found for: INR Lab Results  Component Value Date   CHOL 168 10/30/2014   HDL 68 10/30/2014   LDLCALC 90 10/30/2014   TRIG 50 10/30/2014     GEN- The patient is well appearing, alert and oriented x 3 today.   Head- normocephalic, atraumatic Eyes-  Sclera clear, conjunctiva pink Ears- hearing intact Oropharynx- clear Neck- supple, no JVP Lymph- no cervical lymphadenopathy Lungs- Clear to ausculation bilaterally, normal work of breathing Heart-  irregular rate and rhythm, no murmurs, rubs or gallops, PMI not laterally displaced GI- soft, NT, ND, + BS Extremities- no clubbing, cyanosis, or edema MS- no significant deformity or atrophy Skin- no rash or lesion Psych- euthymic mood, full affect Neuro- strength and sensation are intact  EKG-afib at 93 bpm, qrs int 80 ms, qtc 442 ms Labs reviewed form physical in June CBC no anemia, plts 209, TSH 1.87, Creatinine 0.89 Kardia strips reviewed, most of which show rate controlled afib Echo-Study Conclusions  - Procedure narrative: Transthoracic echocardiography. Image   quality was adequate. The study was technically difficult. - Left ventricle: The cavity size was normal. Systolic function was   normal. The estimated ejection fraction was in the range of 60%   to 65%. Wall motion was normal; there were no regional wall   motion abnormalities. The study is not technically sufficient to   allow evaluation of LV diastolic function. - Right atrium: The atrium was mildly dilated. - Atrial septum: Atrial septal aneurysm with marked bowing of the   mid and superior aspect of the septum to the right. No obvious   PFO   Bubble study not done.  Assessment and Plan: 1. New diagnosis of  Afib, unsure as to onset of afib since pt has felt palpitations x one year Unsuccessful cardioversion, 8/20, but loaded on Multaq and then with successful cardioversion,9/4 and remains in SR today She will continue multaq 400 mg bid, not on rate control for slow v response in afib and soft BP's at times PharmD suggested to load with trazodone on board as pt uses every night and is afraid she may not be able to sleep without it, but suggested to  have close f/u for qtc interval, qtc stable at 448 ms Encouraged to repeat sleep study 2/2 high correlation between untreated sleep apnea and afib Decrease alcohol intake to no more thatn 2 drinks a week Limit caffeine, stimulants   2. CHA2DS2VASc score of 1 Conintue  eliquis 5 mg bid,states no missed doses  I will see back in 3 weeks which will be 4 weeks since cardioversion, and anticipate  that at that time, may be able to stop anticoagulation, per guidelines  Will need f/u cmet on f/u in 3 weeks on multaq   Donna C. Carroll, [facility] [address] [place], [place] [address] [phone]

## 2018-03-16 ENCOUNTER — Ambulatory Visit
Admission: RE | Admit: 2018-03-16 | Discharge: 2018-03-16 | Disposition: A | Discharge status: Home / Self Care | Payer: Commercial Managed Care - PPO | Source: Ambulatory Visit | Attending: Sports Medicine | Admitting: Sports Medicine

## 2018-03-16 ENCOUNTER — Encounter: Payer: Self-pay | Admitting: Sports Medicine

## 2018-03-16 ENCOUNTER — Ambulatory Visit (INDEPENDENT_AMBULATORY_CARE_PROVIDER_SITE_OTHER): Payer: Commercial Managed Care - PPO | Admitting: Sports Medicine

## 2018-03-16 VITALS — BP 102/78 | Ht 66.0 in | Wt 170.0 lb

## 2018-03-16 DIAGNOSIS — M25552 Pain in left hip: Secondary | ICD-10-CM

## 2018-03-16 NOTE — Assessment & Plan Note (Signed)
With limited response to injection New pain pattern in groin and at night  Will do MRI to r/o other pathology

## 2018-03-16 NOTE — Patient Instructions (Signed)
We are going to get a xray of the left hip. We will send you across the street today for that.  We will review those films and give you a call with results. If they are normal, we will follow up with a MRI for a possible labral tear.

## 2018-03-16 NOTE — Progress Notes (Signed)
Subjective:    CC: left hip pain  HPI:  Debra Hardin is a 57 year old female who presents today for follow-up regarding left hip pain that she has been experiencing since a fall in July 2019.   At her last visit, her exam and symptoms (lateral hip pain L>R) were most consistent with greater trochanter pain syndrome and a steroid injection was performed. She was given exercises and recommended Tylenol for pain control.  Today, the patient reports that the injection improved her lateral hip pain for a few weeks, but that pain has since returned. It is impacting her ability to sleep on the left side. She stared th recommended exercises but was experiencing a grinding or catching sensation in the lateral hip when she did anything involving external rotation of the hip, so stopped.   She also reports new anterior hip pain almost in the groin region. She describes the pain as "deep" and a dull ache. It is constant, and present even as she sits at her desk and as she sleeps. She is not taking anything for pain, as NSAIDs and Tylenol do not work for her. She applied ice and states that, while it helps for the lateral hip pain, it does not do anything for the new anterior hip pain. She denies radiation of the pain to the leg or anywhere. She has no numbness or tingling  Overall, she feels that the pain is significant and is starting to wear on her  ROS 10-point review of systems negative except as above  I reviewed the past medical history, family history, social history, surgical history, and allergies today and no changes were needed.  Please see the problem list section below in epic for further details.  Past Medical History: Past Medical History:  Diagnosis Date  . Anxiety    Dr. [name]  . Asthma   . Bronchitis   . Depression   . HSV-2 (herpes simplex virus 2) infection   . Hyperlipidemia   . Insomnia   . Lateral epicondylitis right; 09/2014   Dr. [name]  . Multiple thyroid  nodules 03/2014   more prominent on L>R; h/o benign bx. f/u 1 yr  . OSA (obstructive sleep apnea)    CPAP (uses sporadically)  . Trochanteric bursitis 01/2015  . Vaginal dryness    Past Surgical History: Past Surgical History:  Procedure Laterality Date  . CARDIOVERSION N/A 02/21/2018   Procedure: CARDIOVERSION;  Surgeon: Sueanne Margarita, MD;  Location[facility] END[facility]COPY;  Service: Cardiovascular;  Laterality: N/A;  . CARDIOVERSION N/A 03/08/2018   Procedure: CARDIOVERSION;  Surgeon: Buford Dresser, MD;  Location: [facility] ENDOSCOPY;  Service: Cardiovascular;  Laterality: N/A;  . [place] / OOPHORECTOMY / A&P REPAIR  2008   pt states she has her ovaries  . CRYOTHERAPY    . DILATION AND CURETTAGE OF UTERUS    . HERNIA REPAIR  age 89   LIH  . HYSTEROTOMY    . KNEE SURGERY Right    twice--Dr. Onnie Graham, Dr. Lynann Bologna (25 years ago)   Social History: Social History   Socioeconomic History  . Marital status: Married    Spouse name: Not on file  . Number of children: Not on file  . Years of education: Not on file  . Highest education level: Not on file  Occupational History  . Not on file  Social Needs  . Financial resource strain: Not on file  . Food insecurity:    Worry: Not on file  Inability: Not on file  . Transportation needs:    Medical: Not on file    Non-medical: Not on file  Tobacco Use  . Smoking status: Never Smoker  . Smokeless tobacco: Never Used  Substance and Sexual Activity  . Alcohol use: Yes    Alcohol/week: 0.0 standard drinks    Comment: occasional (up to 4/week, not every week)  . Drug use: No  . Sexual activity: Yes    Birth control/protection: Surgical    Comment: hysterectomy   Lifestyle  . Physical activity:    Days per week: Not on file    Minutes per session: Not on file  . Stress: Not on file  Relationships  . Social connections:    Talks on phone: Not on file    Gets together: Not on file    Attends religious service: Not  on file    Active member of club or organization: Not on file    Attends meetings of clubs or organizations: Not on file    Relationship status: Not on file  Other Topics Concern  . Not on file  Social History Narrative   Married.  2 daughters (one of whom has 2 rats).  Oldest is in college.   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 Bone cancer Paternal Grandmother   . Stroke Maternal Grandmother   . Heart attack Maternal Grandfather   . Stomach cancer Maternal Grandfather   . Heart attack Father 42       heart attacks at 4 and 40  . Alcohol abuse Father   . Hyperlipidemia Father   . Heart disease Mother   . Depression Mother   . Colon cancer Maternal Uncle   . Depression Sister   . Alcohol abuse Brother   . Hyperlipidemia Brother   . Anxiety disorder Brother   . Mental illness Daughter   . Alcohol abuse Sister   . Hyperlipidemia Sister   . Depression Sister   . Anxiety disorder Sister   . Diabetes Maternal Uncle    Allergies: Allergies  Allergen Reactions  . Codeine Nausea And Vomiting   Medications: See med rec.  Review of Systems: No fevers, chills, night sweats, weight loss, chest pain, or shortness of breath.   Objective:    General: Well Developed, well nourished, and in no acute distress.  MSK: Hip:  - Inspection: No gross deformity, no swelling, erythema, or ecchymosis - Palpation: TTP in lateral hip over greater trochanter and in anteromedial hip - ROM: Normal range of motion on Flexion, extension, abduction, internal and external rotation; pain with  - Strength: Normal strength. - Neuro/vasc: NV intact distally - Special Tests: Positive FABER and FADIR. Negative Ober's.    Impression and Recommendations:    Left Hip Pain - given change in exam today compared to prior visit especially the new anterior and almost groin pain, I am more concerned for a labral tear than greater  trochanter pain syndrome - Xray left hip to evaluate for labral tear - We will call patient with results - If normal, we will consider MRI for further evaluation for labral tear - Return visit timing pending films  Ancil Linsey, MD PGY-3 [facility]  Addendum:: hip XR does not reveal significant OA  Considering increase in groin pain and night time pain will go ahead with MRI  I observed and examined the patient with the resident  and agree with assessment and plan.  Note reviewed and modified by me. Ila Mcgill, MD

## 2018-03-17 NOTE — Addendum Note (Signed)
Addended by: Jolinda Croak E on: 03/17/2018 11:33 AM   Modules accepted: Orders

## 2018-03-20 DIAGNOSIS — G4733 Obstructive sleep apnea (adult) (pediatric): Secondary | ICD-10-CM | POA: Diagnosis not present

## 2018-03-25 ENCOUNTER — Ambulatory Visit
Admission: RE | Admit: 2018-03-25 | Discharge: 2018-03-25 | Disposition: A | Discharge status: Home / Self Care | Payer: Commercial Managed Care - PPO | Source: Ambulatory Visit | Attending: Sports Medicine | Admitting: Sports Medicine

## 2018-03-25 DIAGNOSIS — M25552 Pain in left hip: Secondary | ICD-10-CM | POA: Diagnosis not present

## 2018-03-27 ENCOUNTER — Other Ambulatory Visit (HOSPITAL_COMMUNITY): Payer: Self-pay | Admitting: *Deleted

## 2018-03-27 DIAGNOSIS — I4819 Other persistent atrial fibrillation: Secondary | ICD-10-CM

## 2018-03-30 ENCOUNTER — Telehealth: Payer: Self-pay | Admitting: Internal Medicine

## 2018-03-30 NOTE — Telephone Encounter (Signed)
New Message   Dr. [name] would like to speak with Dr. Rayann Heman and states she will wait until he comes in tomorrow 9/27

## 2018-03-31 NOTE — Telephone Encounter (Signed)
Dr. Rayann Heman to call

## 2018-04-04 ENCOUNTER — Ambulatory Visit (HOSPITAL_COMMUNITY): Payer: Commercial Managed Care - PPO | Admitting: Nurse Practitioner

## 2018-04-05 ENCOUNTER — Encounter: Payer: Self-pay | Admitting: Internal Medicine

## 2018-04-05 ENCOUNTER — Ambulatory Visit (INDEPENDENT_AMBULATORY_CARE_PROVIDER_SITE_OTHER): Payer: Commercial Managed Care - PPO | Admitting: Internal Medicine

## 2018-04-05 VITALS — BP 102/64 | HR 67 | Ht 66.0 in | Wt 168.6 lb

## 2018-04-05 DIAGNOSIS — I480 Paroxysmal atrial fibrillation: Secondary | ICD-10-CM | POA: Diagnosis not present

## 2018-04-05 DIAGNOSIS — R002 Palpitations: Secondary | ICD-10-CM | POA: Diagnosis not present

## 2018-04-05 DIAGNOSIS — Z23 Encounter for immunization: Secondary | ICD-10-CM | POA: Diagnosis not present

## 2018-04-05 NOTE — Progress Notes (Signed)
Electrophysiology Office Note   Date:  04/05/2018   ID:  Debra, Hardin 06-26-1961, MRN [id number]  PCP:  [name], MD    Primary Electrophysiologist: [name], MD    CC: afib   History of Present Illness: Debra Hardin is a 57 y.o. female who presents today for electrophysiology evaluation.   She presents on referral from Debra Hardin in the AF clinic and also Dr Debra Hardin (whome I have spoken with about the patient last week) for EP consultation regarding atrial fibrillation.  She was initially found to have afib 7/19 at time of routine colonoscopy.  She reports having "fluttering" in her chest for about a year.  She has OSA and was noncompliant with CPAP.  She was evaluated by AF clinic 01/24/18 and started on eliquis.  Echo was performed which revealed EF 60%, mild atrial enlargement, + atrial septal aneurysm with marked bowing of the spetum.  No obvious PFO.  She underwent cardioversion 02/21/18.  This was unsuccessful.  She was started on multaq and returned for cardioversion 03/08/18.  She remains in sinus rhythm since that time.  She continues to have fatigue.  She has poor energy and is "exhausted" at the end of the day.  She does not feel well rested  Upon taking.  She has a sleep study pending.  She attributes her symptoms of multaq.   Today, she denies symptoms of palpitations, chest pain, shortness of breath, orthopnea, PND, lower extremity edema, claudication, dizziness, presyncope, syncope, bleeding, or neurologic sequela. The patient is tolerating medications without difficulties and is otherwise without complaint today.    Past Medical History:  Diagnosis Date  . Anxiety    Dr. Sharalyn Hardin  . Asthma   . Bronchitis   . Depression   . HSV-2 (herpes simplex virus 2) infection   . Hyperlipidemia   . Insomnia   . Lateral epicondylitis right; 09/2014   Dr. Karlton Hardin  . Multiple thyroid nodules 03/2014   more prominent on L>R; h/o benign bx.  f/u 1 yr  . OSA (obstructive sleep apnea)    CPAP (uses sporadically)  . Trochanteric bursitis 01/2015  . Vaginal dryness    Past Surgical History:  Procedure Laterality Date  . CARDIOVERSION N/A 02/21/2018   Procedure: CARDIOVERSION;  Surgeon: Sueanne Margarita, MD;  Location: [facility] ENDOSCOPY;  Service: Cardiovascular;  Laterality: N/A;  . CARDIOVERSION N/A 03/08/2018   Procedure: CARDIOVERSION;  Surgeon: Buford Dresser, MD;  Location: [facility] ENDOSCOPY;  Service: Cardiovascular;  Laterality: N/A;  . [place] / OOPHORECTOMY / A&P REPAIR  2008   pt states she has her ovaries  . CRYOTHERAPY    . DILATION AND CURETTAGE OF UTERUS    . HERNIA REPAIR  age 37   LIH  . HYSTEROTOMY    . KNEE SURGERY Right    twice--Dr. Onnie Graham, Dr. Lynann Bologna (25 years ago)     Current Outpatient Medications  Medication Sig Dispense Refill  . albuterol (PROVENTIL HFA;VENTOLIN HFA) 108 (90 BASE) MCG/ACT inhaler Inhale 2 puffs into the lungs every 6 (six) hours as needed for wheezing or shortness of breath. 1 Inhaler 1  . apixaban (ELIQUIS) 5 MG TABS tablet Take 1 tablet (5 mg total) by mouth 2 (two) times daily. 180 tablet 0  . cetirizine (ZYRTEC) 10 MG tablet Take 10 mg by mouth at bedtime.     . dronedarone (MULTAQ) 400 MG tablet Take 1 tablet (400 mg total) by mouth 2 (two)  times daily with a meal. 60 tablet 3  . estradiol (VIVELLE-DOT) 0.05 MG/24HR patch PLACE ONTO SKIN AND CHANGE TWICE A WEEK 8 patch 4  . Homeopathic Products (ARNICARE EX) Apply 1 application topically daily as needed (pain).    . montelukast (SINGULAIR) 10 MG tablet Take 1 tablet (10 mg total) by mouth at bedtime. 90 tablet 1  . traZODone (DESYREL) 100 MG tablet Take 100 mg by mouth at bedtime.    . valACYclovir (VALTREX) 500 MG tablet Take one daily (Patient taking differently: Take 500 mg by mouth daily. ) 90 tablet 1   No current facility-administered medications for this visit.     Allergies:   Codeine   Social  History:  The patient  reports that she has never smoked. She has never used smokeless tobacco. She reports that she drinks alcohol. She reports that she does not use drugs.   Family History:  The patient's family history includes Alcohol abuse in her brother, father, and sister; Anxiety disorder in her brother and sister; Bone cancer in her paternal grandmother; Colon cancer in her maternal uncle; Depression in her mother, sister, and sister; Diabetes in her maternal uncle; Heart attack in her maternal grandfather; Heart attack (age of onset: 34) in her father; Heart disease in her mother; Hyperlipidemia in her brother, father, and sister; Mental illness in her daughter; Stomach cancer in her maternal grandfather; Stroke in her maternal grandmother.    ROS:  Please see the history of present illness.   All other systems are personally reviewed and negative.    PHYSICAL EXAM: VS:  BP 102/64   Pulse 67   Ht 5\' 6"  (1.676 m)   Wt 168 lb 9.6 oz (76.5 kg)   SpO2 98%   BMI 27.21 kg/m  , BMI Body mass index is 27.21 kg/m. GEN: Well nourished, well developed, in no acute distress  HEENT: normal  Neck: no JVD, carotid bruits, or masses Cardiac: RRR; no murmurs, rubs, or gallops,no edema  Respiratory:  clear to auscultation bilaterally, normal work of breathing GI: soft, nontender, nondistended, + BS MS: no deformity or atrophy  Skin: warm and dry  Neuro:  Strength and sensation are intact Psych: euthymic mood, full affect  EKG:  EKG is ordered today. The ekg ordered today is personally reviewed and shows sinus rhythm 67 bpm, PR 200 msec, RS 86 msec, QTc 450 msec, poor r wave progression   Recent Labs: 02/21/2018: BUN 14; Creatinine, Ser 0.96; Hemoglobin 13.5; Platelets 194; Potassium 4.5; Sodium 139  personally reviewed   Lipid Panel     Component Value Date/Time   CHOL 168 10/30/2014 0950   TRIG 50 10/30/2014 0950   HDL 68 10/30/2014 0950   CHOLHDL 2.5 10/30/2014 0950   VLDL 10  10/30/2014 0950   LDLCALC 90 10/30/2014 0950   personally reviewed   Wt Readings from Last 3 Encounters:  04/05/18 168 lb 9.6 oz (76.5 kg)  03/16/18 170 lb (77.1 kg)  03/13/18 170 lb (77.1 kg)      Other studies personally reviewed: Additional studies/ records that were reviewed today include: AF clinic notes, prior echo  Review of the above records today demonstrates: as above   ASSESSMENT AND PLAN:  1.  Persistent afib The patient has symptomatic, recurrent persistent atrial fibrillation. She is currently maintaining sinus rhythm with multaq.  Her qt is very stable.  I spoke with her PCP this past week.  There was concern about her being on trazodone.  As  her qt is very stable, I think that it is ok to continue this medicine. Chads2vasc score is 1.  she is anticoagulated with eliquis.  We may be able to stop anticoagulation at some point. I would advise that we continue our current strategy for now.  She has fatigue and sleepiness of unclear etiology.  I am not convinced that it is due to multaq.  I would like to see the results of her sleep study before making any changes. No changes are made today  She will return in 3 weeks for further discussion.  If she does not have treatable sleep apnea and does not improve with a more prolonged course of sinus, she may be more interested in ablation in order to come off of multaq which she thinks could be the cause for her symptoms.  2. OSA Sleep study planned.   Follow-up:   Follow-up with me in 3 weeks  Current medicines are reviewed at length with the patient today.   The patient does not have concerns regarding her medicines.  The following changes were made today:  none  Labs/ tests ordered today include:  Orders Placed This Encounter  Procedures  . Flu Vaccine QUAD 36+ mos IM     Signed, [name], MD  04/05/2018 4:43 PM     [place] [address] [phone]  (office) [phone] (fax)

## 2018-04-05 NOTE — Patient Instructions (Addendum)
Medication Instructions:  Your physician recommends that you continue on your current medications as directed. Please refer to the Current Medication list given to you today.  Labwork: None ordered.  Testing/Procedures: None ordered.  Follow-Up: Your physician wants you to follow-up in: 3 weeks with Dr. Rayann Heman.     April 26, 2018 at 3:15 pm   Any Other Special Instructions Will Be Listed Below (If Applicable).  If you need a refill on your cardiac medications before your next appointment, please call your pharmacy.

## 2018-04-10 ENCOUNTER — Telehealth: Payer: Self-pay | Admitting: Internal Medicine

## 2018-04-10 DIAGNOSIS — G4733 Obstructive sleep apnea (adult) (pediatric): Secondary | ICD-10-CM | POA: Diagnosis not present

## 2018-04-10 NOTE — Telephone Encounter (Signed)
Pt calling today with questions regarding her anticoagulation. Per Dr Jackalyn Lombard last OV note, she is to continue with her anticoagulation at this time. Pt states she has an upcoming appt with Dr Rayann Heman and may want to discuss ablation at that time. I advised her to speak with him about continued anticoagulation at that time as well. Pt has verbalized understanding and had no additional questions.

## 2018-04-10 NOTE — Telephone Encounter (Signed)
New Message:   Pt c/o medication issue:  1. Name of Medication: apixaban (ELIQUIS) 5 MG TABS tablet  2. How are you currently taking this medication (dosage and times per day)? Take 1 tablet (5 mg total) by mouth 2 (two) times daily.  3. Are you having a reaction (difficulty breathing--STAT)? No   4. What is your medication issue? Patient is questioning if she should continue taking the medication

## 2018-04-13 ENCOUNTER — Ambulatory Visit (INDEPENDENT_AMBULATORY_CARE_PROVIDER_SITE_OTHER): Payer: Commercial Managed Care - PPO | Admitting: Internal Medicine

## 2018-04-13 ENCOUNTER — Encounter: Payer: Self-pay | Admitting: Internal Medicine

## 2018-04-13 VITALS — BP 120/70 | HR 66 | Ht 66.0 in | Wt 168.0 lb

## 2018-04-13 DIAGNOSIS — E042 Nontoxic multinodular goiter: Secondary | ICD-10-CM

## 2018-04-13 NOTE — Patient Instructions (Signed)
We will schedule a new ultrasound for you and will be called to schedule this.  Please return to see me as needed.

## 2018-04-13 NOTE — Progress Notes (Addendum)
Patient ID: Debra Hardin, female   DOB: 17-Mar-1961, 57 y.o.   MRN: [id number]   HPI  Debra Hardin is a 57 y.o.-year-old female, initially referred by her PCP, Dr. [name], returning for f/u for MNG.  Last visit was 2 years and 3 months ago.  Since last visit, patient had multiple cardioversions for atrial fibrillation.  She was started on Eliquis and dronedarone.  Reviewed and addended history: She was diagnosed with multinodular goiter in 2011.  At that time, she was seen Dr. Elyse Hsu.  She had a left nodule Bx'ed then: Findings consistent with non-neoplastic goiter with cystic degeneration.  Thyroid U/S (03/12/2014): 4 nodules: 2 in the R and 2 in the L lobe.  Right thyroid lobe: 5.1 x 1.9 x 2.8 cm.  - Predominantly cystic and partially solid dominant nodule in the midportion of the right lobe measures 1.9 x 1.1 x 1.5 cm. This nodule is similar in  size to the prior study and shows further cystic degeneration compared to prior ultrasound.  - Complex, predominantly cystic nodule in the inferior right lobe measures approximately 1.1 x 1.0 x 1.1 cm.   Left thyroid lobe: 5.2 x 1.5 x 2.2 cm.  - Nodular area in the midportion of the left lobe measures approximately 3.0 x 1.3 x 1.5 cm. This may actually be a conglomeration of 2-3 nodular areas  merging into one another. (previously Bx'ed) - Nodular area in the inferior left lobe measures 1.6 x 1.1 x 1.2 cm.   Isthmus Thickness: 0.2 cm. No nodules visualized.   Lymphadenopathy: None visualized.   New thyroid U/S (12/29/2015):  Right thyroid lobe: 5.3 x 2.0 x 2.8 cm. Multiple nodules are again seen.  The dominant nodule lies in the midportion of the right lobe measuring 1.9 x 1.1 x 1.5 cm. It has a mixed solid and cystic  appearance and is stable from the prior exam consistent with a degenerating nodule.   An adjacent 1.2 cm predominately hyperechoic lesion is better visualized on the current exam.   A more inferior 1.1 cm nodule  is again noted and stable.  Left thyroid lobe: 5.0 x 1.6 x 2.2 cm.  A dominant 3.3 x 1.4 x 1.6 cm nodule is noted and stable in appearance.  A smaller lower pole 1.5 cm solid nodule is noted and stable from the prior exam.  A smaller 0.9 x 0.6 x 0.3 cm nodule is noted in the upper pole is slightly better visualized on the current examination.  Isthmus Thickness: 0.2 cm.  No nodules visualized.  Lymphadenopathy: None visualized.  Multiple nodules bilaterally. These are relatively stable from the previous exam from 2 years previous.  I reviewed patient's TFTs and they have been normal, with the exception of 1 slightly low TSH in 2015: 12/26/2017: TSH 1.87 12/23/2016: TSH 2.52 10/07/2015: TSH 0.85 Lab Results  Component Value Date   TSH 0.936 10/30/2014   TSH 1.25 07/02/2014   TSH 0.31 (L) 04/22/2014   TSH 0.820 07/25/2013   FREET4 0.83 07/02/2014   FREET4 1.02 04/22/2014    Pt denies: - feeling nodules in neck - hoarseness - dysphagia - choking - SOB with lying down At last visit, she was describing that she felt some neck compression when she turned her neck to the left. Now resolved.  Pt does not have a FH of thyroid ds. No FH of thyroid cancer. No h/o radiation tx to head or neck.  No seaweed or kelp. No recent contrast studies. No  herbal supplements. No Biotin use. No recent steroids use.   ROS: Constitutional: no weight gain/no weight loss, + fatigue, no subjective hyperthermia, no subjective hypothermia Eyes: no blurry vision, no xerophthalmia ENT: no sore throat, + see HPI Cardiovascular: no CP/+ SOB/+ palpitations/no leg swelling Respiratory: no cough/+ SOB/no wheezing Gastrointestinal: no N/no V/no D/no C/no acid reflux Musculoskeletal: no muscle aches/+ joint aches-bruised hip Skin: no rashes, no hair loss Neurological: no tremors/no numbness/no tingling/no dizziness  I reviewed pt's medications, allergies, PMH, social hx, family hx, and changes were  documented in the history of present illness. Otherwise, unchanged from my initial visit note.  Past Medical History:  Diagnosis Date  . Anxiety    Dr. [name]  . Asthma   . Bronchitis   . Depression   . HSV-2 (herpes simplex virus 2) infection   . Hyperlipidemia   . Insomnia   . Lateral epicondylitis right; 09/2014   Dr. [name]  . Multiple thyroid nodules 03/2014   more prominent on L>R; h/o benign bx. f/u 1 yr  . OSA (obstructive sleep apnea)    CPAP (uses sporadically)  . Trochanteric bursitis 01/2015  . Vaginal dryness    Past Surgical History:  Procedure Laterality Date  . CARDIOVERSION N/A 02/21/2018   Procedure: CARDIOVERSION;  Surgeon: Sueanne Margarita, MD;  [facility] ENDOSCOPY;  Service: Cardiovascular;  Laterality: N/A;  . CARDIOVERSION N/A 03/08/2018   Procedure: CARDIOVERSION;  Surgeon: Buford Dresser, MD;  Location: [facility] ENDOSCOPY;  Service: Cardiovascular;  Laterality: N/A;  . [place] / OOPHORECTOMY / A&P REPAIR  2008   pt states she has her ovaries  . CRYOTHERAPY    . DILATION AND CURETTAGE OF UTERUS    . HERNIA REPAIR  age 21   LIH  . HYSTEROTOMY    . KNEE SURGERY Right    twice--Dr. Onnie Graham, Dr. Lynann Bologna (25 years ago)   History   Social History  . Marital Status: Married    Spouse Name: N/A    Number of Children: 2   Occupational History  . School counselor - [place] History Main Topics  . Smoking status: Never Smoker   . Smokeless tobacco: Never Used  . Alcohol Use: Wine, beer 1-4 drinks 2xa mo  . Drug Use: No  . Sexual Activity: Yes    Birth Control/ Protection: Surgical     Comment: hysterectomy    Current Outpatient Medications on File Prior to Visit  Medication Sig Dispense Refill  . albuterol (PROVENTIL HFA;VENTOLIN HFA) 108 (90 BASE) MCG/ACT inhaler Inhale 2 puffs into the lungs every 6 (six) hours as needed for wheezing or shortness of breath. 1 Inhaler 1  . apixaban (ELIQUIS) 5 MG TABS  tablet Take 1 tablet (5 mg total) by mouth 2 (two) times daily. 180 tablet 0  . cetirizine (ZYRTEC) 10 MG tablet Take 10 mg by mouth at bedtime.     . dronedarone (MULTAQ) 400 MG tablet Take 1 tablet (400 mg total) by mouth 2 (two) times daily with a meal. 60 tablet 3  . estradiol (VIVELLE-DOT) 0.05 MG/24HR patch PLACE ONTO SKIN AND CHANGE TWICE A WEEK 8 patch 4  . Homeopathic Products (ARNICARE EX) Apply 1 application topically daily as needed (pain).    . montelukast (SINGULAIR) 10 MG tablet Take 1 tablet (10 mg total) by mouth at bedtime. 90 tablet 1  . traZODone (DESYREL) 100 MG tablet Take 100 mg by mouth at bedtime.    . valACYclovir (  VALTREX) 500 MG tablet Take one daily (Patient taking differently: Take 500 mg by mouth daily. ) 90 tablet 1   No current facility-administered medications on file prior to visit.    Allergies  Allergen Reactions  . Codeine Nausea And Vomiting   Family History  Problem Relation Age of Onset  . Bone cancer Paternal Grandmother   . Stroke Maternal Grandmother   . Heart attack Maternal Grandfather   . Stomach cancer Maternal Grandfather   . Heart attack Father 42       heart attacks at 47 and 62  . Alcohol abuse Father   . Hyperlipidemia Father   . Heart disease Mother   . Depression Mother   . Colon cancer Maternal Uncle   . Depression Sister   . Alcohol abuse Brother   . Hyperlipidemia Brother   . Anxiety disorder Brother   . Mental illness Daughter   . Alcohol abuse Sister   . Hyperlipidemia Sister   . Depression Sister   . Anxiety disorder Sister   . Diabetes Maternal Uncle    PE: BP 120/70   Pulse 66   Ht 5\' 6"  (1.676 m)   Wt 168 lb (76.2 kg)   SpO2 97%   BMI 27.12 kg/m  Wt Readings from Last 3 Encounters:  04/13/18 168 lb (76.2 kg)  04/05/18 168 lb 9.6 oz (76.5 kg)  03/16/18 170 lb (77.1 kg)   Constitutional: Slightly overweight, in NAD Eyes: PERRLA, EOMI, no exophthalmos ENT: moist mucous membranes, +L>R thyromegaly, no  cervical lymphadenopathy Cardiovascular: RRR, No MRG Respiratory: CTA B Gastrointestinal: abdomen soft, NT, ND, BS+ Musculoskeletal: no deformities, strength intact in all 4 Skin: moist, warm, no rashes Neurological: no tremor with outstretched hands, DTR normal in all 4  ASSESSMENT: 1. MNG   PLAN: 1. MNG  -Reviewed the reports of her previous thyroid ultrasounds along with the patient.  I also reviewed the images.  The nodules appear stable in size and aspect on the last thyroid ultrasound from 2017.  The dominant nodules are not very large (centimeter right thyroid nodule is a conglomerate of 2 separate nodules).  This nodule is mostly cystic, and the solid component appears degenerated.  This nodule has been biopsied before and the results were benign.  The other left thyroid nodule is smaller, isoechoic and without concerning features the right thyroid nodule is less than 1.5 cm and without concerning features.  Also, she does not have a family history of thyroid cancer or personal history of radiation therapy, all of this pointing towards benignity. -She does not have any neck compression symptoms -We discussed about repeating the thyroid ultrasound now, and if any of the nodules significantly changed ultrasound characteristics or size, to biopsy this.  Otherwise, I do not feel that she would benefit from further thyroid ultrasounds.  However, if she is to develop neck compression symptoms, she needs to let me know so we can repeat her ultrasound -Reviewed latest TSH from the summer and this was normal -She had questions about dronedarone and influence on her thyroid test.  I explained that amiodarone is the medication that influences the thyroid test, dronedarone does not. -I advised her to return to see me as needed  Orders Placed This Encounter  Procedures  . US Soft Tissue Head/Neck   - time spent with the patient: 15 minutes, of which >50% was spent in obtaining information about  her symptoms, reviewing her previous labs, imaging tests, counseling her about her condition (please  see the discussed topics above), and developing a plan to further investigate and treat it; she had a number of questions which I addressed.  US THYROID CLINICAL DATA:  Goiter. 57 year old female with multiple thyroid nodules. She has undergone prior biopsy the left lower thyroid nodule on 01/27/10.  EXAM: THYROID ULTRASOUND  TECHNIQUE: Ultrasound examination of the thyroid gland and adjacent soft tissues was performed.  COMPARISON:  Prior thyroid ultrasound 12/29/2015 and 11/14/2009  FINDINGS: Parenchymal Echotexture: Moderately heterogenous  Isthmus: 0.2 cm  Right lobe: 4.7 x 1.3 x 2.2 cm  Left lobe: 5.2 x 1.8 x 2.2 cm  _________________________________________________________  Estimated total number of nodules >/= 1 cm: 3  Number of spongiform nodules >/=  2 cm not described below (TR1): 0  Number of mixed cystic and solid nodules >/= 1.5 cm not described below (TR2): 0  _________________________________________________________  Nodule # 1:  Prior biopsy: No  Location: Right; Mid  Maximum size: 1.9 cm; Other 2 dimensions: 1.3 x 1.3 cm, previously, 1.9 x 1.5 x 1.1 cm  Composition: solid/almost completely solid (2)  Echogenicity: isoechoic (1)  Shape: not taller-than-wide (0)  Margins: ill-defined (0)  Echogenic foci: none (0)  ACR TI-RADS total points: 3.  ACR TI-RADS risk category:  TR3 (3 points).  Significant change in size (>/= 20% in two dimensions and minimal increase of 2 mm): No  Change in features: No  Change in ACR TI-RADS risk category: No  ACR TI-RADS recommendations:  Greater than 5 year stability consistent with benignity. No further follow-up required.  _________________________________________________________  The previously biopsied nodule in the left inferior gland has significantly decreased in size measuring 1.7 x 1.7 x 1.2  cm compared to 3.3 x 1.6 x 1.4 cm previously. Additional subcentimeter thyroid nodules present bilaterally. No further follow-up required.  IMPRESSION: 1. Greater than 5 year stability of the previously biopsied nodule in the left inferior gland. 2. Greater than 5 year stability of the TI-RADS category 3 nodule in the right mid gland consistent with benignity. No further follow-up required.  The above is in keeping with the ACR TI-RADS recommendations - J Am Coll Radiol 2017;14:587-595.  Electronically Signed   By: Jacqulynn Cadet M.D.   On: 05/26/2018 14:07   Philemon Kingdom, MD PhD [facility] Endocrinology

## 2018-04-17 DIAGNOSIS — R42 Dizziness and giddiness: Secondary | ICD-10-CM | POA: Diagnosis not present

## 2018-04-17 DIAGNOSIS — I4819 Other persistent atrial fibrillation: Secondary | ICD-10-CM | POA: Diagnosis not present

## 2018-04-17 DIAGNOSIS — D708 Other neutropenia: Secondary | ICD-10-CM | POA: Diagnosis not present

## 2018-04-24 DIAGNOSIS — R42 Dizziness and giddiness: Secondary | ICD-10-CM | POA: Diagnosis not present

## 2018-04-24 DIAGNOSIS — M9901 Segmental and somatic dysfunction of cervical region: Secondary | ICD-10-CM | POA: Diagnosis not present

## 2018-04-24 DIAGNOSIS — R293 Abnormal posture: Secondary | ICD-10-CM | POA: Diagnosis not present

## 2018-04-26 ENCOUNTER — Encounter: Payer: Self-pay | Admitting: Internal Medicine

## 2018-04-26 ENCOUNTER — Ambulatory Visit (INDEPENDENT_AMBULATORY_CARE_PROVIDER_SITE_OTHER): Payer: Commercial Managed Care - PPO | Admitting: Internal Medicine

## 2018-04-26 VITALS — BP 110/70 | HR 64 | Ht 66.0 in | Wt 168.0 lb

## 2018-04-26 DIAGNOSIS — G4733 Obstructive sleep apnea (adult) (pediatric): Secondary | ICD-10-CM | POA: Diagnosis not present

## 2018-04-26 DIAGNOSIS — Z9989 Dependence on other enabling machines and devices: Secondary | ICD-10-CM

## 2018-04-26 DIAGNOSIS — R002 Palpitations: Secondary | ICD-10-CM

## 2018-04-26 DIAGNOSIS — I4819 Other persistent atrial fibrillation: Secondary | ICD-10-CM

## 2018-04-26 NOTE — Patient Instructions (Addendum)
Medication Instructions:  Your physician recommends that you continue on your current medications as directed. Please refer to the Current Medication list given to you today.  If you need a refill on your cardiac medications before your next appointment, please call your pharmacy.   Lab work: None ordered.  If you have labs (blood work) drawn today and your tests are completely normal, you will receive your results only by: Marland Kitchen MyChart Message (if you have MyChart) OR . A paper copy in the mail If you have any lab test that is abnormal or we need to change your treatment, we will call you to review the results.  Testing/Procedures: None ordered.  Follow-Up:  Your physician wants you to follow-up in: 3 months with Dr. Rayann Heman.     At [facility], you and your health needs are our priority.  As part of our continuing mission to provide you with exceptional heart care, we have created designated Provider Care Teams.  These Care Teams include your primary Cardiologist (physician) and Advanced Practice Providers (APPs -  Physician Assistants and Nurse Practitioners) who all work together to provide you with the care you need, when you need it.  Any Other Special Instructions Will Be Listed Below (If Applicable).

## 2018-04-26 NOTE — Progress Notes (Signed)
PCP: [name], MD   Primary EP: Dr Rayann Heman  Debra Hardin is a 57 y.o. female who presents today for routine electrophysiology followup.  Since last being seen in our clinic, the patient reports doing very well.  No afib.  Fatigue is much improved.  Today, she denies symptoms of palpitations, chest pain, shortness of breath,  lower extremity edema, dizziness, presyncope, or syncope.  The patient is otherwise without complaint today.   Past Medical History:  Diagnosis Date  . Anxiety    Dr. [name]  . Asthma   . Bronchitis   . Depression   . HSV-2 (herpes simplex virus 2) infection   . Hyperlipidemia   . Insomnia   . Lateral epicondylitis right; 09/2014   Dr. [name]  . Multiple thyroid nodules 03/2014   more prominent on L>R; h/o benign bx. f/u 1 yr  . OSA (obstructive sleep apnea)    CPAP (uses sporadically)  . Trochanteric bursitis 01/2015  . Vaginal dryness    Past Surgical History:  Procedure Laterality Date  . CARDIOVERSION N/A 02/21/2018   Procedure: CARDIOVERSION;  Surgeon: Sueanne Margarita, MD;  Location: [facility] ENDOSCOPY;  Service: Cardiovascular;  Laterality: N/A;  . CARDIOVERSION N/A 03/08/2018   Procedure: CARDIOVERSION;  Surgeon: Buford Dresser, MD;  Location: [facility] ENDOSCOPY;  Service: Cardiovascular;  Laterality: N/A;  . [place] / OOPHORECTOMY / A&P REPAIR  2008   pt states she has her ovaries  . CRYOTHERAPY    . DILATION AND CURETTAGE OF UTERUS    . HERNIA REPAIR  age 22   LIH  . HYSTEROTOMY    . KNEE SURGERY Right    twice--Dr. Onnie Graham, Dr. Lynann Bologna (25 years ago)    ROS- all systems are reviewed and negatives except as per HPI above  Current Outpatient Medications  Medication Sig Dispense Refill  . albuterol (PROVENTIL HFA;VENTOLIN HFA) 108 (90 BASE) MCG/ACT inhaler Inhale 2 puffs into the lungs every 6 (six) hours as needed for wheezing or shortness of breath. 1 Inhaler 1  . apixaban (ELIQUIS) 5 MG TABS tablet  Take 1 tablet (5 mg total) by mouth 2 (two) times daily. 180 tablet 0  . cetirizine (ZYRTEC) 10 MG tablet Take 10 mg by mouth at bedtime.     . dronedarone (MULTAQ) 400 MG tablet Take 1 tablet (400 mg total) by mouth 2 (two) times daily with a meal. 60 tablet 3  . estradiol (VIVELLE-DOT) 0.05 MG/24HR patch PLACE ONTO SKIN AND CHANGE TWICE A WEEK 8 patch 4  . Homeopathic Products (ARNICARE EX) Apply 1 application topically daily as needed (pain).    . montelukast (SINGULAIR) 10 MG tablet Take 1 tablet (10 mg total) by mouth at bedtime. 90 tablet 1  . traZODone (DESYREL) 100 MG tablet Take 100 mg by mouth at bedtime.    . valACYclovir (VALTREX) 500 MG tablet Take one daily (Patient taking differently: Take 500 mg by mouth daily. ) 90 tablet 1   No current facility-administered medications for this visit.     Physical Exam: Vitals:   04/26/18 1519  BP: 110/70  Pulse: 64  SpO2: 98%  Weight: 168 lb (76.2 kg)  Height: 5\' 6"  (1.676 m)    GEN- The patient is well appearing, alert and oriented x 3 today.   Head- normocephalic, atraumatic Eyes-  Sclera clear, conjunctiva pink Ears- hearing intact Oropharynx- clear Lungs- Clear to ausculation bilaterally, normal work of breathing Heart- Regular rate and rhythm, no murmurs,  rubs or gallops, PMI not laterally displaced GI- soft, NT, ND, + BS Extremities- no clubbing, cyanosis, or edema  Wt Readings from Last 3 Encounters:  04/26/18 168 lb (76.2 kg)  04/13/18 168 lb (76.2 kg)  04/05/18 168 lb 9.6 oz (76.5 kg)    EKG tracing ordered today is personally reviewed and shows sinus rhythm 64 bpm, PR 204 msec, QRS 88 msec, Qtc 445 msec  Assessment and Plan:  1. Persistent afib Doing very well with multaq Chads2vasc score is 1.    Fatigue is much improved  2. OSA Followed by Dr Ron Parker Has a new mouth piece coming in a few weeks  No changes today Return in 3 months  [name] MD, [facility] 04/26/2018 4:09 PM

## 2018-04-27 DIAGNOSIS — M9901 Segmental and somatic dysfunction of cervical region: Secondary | ICD-10-CM | POA: Diagnosis not present

## 2018-04-27 DIAGNOSIS — R42 Dizziness and giddiness: Secondary | ICD-10-CM | POA: Diagnosis not present

## 2018-04-27 DIAGNOSIS — R293 Abnormal posture: Secondary | ICD-10-CM | POA: Diagnosis not present

## 2018-05-01 DIAGNOSIS — R42 Dizziness and giddiness: Secondary | ICD-10-CM | POA: Diagnosis not present

## 2018-05-01 DIAGNOSIS — M9901 Segmental and somatic dysfunction of cervical region: Secondary | ICD-10-CM | POA: Diagnosis not present

## 2018-05-01 DIAGNOSIS — R293 Abnormal posture: Secondary | ICD-10-CM | POA: Diagnosis not present

## 2018-05-26 ENCOUNTER — Ambulatory Visit
Admission: RE | Admit: 2018-05-26 | Discharge: 2018-05-26 | Disposition: A | Discharge status: Home / Self Care | Payer: Commercial Managed Care - PPO | Source: Ambulatory Visit | Attending: Internal Medicine | Admitting: Internal Medicine

## 2018-05-26 DIAGNOSIS — E042 Nontoxic multinodular goiter: Secondary | ICD-10-CM

## 2018-05-26 DIAGNOSIS — E041 Nontoxic single thyroid nodule: Secondary | ICD-10-CM | POA: Diagnosis not present

## 2018-06-15 ENCOUNTER — Encounter: Payer: Self-pay | Admitting: Sports Medicine

## 2018-06-15 ENCOUNTER — Ambulatory Visit (INDEPENDENT_AMBULATORY_CARE_PROVIDER_SITE_OTHER): Payer: Commercial Managed Care - PPO | Admitting: Sports Medicine

## 2018-06-15 VITALS — BP 122/78 | Ht 66.0 in | Wt 172.0 lb

## 2018-06-15 DIAGNOSIS — M25552 Pain in left hip: Secondary | ICD-10-CM

## 2018-06-15 NOTE — Progress Notes (Signed)
Subjective:    CC: F/U L Hip Pain  HPI: Debra Hardin is a 57 year old F with PMHx significant for multinodular  Goiter, ovarian cyst, a fib, and L hip pain since June who presents to clinic for follow up on her L hip pain. She was last seen on 03/16/18. A hip Xray at the time was unremarkable. An MRI performed 03/25/18 was also normal.   Today she states the pain is moderately improved from prior, about a 3/10 constant dull aching pain at its worst. She is no longer waking up from sleep or having significant difficulty ambulating due to the pain. However,  she still finds she has bothersome pain in the inner part of hip particularly when lifting up her leg, when driving, or if sitting for too long. She stopped doing leg exercises shortly after her last visit because they were causing significant pain, but now feels that her left side is weaker compared to the R side.  She recently had a thyroid US on 05/26/18 that showed decreased nodular size over 5 year interval.   I reviewed the past medical history, family history, social history, surgical history, and allergies today and no changes were needed.  Please see the problem list section below in epic for further details.  Past Medical History: Past Medical History:  Diagnosis Date  . Anxiety    Dr. [name]  . Asthma   . Bronchitis   . Depression   . HSV-2 (herpes simplex virus 2) infection   . Hyperlipidemia   . Insomnia   . Lateral epicondylitis right; 09/2014   Dr. [name]  . Multiple thyroid nodules 03/2014   more prominent on L>R; h/o benign bx. f/u 1 yr  . OSA (obstructive sleep apnea)    CPAP (uses sporadically)  . Trochanteric bursitis 01/2015  . Vaginal dryness    Past Surgical History: Past Surgical History:  Procedure Laterality Date  . CARDIOVERSION N/A 02/21/2018   Procedure: CARDIOVERSION;  Surgeon: Sueanne Margarita, MD;  Location: [facility] ENDOSCOPY;  Service: Cardiovascular;  Laterality: N/A;  . CARDIOVERSION N/A  03/08/2018   Procedure: CARDIOVERSION;  Surgeon: Buford Dresser, MD;  Location: [facility] ENDOSCOPY;  Service: Cardiovascular;  Laterality: N/A;  . [place] / OOPHORECTOMY / A&P REPAIR  2008   pt states she has her ovaries  . CRYOTHERAPY    . DILATION AND CURETTAGE OF UTERUS    . HERNIA REPAIR  age 29   LIH  . HYSTEROTOMY    . KNEE SURGERY Right    twice--Dr. Onnie Graham, Dr. Lynann Bologna (25 years ago)   Social History: Social History   Socioeconomic History  . Marital status: Married    Spouse name: Not on file  . Number of children: Not on file  . Years of education: Not on file  . Highest education level: Not on file  Occupational History  . Not on file  Social Needs  . Financial resource strain: Not on file  . Food insecurity:    Worry: Not on file    Inability: Not on file  . Transportation needs:    Medical: Not on file    Non-medical: Not on file  Tobacco Use  . Smoking status: Never Smoker  . Smokeless tobacco: Never Used  Substance and Sexual Activity  . Alcohol use: Yes    Alcohol/week: 0.0 standard drinks    Comment: occasional (up to 4/week, not every week)  . Drug use: No  . Sexual activity:  Yes    Birth control/protection: Surgical    Comment: hysterectomy   Lifestyle  . Physical activity:    Days per week: Not on file    Minutes per session: Not on file  . Stress: Not on file  Relationships  . Social connections:    Talks on phone: Not on file    Gets together: Not on file    Attends religious service: Not on file    Active member of club or organization: Not on file    Attends meetings of clubs or organizations: Not on file    Relationship status: Not on file  Other Topics Concern  . Not on file  Social History Narrative   Married.  2 daughters (one of whom has 2 rats).  Oldest is in college.   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 Bone cancer Paternal  Grandmother   . Stroke Maternal Grandmother   . Heart attack Maternal Grandfather   . Stomach cancer Maternal Grandfather   . Heart attack Father 42       heart attacks at 21 and 52  . Alcohol abuse Father   . Hyperlipidemia Father   . Heart disease Mother   . Depression Mother   . Colon cancer Maternal Uncle   . Depression Sister   . Alcohol abuse Brother   . Hyperlipidemia Brother   . Anxiety disorder Brother   . Mental illness Daughter   . Alcohol abuse Sister   . Hyperlipidemia Sister   . Depression Sister   . Anxiety disorder Sister   . Diabetes Maternal Uncle    Allergies: Allergies  Allergen Reactions  . Codeine Nausea And Vomiting   Medications: See med rec.  Review of Systems: No fevers, chills, night sweats, weight loss, chest pain, new illnesses, or shortness of breath.   Objective:    General: Well Developed, well nourished, and in no acute distress.  Neuro: Alert and oriented x3, extra-ocular muscles intact, sensation grossly intact.  HEENT: Normocephalic, atraumatic, pupils equal round reactive to light, neck supple, no masses, no lymphadenopathy, thyroid nonpalpable.  Skin: Warm and dry, no rashes. Cardiac: Regular rate and rhythm, no murmurs rubs or gallops, no lower extremity edema.  Respiratory: Clear to auscultation bilaterally. Not using accessory muscles, speaking in full sentences.  MSK:  L Hip: Inspection: No deformity, swelling, or bruising noted. Palpation: Mild tenderness in lateral hip over greater trochanter and in anteromedial hip ROM: Normal range of motion with flexion, extension, internal rotation, external rotation, abduction Strength: 5/5 strength with hip abduction extension, internal rotation, and external rotation. 4+/5 strength with hip flexion  FADIR: Positive  Mid upper back: Inspection: No deformity, swelling, bruising, eryrhema noted Poor movement to manipulation of left upper back facet joints Palpation: mild tenderness to  palpation    Impression and Recommendations:     Left Hip Pain - There has been interval improvement in severity of L hip pain. However, in light of normal hip XR and MRI, persistence is concerning  Unlikely related to patient's thyroid nodules which have decreased in size per recent US. Ovarian cysts possible etiology  Of pain however, per report, no concerns from patient's other physicians for cysts.    Concern remains high for labral irritation/tear as such injuries are may not always be revealed by MRI imagining, but distribution of pain and exam findings suggest injury in this region.  - Strengthening  exercises as tolerated - F/U in 3 months to evaluate pain  Upper back pain: - Concern for tight facet joint Referred to Dr. Charlann Boxer, [facility]  May be good candidate for manipulation  Magda Kiel, MD/[facility] - PGY 2  I observed and examined the patient with the resident and agree with assessment and plan.  Note reviewed and modified by me. Ila Mcgill, MD

## 2018-06-15 NOTE — Patient Instructions (Addendum)
We think that there is labral irritation that would benefit from some gentle exercises. While standing: - Do lateral leg lifts (on the Left side in particular) - Do leg lifts, up to 45 degrees - Do cross over walks x 10 - Touch your hand with your knee x 10 - Recumbent or regular biking 2-3 times a week - Follow up in 3 month  For your upper back pain, we recommend: Dr. Minda [facility]o [facility] at [place] [place]

## 2018-06-15 NOTE — Assessment & Plan Note (Signed)
No other hip injury noted By exam FADIR is still painful Possibility of labral injury  She is stable with this  Pain usually 2 to 3 of 10 and sometimes not present  Will encourage biking, easy HEP

## 2018-06-23 ENCOUNTER — Other Ambulatory Visit: Payer: Self-pay | Admitting: *Deleted

## 2018-06-23 MED ORDER — DRONEDARONE HCL 400 MG PO TABS: 400.0000 mg | ORAL_TABLET | Freq: Two times a day (BID) | ORAL | 9 refills | Status: DC

## 2018-06-23 NOTE — Telephone Encounter (Signed)
Patient requesting Dr Rayann Heman to refill multaq, prescribed by Roderic Palau. Okay to refill? Please advise. Thanks, MI

## 2018-06-23 NOTE — Telephone Encounter (Signed)
Yes

## 2018-07-11 ENCOUNTER — Telehealth: Payer: Self-pay | Admitting: Internal Medicine

## 2018-07-11 DIAGNOSIS — Z86018 Personal history of other benign neoplasm: Secondary | ICD-10-CM | POA: Diagnosis not present

## 2018-07-11 DIAGNOSIS — L814 Other melanin hyperpigmentation: Secondary | ICD-10-CM | POA: Diagnosis not present

## 2018-07-11 DIAGNOSIS — D225 Melanocytic nevi of trunk: Secondary | ICD-10-CM | POA: Diagnosis not present

## 2018-07-11 MED ORDER — APIXABAN 5 MG PO TABS: 5.0000 mg | ORAL_TABLET | Freq: Two times a day (BID) | ORAL | 6 refills | Status: DC

## 2018-07-11 NOTE — Telephone Encounter (Signed)
Pt last saw Dr Rayann Heman 04/26/18, last labs 02/21/18 Creat 0.96, age 58, weight 78kg, based on specified criteria pt is on appropriate dosage of Eliquis 5mg  BID.  Will refill rx.

## 2018-07-11 NOTE — Telephone Encounter (Signed)
New message     *STAT* If patient is at the pharmacy, call can be transferred to refill team.   1. Which medications need to be refilled? (please list name of each medication and dose if known) Eliquis 5mg   2. Which pharmacy/location (including street and city if local pharmacy) is medication to be sent to? [facility]  3. Do they need a 30 day or 90 day supply? 30 Day Supply

## 2018-07-12 DIAGNOSIS — R42 Dizziness and giddiness: Secondary | ICD-10-CM | POA: Diagnosis not present

## 2018-07-12 DIAGNOSIS — D708 Other neutropenia: Secondary | ICD-10-CM | POA: Diagnosis not present

## 2018-07-12 DIAGNOSIS — I4819 Other persistent atrial fibrillation: Secondary | ICD-10-CM | POA: Diagnosis not present

## 2018-08-07 ENCOUNTER — Encounter: Payer: Self-pay | Admitting: Internal Medicine

## 2018-08-07 ENCOUNTER — Ambulatory Visit (INDEPENDENT_AMBULATORY_CARE_PROVIDER_SITE_OTHER): Payer: Commercial Managed Care - PPO | Admitting: Internal Medicine

## 2018-08-07 VITALS — BP 116/70 | HR 64 | Ht 66.0 in | Wt 180.2 lb

## 2018-08-07 DIAGNOSIS — I4819 Other persistent atrial fibrillation: Secondary | ICD-10-CM

## 2018-08-07 DIAGNOSIS — G4733 Obstructive sleep apnea (adult) (pediatric): Secondary | ICD-10-CM | POA: Diagnosis not present

## 2018-08-07 DIAGNOSIS — Z9989 Dependence on other enabling machines and devices: Secondary | ICD-10-CM

## 2018-08-07 NOTE — Patient Instructions (Addendum)
Medication Instructions:  Your physician has recommended you make the following change in your medication:   1.  Stop taking Eliquis   Labwork: None ordered.  Testing/Procedures: None ordered.  Follow-Up: Your physician wants you to follow-up in:  6 months with the AFIB clinic.   You will receive a reminder letter in the mail two months in advance. If you don't receive a letter, please call our office to schedule the follow-up appointment.  Any Other Special Instructions Will Be Listed Below (If Applicable).  If you need a refill on your cardiac medications before your next appointment, please call your pharmacy.

## 2018-08-07 NOTE — Progress Notes (Signed)
PCP: [name], MD   Primary EP: Dr Rayann Heman  Debra Hardin is a 58 y.o. female who presents today for routine electrophysiology followup.  Since last being seen in our clinic, the patient reports doing very well.  Today, she denies symptoms of palpitations, chest pain, shortness of breath,  lower extremity edema, dizziness, presyncope, or syncope.  The patient is otherwise without complaint today.   Past Medical History:  Diagnosis Date  . Anxiety    Dr. [name]  . Asthma   . Bronchitis   . Depression   . HSV-2 (herpes simplex virus 2) infection   . Hyperlipidemia   . Insomnia   . Lateral epicondylitis right; 09/2014   Dr. [name]  . Multiple thyroid nodules 03/2014   more prominent on L>R; h/o benign bx. f/u 1 yr  . OSA (obstructive sleep apnea)    CPAP (uses sporadically)  . Trochanteric bursitis 01/2015  . Vaginal dryness    Past Surgical History:  Procedure Laterality Date  . CARDIOVERSION N/A 02/21/2018   Procedure: CARDIOVERSION;  Surgeon: Sueanne Margarita, MD;  Location: [facility] ENDOSCOPY;  Service: Cardiovascular;  Laterality: N/A;  . CARDIOVERSION N/A 03/08/2018   Procedure: CARDIOVERSION;  Surgeon: Buford Dresser, MD;  Location: [facility] ENDOSCOPY;  Service: Cardiovascular;  Laterality: N/A;  . [place] / OOPHORECTOMY / A&P REPAIR  2008   pt states she has her ovaries  . CRYOTHERAPY    . DILATION AND CURETTAGE OF UTERUS    . HERNIA REPAIR  age 68   LIH  . HYSTEROTOMY    . KNEE SURGERY Right    twice--Dr. Onnie Graham, Dr. Lynann Bologna (25 years ago)    ROS- all systems are reviewed and negatives except as per HPI above  Current Outpatient Medications  Medication Sig Dispense Refill  . albuterol (PROVENTIL HFA;VENTOLIN HFA) 108 (90 BASE) MCG/ACT inhaler Inhale 2 puffs into the lungs every 6 (six) hours as needed for wheezing or shortness of breath. 1 Inhaler 1  . apixaban (ELIQUIS) 5 MG TABS tablet Take 1 tablet (5 mg total) by mouth 2  (two) times daily. 60 tablet 6  . cetirizine (ZYRTEC) 10 MG tablet Take 10 mg by mouth at bedtime.     . dronedarone (MULTAQ) 400 MG tablet Take 1 tablet (400 mg total) by mouth 2 (two) times daily with a meal. 60 tablet 9  . estradiol (VIVELLE-DOT) 0.05 MG/24HR patch PLACE ONTO SKIN AND CHANGE TWICE A WEEK 8 patch 4  . Homeopathic Products (ARNICARE EX) Apply 1 application topically daily as needed (pain).    . montelukast (SINGULAIR) 10 MG tablet Take 1 tablet (10 mg total) by mouth at bedtime. 90 tablet 1  . traZODone (DESYREL) 100 MG tablet Take 100 mg by mouth at bedtime.    . valACYclovir (VALTREX) 500 MG tablet Take one daily (Patient taking differently: Take 500 mg by mouth daily. ) 90 tablet 1   No current facility-administered medications for this visit.     Physical Exam: Vitals:   08/07/18 1641  BP: 116/70  Pulse: 64  SpO2: 97%  Weight: 180 lb 3.2 oz (81.7 kg)  Height: 5\' 6"  (1.676 m)    GEN- The patient is well appearing, alert and oriented x 3 today.   Head- normocephalic, atraumatic Eyes-  Sclera clear, conjunctiva pink Ears- hearing intact Oropharynx- clear Lungs- Clear to ausculation bilaterally, normal work of breathing Heart- Regular rate and rhythm, no murmurs, rubs or gallops, PMI not laterally  displaced GI- soft, NT, ND, + BS Extremities- no clubbing, cyanosis, or edema  Wt Readings from Last 3 Encounters:  08/07/18 180 lb 3.2 oz (81.7 kg)  06/15/18 172 lb (78 kg)  04/26/18 168 lb (76.2 kg)    EKG tracing ordered today is personally reviewed and shows sinus rhythm 64 bpm, PR 214 msec, Qtc 437 msec  Assessment and Plan:  1. Persistent afib Doing well with multaq chads2vasc score is 1.  Stop eliquis as per guidelines  2. OSA Followed by Dr Ron Parker  No changes  Return in 6 months to AF clinic  [name] MD, [facility] 08/07/2018 4:56 PM

## 2018-10-26 DIAGNOSIS — E042 Nontoxic multinodular goiter: Secondary | ICD-10-CM | POA: Diagnosis not present

## 2018-10-26 DIAGNOSIS — I4819 Other persistent atrial fibrillation: Secondary | ICD-10-CM | POA: Diagnosis not present

## 2018-10-26 DIAGNOSIS — G4709 Other insomnia: Secondary | ICD-10-CM | POA: Diagnosis not present

## 2019-02-07 ENCOUNTER — Other Ambulatory Visit: Payer: Self-pay

## 2019-02-07 ENCOUNTER — Ambulatory Visit (HOSPITAL_COMMUNITY)
Admission: RE | Admit: 2019-02-07 | Discharge: 2019-02-07 | Disposition: A | Discharge status: Home / Self Care | Payer: Commercial Managed Care - PPO | Source: Ambulatory Visit | Attending: Nurse Practitioner | Admitting: Nurse Practitioner

## 2019-02-07 ENCOUNTER — Encounter (HOSPITAL_COMMUNITY): Payer: Self-pay | Admitting: Nurse Practitioner

## 2019-02-07 VITALS — BP 118/74 | HR 67 | Ht 66.0 in | Wt 186.0 lb

## 2019-02-07 DIAGNOSIS — Z90710 Acquired absence of both cervix and uterus: Secondary | ICD-10-CM | POA: Insufficient documentation

## 2019-02-07 DIAGNOSIS — Z79899 Other long term (current) drug therapy: Secondary | ICD-10-CM | POA: Diagnosis not present

## 2019-02-07 DIAGNOSIS — I480 Paroxysmal atrial fibrillation: Secondary | ICD-10-CM | POA: Diagnosis not present

## 2019-02-07 DIAGNOSIS — Z800 Family history of malignant neoplasm of digestive organs: Secondary | ICD-10-CM | POA: Insufficient documentation

## 2019-02-07 DIAGNOSIS — Z833 Family history of diabetes mellitus: Secondary | ICD-10-CM | POA: Diagnosis not present

## 2019-02-07 DIAGNOSIS — Z8249 Family history of ischemic heart disease and other diseases of the circulatory system: Secondary | ICD-10-CM | POA: Diagnosis not present

## 2019-02-07 DIAGNOSIS — G4733 Obstructive sleep apnea (adult) (pediatric): Secondary | ICD-10-CM | POA: Insufficient documentation

## 2019-02-07 DIAGNOSIS — R9431 Abnormal electrocardiogram [ECG] [EKG]: Secondary | ICD-10-CM | POA: Diagnosis not present

## 2019-02-07 DIAGNOSIS — J45909 Unspecified asthma, uncomplicated: Secondary | ICD-10-CM | POA: Diagnosis not present

## 2019-02-07 DIAGNOSIS — Z885 Allergy status to narcotic agent status: Secondary | ICD-10-CM | POA: Insufficient documentation

## 2019-02-07 DIAGNOSIS — I4891 Unspecified atrial fibrillation: Secondary | ICD-10-CM

## 2019-02-07 DIAGNOSIS — Z808 Family history of malignant neoplasm of other organs or systems: Secondary | ICD-10-CM | POA: Insufficient documentation

## 2019-02-07 NOTE — Progress Notes (Addendum)
Primary Care Physician: [name], MD Referring Physician: Same EP: Dr. Lars Mage Debra Hardin is a 58 y.o. female with a h/o multiple thyroid nodules, mild OSA in the past, not using cpap, that is the afib clinic for evaluation after being found to have new onset afib at the time of colonoscopy last week. Pt did go on to  have the procedure without event. She continues in afib today, rate controlled. She reports that she has noted fluttering in her chest for around one year. She does notice palpitations, fatigue, no unusual shortness of breath. Is drinking some alcohol, minimal caffeine now, no tobacco,. H/o of mildly sleep apnea by sleep study 8 years ago, never really used CPAP. Admits to a 10 lb weight gain over the last 6-9 months. She has a chadsvasc score of 1. 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  F/u afib clinic, 02/01/18. She remains for the most part in rate controlled afib, without rate control meds. BP has been running on the lower side so that would make use of rate control difficult.. She has been on anticoagulation for almost one week now , no issues with bleeding. Discussed the plan is still to purse cardioversion  after 3 weeks of uninterrupted anticoagulation. Echo reviewed with pt and does show aneurysm of the atrial septal wall without PFO noted. I had reviewed this with Dr. Aundra Dubin and he said no w/u was needed. Would only be significant if pt had had a stroke.   F/u in afib clinic, f/u cardioversion that was not successful despite being shocked x 3 times. Discussed options to restore SR and pt would prefer to try Multaq. No missed doses of eliquis. She had a cmet at MD's office recently with normal liver enzymes.  F/u 9/9. Cardioversion with multaq on board was successful and she remains in SR today. She however, does not feel that improved. Still has some fatigue but is happy to be back in SR.  F/u in afib clinic,02/07/19. She is staying in [place] with Multaq and feels well. Fatigue greatly improved. She has had multiple sleep studies thru Dr. Maxwell Caul as she did test positive for sleep apnea but did not tolerate cpap. She went on to have an oral appliance made that controlled the apnea but adversely affected her bite.thru additional sleep studies it was found that if she wore a tennis ball t shirt and stayed on her side, she would not snore. So this is what she is doing currently.   Today, she denies symptoms of palpitations, chest pain, shortness of breath, orthopnea, PND, lower extremity edema, dizziness, presyncope, syncope, or neurologic sequela. The patient is tolerating medications without difficulties and is otherwise without complaint today.   Past Medical History:  Diagnosis Date  . Anxiety    Dr. [name]  . Asthma   . Bronchitis   . Depression   . HSV-2 (herpes simplex virus 2) infection   . Hyperlipidemia   . Insomnia   . Lateral epicondylitis right; 09/2014   Dr. [name]  . Multiple thyroid nodules 03/2014   more prominent on L>R; h/o benign bx. f/u 1 yr  . OSA (obstructive sleep apnea)    CPAP (uses sporadically)  . Trochanteric bursitis 01/2015  . Vaginal dryness    Past Surgical History:  Procedure Laterality Date  . CARDIOVERSION N/A 02/21/2018   Procedure: CARDIOVERSION;  Surgeon: Sueanne Margarita, MD;  Location: [place];  Service: Cardiovascular;  Laterality: N/A;  . CARDIOVERSION N/A 03/08/2018   Procedure: CARDIOVERSION;  Surgeon: Buford Dresser, MD;  Location: [facility] ENDOSCOPY;  Service: Cardiovascular;  Laterality: N/A;  . COMBINED HYSTERECTOMY VAGINAL / OOPHORECTOMY / A&P REPAIR  2008   pt states she has her ovaries  . CRYOTHERAPY    . DILATION AND CURETTAGE OF UTERUS    . HERNIA REPAIR  age 78   [facility]  . HYSTEROTOMY    . KNEE SURGERY Right    twice--Dr. Onnie Graham, Dr. Lynann Bologna (25 years ago)    Current Outpatient Medications  Medication Sig  Dispense Refill  . albuterol (PROVENTIL HFA;VENTOLIN HFA) 108 (90 BASE) MCG/ACT inhaler Inhale 2 puffs into the lungs every 6 (six) hours as needed for wheezing or shortness of breath. 1 Inhaler 1  . cetirizine (ZYRTEC) 10 MG tablet Take 10 mg by mouth at bedtime.     . dronedarone (MULTAQ) 400 MG tablet Take 1 tablet (400 mg total) by mouth 2 (two) times daily with a meal. 60 tablet 9  . estradiol (VIVELLE-DOT) 0.05 MG/24HR patch PLACE ONTO SKIN AND CHANGE TWICE A WEEK 8 patch 4  . montelukast (SINGULAIR) 10 MG tablet Take 1 tablet (10 mg total) by mouth at bedtime. 90 tablet 1  . traZODone (DESYREL) 100 MG tablet Take 50 mg by mouth at bedtime.     . valACYclovir (VALTREX) 500 MG tablet Take one daily (Patient taking differently: Take 500 mg by mouth daily. ) 90 tablet 1   No current facility-administered medications for this encounter.     Allergies  Allergen Reactions  . Codeine Nausea And Vomiting    Social History   Socioeconomic History  . Marital status: Married    Spouse name: Not on file  . Number of children: Not on file  . Years of education: Not on file  . Highest education level: Not on file  Occupational History  . Not on file  Social Needs  . Financial resource strain: Not on file  . Food insecurity    Worry: Not on file    Inability: Not on file  . Transportation needs    Medical: Not on file    Non-medical: Not on file  Tobacco Use  . Smoking status: Never Smoker  . Smokeless tobacco: Never Used  Substance and Sexual Activity  . Alcohol use: Yes    Alcohol/week: 0.0 standard drinks    Comment: occasional (up to 4/week, not every week)  . Drug use: No  . Sexual activity: Yes    Birth control/protection: Surgical    Comment: hysterectomy   Lifestyle  . Physical activity    Days per week: Not on file    Minutes per session: Not on file  . Stress: Not on file  Relationships  . Social Herbalist on phone: Not on file    Gets together: Not  on file    Attends religious service: Not on file    Active member of club or organization: Not on file    Attends meetings of clubs or organizations: Not on file    Relationship status: Not on file  . Intimate partner violence    Fear of current or ex partner: Not on file    Emotionally abused: Not on file    Physically abused: Not on file    Forced sexual activity: Not on file  Other Topics Concern  . Not on file  Social History Narrative  Married.  2 daughters (one of whom has 2 rats).  Oldest is in college.   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 Bone cancer Paternal Grandmother   . Stroke Maternal Grandmother   . Heart attack Maternal Grandfather   . Stomach cancer Maternal Grandfather   . Heart attack Father 42       heart attacks at 48 and 64  . Alcohol abuse Father   . Hyperlipidemia Father   . Heart disease Mother   . Depression Mother   . Colon cancer Maternal Uncle   . Depression Sister   . Alcohol abuse Brother   . Hyperlipidemia Brother   . Anxiety disorder Brother   . Mental illness Daughter   . Alcohol abuse Sister   . Hyperlipidemia Sister   . Depression Sister   . Anxiety disorder Sister   . Diabetes Maternal Uncle     ROS- All systems are reviewed and negative except as per the HPI above  Physical Exam: Vitals:   02/07/19 0917  BP: 118/74  Pulse: 67  Weight: 84.4 kg  Height: 5\' 6"  (1.676 m)   Wt Readings from Last 3 Encounters:  02/07/19 84.4 kg  08/07/18 81.7 kg  06/15/18 78 kg    Labs: Lab Results  Component Value Date   NA 139 02/21/2018   K 4.5 02/21/2018   CL 106 02/21/2018   CO2 28 02/21/2018   GLUCOSE 91 02/21/2018   BUN 14 02/21/2018   CREATININE 0.96 02/21/2018   CALCIUM 8.9 02/21/2018   No results found for: INR Lab Results  Component Value Date   CHOL 168 10/30/2014   HDL 68 10/30/2014   LDLCALC 90 10/30/2014   TRIG 50 10/30/2014     GEN- The patient is  well appearing, alert and oriented x 3 today.   Head- normocephalic, atraumatic Eyes-  Sclera clear, conjunctiva pink Ears- hearing intact Oropharynx- clear Neck- supple, no JVP Lymph- no cervical lymphadenopathy Lungs- Clear to ausculation bilaterally, normal work of breathing Heart- irregular rate and rhythm, no murmurs, rubs or gallops, PMI not laterally displaced GI- soft, NT, ND, + BS Extremities- no clubbing, cyanosis, or edema MS- no significant deformity or atrophy Skin- no rash or lesion Psych- euthymic mood, full affect Neuro- strength and sensation are intact  EKG- NSR at 67 bpm, pr int 208 ms, qrs int 84 ms, qtc 432 ms Echo-Study Conclusions  - Procedure narrative: Transthoracic echocardiography. Image   quality was adequate. The study was technically difficult. - Left ventricle: The cavity size was normal. Systolic function was   normal. The estimated ejection fraction was in the range of 60%   to 65%. Wall motion was normal; there were no regional wall   motion abnormalities. The study is not technically sufficient to   allow evaluation of LV diastolic function. - Right atrium: The atrium was mildly dilated. - Atrial septum: Atrial septal aneurysm with marked bowing of the   mid and superior aspect of the septum to the right. No obvious   PFO   Bubble study not done.  Assessment and Plan: 1. Paroxysmal afib Unsuccessful cardioversion, 02/21/18, but loaded on Multaq and then with successful cardioversion,03/08/18 and remains in SR today Continue multaq 400 mg bid, not on rate control for slow v response in afib and soft BP's at times Pending physical labs with PCP next week Qtc stable at 437 ms Encouraged to follow  with Dr. Maxwell Caul for treatment of sleep apnea Recommended  alcohol intake to no more thatn 2 drinks a week  2. CHA2DS2VASc score of 1 Off anticoagulation per guidelines  F/u in afib clinic in 6 weeks   Butch Penny C. Jermain Curt, [facility] [address] [place], [place] [address] [phone]

## 2019-04-09 ENCOUNTER — Other Ambulatory Visit (HOSPITAL_COMMUNITY): Payer: Self-pay | Admitting: *Deleted

## 2019-04-09 ENCOUNTER — Other Ambulatory Visit: Payer: Self-pay | Admitting: Internal Medicine

## 2019-04-09 MED ORDER — MULTAQ 400 MG PO TABS: 400.0000 mg | ORAL_TABLET | Freq: Two times a day (BID) | ORAL | 6 refills | Status: DC

## 2019-08-06 ENCOUNTER — Other Ambulatory Visit: Payer: Self-pay

## 2019-08-06 ENCOUNTER — Encounter (HOSPITAL_COMMUNITY): Payer: Self-pay | Admitting: Nurse Practitioner

## 2019-08-06 ENCOUNTER — Ambulatory Visit (HOSPITAL_COMMUNITY)
Admission: RE | Admit: 2019-08-06 | Discharge: 2019-08-06 | Disposition: A | Discharge status: Home / Self Care | Payer: Commercial Managed Care - PPO | Source: Ambulatory Visit | Attending: Nurse Practitioner | Admitting: Nurse Practitioner

## 2019-08-06 VITALS — BP 110/68 | HR 81 | Ht 66.0 in | Wt 180.8 lb

## 2019-08-06 DIAGNOSIS — Z8249 Family history of ischemic heart disease and other diseases of the circulatory system: Secondary | ICD-10-CM | POA: Diagnosis not present

## 2019-08-06 DIAGNOSIS — Z885 Allergy status to narcotic agent status: Secondary | ICD-10-CM | POA: Insufficient documentation

## 2019-08-06 DIAGNOSIS — I4891 Unspecified atrial fibrillation: Secondary | ICD-10-CM | POA: Diagnosis present

## 2019-08-06 DIAGNOSIS — I480 Paroxysmal atrial fibrillation: Secondary | ICD-10-CM | POA: Insufficient documentation

## 2019-08-06 DIAGNOSIS — Z79899 Other long term (current) drug therapy: Secondary | ICD-10-CM | POA: Diagnosis not present

## 2019-08-06 DIAGNOSIS — J45909 Unspecified asthma, uncomplicated: Secondary | ICD-10-CM | POA: Diagnosis not present

## 2019-08-06 DIAGNOSIS — G4733 Obstructive sleep apnea (adult) (pediatric): Secondary | ICD-10-CM | POA: Insufficient documentation

## 2019-08-06 MED ORDER — MULTAQ 400 MG PO TABS: 400.0000 mg | ORAL_TABLET | Freq: Two times a day (BID) | ORAL | 6 refills | Status: DC

## 2019-08-06 NOTE — Progress Notes (Signed)
Primary Care Physician: [name], MD Referring Physician: Same EP: Dr. Lars Mage Debra Hardin is a 59 y.o. female with a h/o multiple thyroid nodules, mild OSA in the past, not using cpap, that was initially seen the afib clinic for evaluation after being found to have new onset afib at the time of colonoscopy . Pt did go on to  have the procedure without event. She continued in afib today, rate controlled. She reports that she has noted fluttering in her chest for around one year. She does notice palpitations, fatigue, no unusual shortness of breath. Was drinking some alcohol, minimal caffeine now, no tobacco,. H/o of mildly sleep apnea by sleep study 8 years ago, never really used CPAP. Admitted to a 10 lb weight gain over the last 6-9 months. She had a chadsvasc score of 1. 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  F/u afib clinic, 02/01/18. She remains for the most part in rate controlled afib, without rate control meds. BP has been running on the lower side so that would make use of rate control difficult.. She has been on anticoagulation for almost one week now , no issues with bleeding. Discussed the plan is still to purse cardioversion  after 3 weeks of uninterrupted anticoagulation. Echo reviewed with pt and does show aneurysm of the atrial septal wall without PFO noted. I had reviewed this with Dr. Aundra Dubin and he said no w/u was needed. Would only be significant if pt had had a stroke.   F/u in afib clinic, f/u cardioversion that was not successful despite being shocked x 3 times. Discussed options to restore SR and pt would prefer to try Multaq. No missed doses of eliquis. She had a cmet at MD's office recently with normal liver enzymes.  F/u 03/14/19. Cardioversion with multaq on board was successful and she remains in SR today. She however, does not feel that improved. Still has some fatigue but is happy to be  back in SR.  F/u in afib clinic,02/07/19. She is staying in [place] with Multaq and feels well. Fatigue greatly improved. She has had multiple sleep studies thru Dr. Maxwell Caul as she did test positive for sleep apnea but did not tolerate cpap. She went on to have an oral appliance made that controlled the apnea but adversely affected her bite.thru additional sleep studies it was found that if she wore a tennis ball t shirt and stayed on her side, she would not snore. So this is what she is doing currently.   F/u in afib clinic, 08/06/19. She reports no afib, Multaq still working well for her. Recently changed her job and has an adult daughter back home, both have been stressful for her. CHA2DS2VASc score remains 1.   Today, she denies symptoms of palpitations, chest pain, shortness of breath, orthopnea, PND, lower extremity edema, dizziness, presyncope, syncope, or neurologic sequela. The patient is tolerating medications without difficulties and is otherwise without complaint today.   Past Medical History:  Diagnosis Date  . Anxiety    Dr. [name]  . Asthma   . Bronchitis   . Depression   . HSV-2 (herpes simplex virus 2) infection   . Hyperlipidemia   . Insomnia   . Lateral epicondylitis right; 09/2014   Dr. [name]  . Multiple thyroid nodules 03/2014   more prominent on L>R; h/o benign bx. f/u 1 yr  . OSA (obstructive sleep apnea)  CPAP (uses sporadically)  . Trochanteric bursitis 01/2015  . Vaginal dryness    Past Surgical History:  Procedure Laterality Date  . CARDIOVERSION N/A 02/21/2018   Procedure: CARDIOVERSION;  Surgeon: Sueanne Margarita, MD;  Location: [facility] ENDOSCOPY;  Service: Cardiovascular;  Laterality: N/A;  . CARDIOVERSION N/A 03/08/2018   Procedure: CARDIOVERSION;  Surgeon: Buford Dresser, MD;  Location: [facility] ENDOSCOPY;  Service: Cardiovascular;  Laterality: N/A;  . [place] / OOPHORECTOMY / A&P REPAIR  2008   pt states she has her ovaries  .  CRYOTHERAPY    . DILATION AND CURETTAGE OF UTERUS    . HERNIA REPAIR  age 21   [facility]  . HYSTEROTOMY    . KNEE SURGERY Right    twice--Dr. Onnie Graham, Dr. Lynann Bologna (25 years ago)    Current Outpatient Medications  Medication Sig Dispense Refill  . albuterol (PROVENTIL HFA;VENTOLIN HFA) 108 (90 BASE) MCG/ACT inhaler Inhale 2 puffs into the lungs every 6 (six) hours as needed for wheezing or shortness of breath. 1 Inhaler 1  . cetirizine (ZYRTEC) 10 MG tablet Take 10 mg by mouth at bedtime.     . dronedarone (MULTAQ) 400 MG tablet Take 1 tablet (400 mg total) by mouth 2 (two) times daily with a meal. 60 tablet 6  . estradiol (VIVELLE-DOT) 0.05 MG/24HR patch PLACE ONTO SKIN AND CHANGE TWICE A WEEK 8 patch 4  . montelukast (SINGULAIR) 10 MG tablet Take 1 tablet (10 mg total) by mouth at bedtime. 90 tablet 1  . traZODone (DESYREL) 50 MG tablet Take by mouth.    . valACYclovir (VALTREX) 500 MG tablet Take one daily (Patient taking differently: Take 500 mg by mouth daily. ) 90 tablet 1   No current facility-administered medications for this encounter.    Allergies  Allergen Reactions  . Codeine Nausea And Vomiting    Social History   Socioeconomic History  . Marital status: Married    Spouse name: Not on file  . Number of children: Not on file  . Years of education: Not on file  . Highest education level: Not on file  Occupational History  . Not on file  Tobacco Use  . Smoking status: Never Smoker  . Smokeless tobacco: Never Used  Substance and Sexual Activity  . Alcohol use: Yes    Alcohol/week: 1.0 - 2.0 standard drinks    Types: 1 - 2 Glasses of wine per week    Comment: occasional (up to 4/week, not every week)  . Drug use: No  . Sexual activity: Yes    Birth control/protection: Surgical    Comment: hysterectomy   Other Topics Concern  . Not on file  Social History Narrative   Married.  2 daughters (one of whom has 2 rats).  Oldest is in college.   She works as a Social worker at  [employer]:   . Difficulty of Paying Living Expenses: Not on file  Food Insecurity:   . Worried About Charity fundraiser in the Last Year: Not on file  . Ran Out of Food in the Last Year: Not on file  Transportation Needs:   . Lack of Transportation (Medical): Not on file  . Lack of Transportation (Non-Medical): Not on file  Physical Activity:   . Days of Exercise per Week: Not on file  . Minutes of Exercise per Session: Not on file  Stress:   . Feeling of Stress : Not  on file  Social Connections:   . Frequency of Communication with Friends and Family: Not on file  . Frequency of Social Gatherings with Friends and Family: Not on file  . Attends Religious Services: Not on file  . Active Member of Clubs or Organizations: Not on file  . Attends Archivist Meetings: Not on file  . Marital Status: Not on file  Intimate Partner Violence:   . Fear of Current or Ex-Partner: Not on file  . Emotionally Abused: Not on file  . Physically Abused: Not on file  . Sexually Abused: Not on file    Family History  Problem Relation Age of Onset  . Bone cancer Paternal Grandmother   . Stroke Maternal Grandmother   . Heart attack Maternal Grandfather   . Stomach cancer Maternal Grandfather   . Heart attack Father 42       heart attacks at 7 and 45  . Alcohol abuse Father   . Hyperlipidemia Father   . Heart disease Mother   . Depression Mother   . Colon cancer Maternal Uncle   . Depression Sister   . Alcohol abuse Brother   . Hyperlipidemia Brother   . Anxiety disorder Brother   . Mental illness Daughter   . Alcohol abuse Sister   . Hyperlipidemia Sister   . Depression Sister   . Anxiety disorder Sister   . Diabetes Maternal Uncle     ROS- All systems are reviewed and negative except as per the HPI above  Physical Exam: Vitals:   08/06/19 0940  BP: 110/68  Pulse: 81  Weight: 82 kg  Height: 5\' 6"  (1.676  m)   Wt Readings from Last 3 Encounters:  08/06/19 82 kg  02/07/19 84.4 kg  08/07/18 81.7 kg    Labs: Lab Results  Component Value Date   NA 139 02/21/2018   K 4.5 02/21/2018   CL 106 02/21/2018   CO2 28 02/21/2018   GLUCOSE 91 02/21/2018   BUN 14 02/21/2018   CREATININE 0.96 02/21/2018   CALCIUM 8.9 02/21/2018   No results found for: INR Lab Results  Component Value Date   CHOL 168 10/30/2014   HDL 68 10/30/2014   LDLCALC 90 10/30/2014   TRIG 50 10/30/2014     GEN- The patient is well appearing, alert and oriented x 3 today.   Head- normocephalic, atraumatic Eyes-  Sclera clear, conjunctiva pink Ears- hearing intact Oropharynx- clear Neck- supple, no JVP Lymph- no cervical lymphadenopathy Lungs- Clear to ausculation bilaterally, normal work of breathing Heart- irregular rate and rhythm, no murmurs, rubs or gallops, PMI not laterally displaced GI- soft, NT, ND, + BS Extremities- no clubbing, cyanosis, or edema MS- no significant deformity or atrophy Skin- no rash or lesion Psych- euthymic mood, full affect Neuro- strength and sensation are intact  EKG- NSR at 81 bpm, pr int 196 ms, qrs int 84 ms, qtc 432 ms Echo-Study Conclusions  - Procedure narrative: Transthoracic echocardiography. Image   quality was adequate. The study was technically difficult. - Left ventricle: The cavity size was normal. Systolic function was   normal. The estimated ejection fraction was in the range of 60%   to 65%. Wall motion was normal; there were no regional wall   motion abnormalities. The study is not technically sufficient to   allow evaluation of LV diastolic function. - Right atrium: The atrium was mildly dilated. - Atrial septum: Atrial septal aneurysm with marked bowing of the   mid and  superior aspect of the septum to the right. No obvious   PFO   Bubble study not done.  Assessment and Plan: 1. Paroxysmal afib Unsuccessful cardioversion, 02/21/18, but loaded on  Multaq and then with successful cardioversion,03/08/18 and remains in SR today Continue multaq 400 mg bid, not on rate control for slow v response in afib and soft BP's at times  Qtc stable at 437 ms  2. CHA2DS2VASc score of 1 Off anticoagulation per guidelines  F/u in afib clinic in 6 months   Butch Penny C. Genese Quebedeaux, [facility] [address] [place], [place] [address] [phone]

## 2019-11-05 ENCOUNTER — Other Ambulatory Visit: Payer: Self-pay

## 2019-11-05 ENCOUNTER — Encounter: Payer: Self-pay | Admitting: Family Medicine

## 2019-11-05 ENCOUNTER — Ambulatory Visit (INDEPENDENT_AMBULATORY_CARE_PROVIDER_SITE_OTHER): Payer: Commercial Managed Care - PPO | Admitting: Family Medicine

## 2019-11-05 VITALS — BP 104/62 | Ht 66.0 in | Wt 170.0 lb

## 2019-11-05 DIAGNOSIS — M1711 Unilateral primary osteoarthritis, right knee: Secondary | ICD-10-CM | POA: Insufficient documentation

## 2019-11-05 NOTE — Patient Instructions (Signed)
Your pain is due to a flare of medial arthritis. These are the different medications you can take for this: Tylenol 500mg  1-2 tabs three times a day for pain. Voltaren gel, capsaicin, aspercreme, or biofreeze topically up to four times a day may also help with pain. Some supplements that may help for arthritis: Boswellia extract, curcumin, pycnogenol Aleve 1-2 tabs twice a day with food if needed Cortisone injections are an option. It's important that you continue to stay active. Straight leg raises, knee extensions 3 sets of 10 once a day (add ankle weight if these become too easy). Consider physical therapy to strengthen muscles around the joint that hurts to take pressure off of the joint itself. Shoe inserts with good arch support may be helpful. Heat or ice 15 minutes at a time 3-4 times a day as needed to help with pain. Water aerobics and cycling with low resistance are the best two types of exercise for arthritis though any exercise is ok as long as it doesn't worsen the pain. Follow up with me in 5-6 weeks.

## 2019-11-05 NOTE — Assessment & Plan Note (Signed)
Pain is likely due to OA flare. OA was evidenced on ultrasound with bone spurring.  Possibly posttraumatic versus primary.  -Topical diclofenac gel -Home exercises for quad strengthening -Follow-up as needed

## 2019-11-05 NOTE — Progress Notes (Signed)
    SUBJECTIVE:   CHIEF COMPLAINT / HPI:   Right knee pain Patient presents with 4 months of right knee pain.  She says that it started on February 5 when she was getting massage and had her knee hyperflexed.  Since then she has had swelling off and on in the right knee.  The pain will wake her up at night particularly when moving over.  It is also worse with unstable surfaces like walking on a wooded trail and stepping over roots.  Patient has not tried anything for the pain.  She says that NSAIDs do not work well for her.  She states that the pain is not that bad but that she wants to be evaluated before her insurance changes.  Patient states that she had a meniscal surgery and a ACL repair on one of her knees, but she cannot remember which one.  She states that this was 30 years ago.  PERTINENT  PMH / PSH:  Bilateral thumb arthritis to arthritis Shoulder arthritis  OBJECTIVE:   BP 104/62   Ht 5\' 6"  (1.676 m)   Wt 170 lb (77.1 kg)   BMI 27.44 kg/m   General: Well-appearing, no acute distress Skin: No rash, surgical on right knee Right knee Inspection: No swelling or contusion Palpation: Tender to palpation on the medial joint line.  No tenderness lateral joint line, post patellar facets. ROM: Full range of motion with extension and flexion of the knee Strength: 5 of 5 strength flexion and extension Stability: Joint is stable Special tests: Negative anterior and posterior drawer, negative Apley, negative Thessaly, negative Bounce.  Negative valgus and varus. Vascular studies:NVI  Limited MSK u/s right knee:  Spurring medially tibial and femoral aspects of joint.  No medial meniscus tear though two small calcifications noted within meniscus.    ASSESSMENT/PLAN:   Primary osteoarthritis of right knee Pain is likely due to OA flare. OA was evidenced on ultrasound with bone spurring.  Possibly posttraumatic versus primary.  -Topical diclofenac gel -Home exercises for quad  strengthening -Follow-up as needed  Bonnita Hollow, MD [place]

## 2019-12-27 ENCOUNTER — Ambulatory Visit (INDEPENDENT_AMBULATORY_CARE_PROVIDER_SITE_OTHER): Payer: Commercial Managed Care - PPO | Admitting: Sports Medicine

## 2019-12-27 ENCOUNTER — Other Ambulatory Visit: Payer: Self-pay

## 2019-12-27 ENCOUNTER — Encounter: Payer: Self-pay | Admitting: Sports Medicine

## 2019-12-27 VITALS — BP 102/68 | Ht 66.0 in | Wt 172.0 lb

## 2019-12-27 DIAGNOSIS — M1711 Unilateral primary osteoarthritis, right knee: Secondary | ICD-10-CM | POA: Diagnosis not present

## 2019-12-27 NOTE — Patient Instructions (Signed)
Pain in your right knee is caused by arthritis on the inner portion of the knee. -On the ultrasound we saw swelling in that area of the knee -To help with the swelling we would like you to wear compression sleeve with activity -Continue working on your strengthening exercises shown to you at your last visit -For exercise may also incorporate stationary biking to help strengthen the quads. -You may ice the knee as needed for swelling and take anti-inflammatories as needed for pain -We will get x-rays of the knee to better evaluate the degree of the arthritis

## 2019-12-27 NOTE — Progress Notes (Signed)
PCP: [name], MD  Subjective:   HPI: Patient is a 59 y.o. female here for follow-up of right knee pain.  Patient was seen 2 months ago for evaluation of her right knee pain.  She had a diagnostic ultrasound done at that time which showed some arthritic changes of her right knee.  She was given a home exercise program as well as instructions to use Voltaren gel for the pain.  Patient has a history of ACL tear which she believes is on the right knee.  Patient notes her pain has improved minimally since her last visit 2 months ago.  Pain is located mostly along the medial joint line and does not radiate.  She denies any swelling or recent injury.  She has no numbness or tingling in her knee.  Patient notes knee pain is present mostly with twisting motions of the knee.  She denies any mechanical symptoms.  She denies any instability of the knee.    Review of Systems: See HPI above.  Past Medical History:  Diagnosis Date  . Anxiety    Dr. [name]  . Asthma   . Bronchitis   . Depression   . HSV-2 (herpes simplex virus 2) infection   . Hyperlipidemia   . Insomnia   . Lateral epicondylitis right; 09/2014   Dr. [name]  . Multiple thyroid nodules 03/2014   more prominent on L>R; h/o benign bx. f/u 1 yr  . OSA (obstructive sleep apnea)    CPAP (uses sporadically)  . Trochanteric bursitis 01/2015  . Vaginal dryness     Current Outpatient Medications on File Prior to Visit  Medication Sig Dispense Refill  . albuterol (PROVENTIL HFA;VENTOLIN HFA) 108 (90 BASE) MCG/ACT inhaler Inhale 2 puffs into the lungs every 6 (six) hours as needed for wheezing or shortness of breath. 1 Inhaler 1  . cetirizine (ZYRTEC) 10 MG tablet Take 10 mg by mouth at bedtime.     . dronedarone (MULTAQ) 400 MG tablet Take 1 tablet (400 mg total) by mouth 2 (two) times daily with a meal. 60 tablet 6  . estradiol (VIVELLE-DOT) 0.05 MG/24HR patch PLACE ONTO SKIN AND CHANGE TWICE A WEEK 8 patch 4  .  montelukast (SINGULAIR) 10 MG tablet Take 1 tablet (10 mg total) by mouth at bedtime. 90 tablet 1  . traZODone (DESYREL) 50 MG tablet Take by mouth.    . valACYclovir (VALTREX) 500 MG tablet Take one daily (Patient taking differently: Take 500 mg by mouth daily. ) 90 tablet 1   No current facility-administered medications on file prior to visit.    Past Surgical History:  Procedure Laterality Date  . CARDIOVERSION N/A 02/21/2018   Procedure: CARDIOVERSION;  Surgeon: Sueanne Margarita, MD;  Location: [facility] ENDOSCOPY;  Service: Cardiovascular;  Laterality: N/A;  . CARDIOVERSION N/A 03/08/2018   Procedure: CARDIOVERSION;  Surgeon: Buford Dresser, MD;  Location: [facility] ENDOSCOPY;  Service: Cardiovascular;  Laterality: N/A;  . [place] / OOPHORECTOMY / A&P REPAIR  2008   pt states she has her ovaries  . CRYOTHERAPY    . DILATION AND CURETTAGE OF UTERUS    . HERNIA REPAIR  age 71   LIH  . HYSTEROTOMY    . KNEE SURGERY Right    twice--Dr. Onnie Graham, Dr. Lynann Bologna (25 years ago)    Allergies  Allergen Reactions  . Codeine Nausea And Vomiting    Social History   Socioeconomic History  . Marital status: Married    Spouse  name: Not on file  . Number of children: Not on file  . Years of education: Not on file  . Highest education level: Not on file  Occupational History  . Not on file  Tobacco Use  . Smoking status: Never Smoker  . Smokeless tobacco: Never Used  Vaping Use  . Vaping Use: Never used  Substance and Sexual Activity  . Alcohol use: Yes    Alcohol/week: 1.0 - 2.0 standard drink    Types: 1 - 2 Glasses of wine per week    Comment: occasional (up to 4/week, not every week)  . Drug use: No  . Sexual activity: Yes    Birth control/protection: Surgical    Comment: hysterectomy   Other Topics Concern  . Not on file  Social History Narrative   Married.  2 daughters (one of whom has 2 rats).  Oldest is in college.   She works as a Social worker at [employer]:   . Difficulty of Paying Living Expenses:   Food Insecurity:   . Worried About Charity fundraiser in the Last Year:   . Arboriculturist in the Last Year:   Transportation Needs:   . Film/video editor (Medical):   Marland Kitchen Lack of Transportation (Non-Medical):   Physical Activity:   . Days of Exercise per Week:   . Minutes of Exercise per Session:   Stress:   . Feeling of Stress :   Social Connections:   . Frequency of Communication with Friends and Family:   . Frequency of Social Gatherings with Friends and Family:   . Attends Religious Services:   . Active Member of Clubs or Organizations:   . Attends Archivist Meetings:   Marland Kitchen Marital Status:   Intimate Partner Violence:   . Fear of Current or Ex-Partner:   . Emotionally Abused:   Marland Kitchen Physically Abused:   . Sexually Abused:     Family History  Problem Relation Age of Onset  . Bone cancer Paternal Grandmother   . Stroke Maternal Grandmother   . Heart attack Maternal Grandfather   . Stomach cancer Maternal Grandfather   . Heart attack Father 42       heart attacks at 30 and 5  . Alcohol abuse Father   . Hyperlipidemia Father   . Heart disease Mother   . Depression Mother   . Colon cancer Maternal Uncle   . Depression Sister   . Alcohol abuse Brother   . Hyperlipidemia Brother   . Anxiety disorder Brother   . Mental illness Daughter   . Alcohol abuse Sister   . Hyperlipidemia Sister   . Depression Sister   . Anxiety disorder Sister   . Diabetes Maternal Uncle         Objective:  Physical Exam: BP 102/68   Ht 5\' 6"  (1.676 m)   Wt 172 lb (78 kg)   BMI 27.76 kg/m  Gen: NAD, comfortable in exam room Lungs: Breathing comfortably on room air Knee Exam Right -Inspection: no deformity, no discoloration -Palpation: Tenderness palpation along the medial joint line and along the pes anserine tendons -ROM: Extension: 0 degrees; Flexion: 130  degrees -Strength: Extension: 5/5; Flexion: 5/5 -Special Tests: Varus Stress: Negative; Valgus Stress: Negative; Lachman: Negative; Posterior drawer: Negative; McMurray: Negative -Limb neurovascularly intact, no instability noted  Limited diagnostic ultrasound of the right knee Findings: -No fluid noted within the suprapatellar pouch -  Normal appearance of the quadricep and patellar tendon -Calcification noted within the lateral meniscus.  No meniscal tears noted -Two calcifications noted within the medial meniscus.  There was moderate amount of swelling in the perimeniscal region along the medial joint line -Spurring noted along the medial femoral condyle -Normal appearance of the trochlear groove laterally with some narrowing medially -Small amount of fluid noted within the pes anserine bursa Impression: -Ultrasound consistent with arthritic changes of the knee and swelling along the medial meniscus  Ultrasound and interpretation by Dr. Sheppard Coil and Wolfgang Phoenix. Fields, MD     Assessment & Plan:  Patient is a 59 y.o. female here for valuation of right knee pain  1.  Right knee osteoarthritis -Patient given a compression sleeve to wear with activity -Patient may continue the home strengthening exercises shown to her at her last visit -Patient courage to start using stationary bike to help strengthen quadriceps -Patient may ice the knee and take anti-inflammatories as needed for pain -We will get x-rays to better evaluate the degree of arthritis.  Will call patient with results  Patient will follow up as needed  I observed and examined the patient with Dr. Sheppard Coil and agree with assessment and plan.  Note reviewed and modified by me. Ila Mcgill, MD

## 2019-12-31 ENCOUNTER — Ambulatory Visit
Admission: RE | Admit: 2019-12-31 | Discharge: 2019-12-31 | Disposition: A | Discharge status: Home / Self Care | Payer: Commercial Managed Care - PPO | Source: Ambulatory Visit | Attending: Sports Medicine | Admitting: Sports Medicine

## 2019-12-31 DIAGNOSIS — M1711 Unilateral primary osteoarthritis, right knee: Secondary | ICD-10-CM

## 2020-01-17 ENCOUNTER — Other Ambulatory Visit: Payer: Self-pay

## 2020-01-17 ENCOUNTER — Ambulatory Visit (INDEPENDENT_AMBULATORY_CARE_PROVIDER_SITE_OTHER): Payer: Commercial Managed Care - PPO | Admitting: Sports Medicine

## 2020-01-17 DIAGNOSIS — R269 Unspecified abnormalities of gait and mobility: Secondary | ICD-10-CM | POA: Diagnosis not present

## 2020-01-17 DIAGNOSIS — M1711 Unilateral primary osteoarthritis, right knee: Secondary | ICD-10-CM

## 2020-01-17 NOTE — Assessment & Plan Note (Signed)
Patient was fitted for a : standard, cushioned, semi-rigid orthotic. The orthotic was heated and afterward the patient stood on the orthotic blank positioned on the orthotic stand. The patient was positioned in subtalar neutral position and 10 degrees of ankle dorsiflexion in a weight bearing stance. After completion of molding, a stable base was applied to the orthotic blank. The blank was ground to a stable position for weight bearing. Size: 8 fast tech support black thin EVA Base: blue med. EVA Posting: none Additional orthotic padding: none  At completion she walked in neutral stance She felt good comfort

## 2020-01-17 NOTE — Assessment & Plan Note (Signed)
I encouraged more biking or stationary biking OA is mild  But needs better quad strength  OK to walk but don't push through too much knee pain

## 2020-01-17 NOTE — Progress Notes (Signed)
CC: chronic foot pain  Patient had chronic foot pain until put into orthoitics She found she can wear [employer] shoes I put her in custom orthotics first in 2017 Since then she can walk and exercise as long as she has orthotics Gets pain in short distance of walking without them  RT Knee Noted with some mild OA Exercises were helping Some soreness along medial side  PE Pleasant F in NAD BP 112/66   Ht 5\' 6"  (1.676 m)   Wt 171 lb (77.6 kg)   BMI 27.60 kg/m   Knee: Right Normal to inspection with no erythema or effusion or obvious bony abnormalities. Palpation normal with no warmth or joint line tenderness or patellar tenderness or condyle tenderness. ROM normal in flexion and extension and lower leg rotation. Ligaments with solid consistent endpoints including ACL, PCL, LCL, MCL. Negative Mcmurray's and provocative meniscal tests. Non painful patellar compression. Patellar and quadriceps tendons unremarkable. Hamstring and quadriceps strength is normal.  Only TTP is distal hamstring insertion to tibiq  Feet Pronated Loss of long arch Lateral column is rotated into excess supinaiton Loss of transverse arch

## 2020-01-28 ENCOUNTER — Ambulatory Visit (HOSPITAL_COMMUNITY)
Admission: RE | Admit: 2020-01-28 | Discharge: 2020-01-28 | Disposition: A | Discharge status: Home / Self Care | Payer: Commercial Managed Care - PPO | Source: Ambulatory Visit | Attending: Nurse Practitioner | Admitting: Nurse Practitioner

## 2020-01-28 ENCOUNTER — Other Ambulatory Visit: Payer: Self-pay

## 2020-01-28 ENCOUNTER — Encounter (HOSPITAL_COMMUNITY): Payer: Self-pay | Admitting: Nurse Practitioner

## 2020-01-28 VITALS — BP 116/66 | HR 61 | Ht 66.0 in | Wt 171.0 lb

## 2020-01-28 DIAGNOSIS — Z79899 Other long term (current) drug therapy: Secondary | ICD-10-CM | POA: Diagnosis not present

## 2020-01-28 DIAGNOSIS — E785 Hyperlipidemia, unspecified: Secondary | ICD-10-CM | POA: Diagnosis not present

## 2020-01-28 DIAGNOSIS — Z885 Allergy status to narcotic agent status: Secondary | ICD-10-CM | POA: Diagnosis not present

## 2020-01-28 DIAGNOSIS — G4700 Insomnia, unspecified: Secondary | ICD-10-CM | POA: Insufficient documentation

## 2020-01-28 DIAGNOSIS — Z8249 Family history of ischemic heart disease and other diseases of the circulatory system: Secondary | ICD-10-CM | POA: Diagnosis not present

## 2020-01-28 DIAGNOSIS — G4733 Obstructive sleep apnea (adult) (pediatric): Secondary | ICD-10-CM | POA: Diagnosis not present

## 2020-01-28 DIAGNOSIS — F419 Anxiety disorder, unspecified: Secondary | ICD-10-CM | POA: Insufficient documentation

## 2020-01-28 DIAGNOSIS — J45909 Unspecified asthma, uncomplicated: Secondary | ICD-10-CM | POA: Diagnosis not present

## 2020-01-28 DIAGNOSIS — I480 Paroxysmal atrial fibrillation: Secondary | ICD-10-CM | POA: Insufficient documentation

## 2020-01-28 DIAGNOSIS — F329 Major depressive disorder, single episode, unspecified: Secondary | ICD-10-CM | POA: Diagnosis not present

## 2020-01-28 DIAGNOSIS — Z8639 Personal history of other endocrine, nutritional and metabolic disease: Secondary | ICD-10-CM | POA: Insufficient documentation

## 2020-01-28 MED ORDER — DILTIAZEM HCL 30 MG PO TABS: ORAL_TABLET | ORAL | 1 refills | Status: DC

## 2020-01-28 NOTE — Progress Notes (Signed)
Primary Care Physician: [name], MD Referring Physician: Same EP: Dr. Lars Mage Debra Hardin is a 59 y.o. female with a h/o multiple thyroid nodules, mild OSA in the past, not using cpap, that was initially seen the afib clinic for evaluation after being found to have new onset afib at the time of colonoscopy . Pt did go on to  have the procedure without event. She continued in afib today, rate controlled. She reports that she has noted fluttering in her chest for around one year. She does notice palpitations, fatigue, no unusual shortness of breath. Was drinking some alcohol, minimal caffeine now, no tobacco,. H/o of mildly sleep apnea by sleep study 8 years ago, never really used CPAP. Admitted to a 10 lb weight gain over the last 6-9 months. She had a chadsvasc score of 1. 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  F/u afib clinic, 02/01/18. She remains for the most part in rate controlled afib, without rate control meds. BP has been running on the lower side so that would make use of rate control difficult.. She has been on anticoagulation for almost one week now , no issues with bleeding. Discussed the plan is still to purse cardioversion  after 3 weeks of uninterrupted anticoagulation. Echo reviewed with pt and does show aneurysm of the atrial septal wall without PFO noted. I had reviewed this with Dr. Aundra Dubin and he said no w/u was needed. Would only be significant if pt had had a stroke.   F/u in afib clinic, f/u cardioversion that was not successful despite being shocked x 3 times. Discussed options to restore SR and pt would prefer to try Multaq. No missed doses of eliquis. She had a cmet at MD's office recently with normal liver enzymes.  F/u 03/14/19. Cardioversion with multaq on board was successful and she remains in SR today. She however, does not feel that improved. Still has some fatigue but is happy to be  back in SR.  F/u in afib clinic,02/07/19. She is staying in [name] with Multaq and feels well. Fatigue greatly improved. She has had multiple sleep studies thru Dr. Maxwell Caul as she did test positive for sleep apnea but did not tolerate cpap. She went on to have an oral appliance made that controlled the apnea but adversely affected her bite.thru additional sleep studies it was found that if she wore a tennis ball t shirt and stayed on her side, she would not snore. So this is what she is doing currently.   F/u in afib clinic, 08/06/19. She reports no afib, Multaq still working well for her. Recently changed her job and has an adult daughter back home, both have been stressful for her. CHA2DS2VASc score remains 1.   F/u in afib clinic, 7/26, for one year f/u in afib clinic, for continued use of Multaq. She has not noted any afib but is planning to go  on vacation  soon to 2 different states and is wanting a back up plan if she should go into afib.    Today, she denies symptoms of palpitations, chest pain, shortness of breath, orthopnea, PND, lower extremity edema, dizziness, presyncope, syncope, or neurologic sequela. The patient is tolerating medications without difficulties and is otherwise without complaint today.   Past Medical History:  Diagnosis Date  . Anxiety    Dr. [name]  . Asthma   . Bronchitis   . Depression   .  HSV-2 (herpes simplex virus 2) infection   . Hyperlipidemia   . Insomnia   . Lateral epicondylitis right; 09/2014   Dr. [name]  . Multiple thyroid nodules 03/2014   more prominent on L>R; h/o benign bx. f/u 1 yr  . OSA (obstructive sleep apnea)    CPAP (uses sporadically)  . Trochanteric bursitis 01/2015  . Vaginal dryness    Past Surgical History:  Procedure Laterality Date  . CARDIOVERSION N/A 02/21/2018   Procedure: CARDIOVERSION;  Surgeon: Sueanne Margarita, MD;  Location: [facility] ENDOSCOPY;  Service: Cardiovascular;  Laterality: N/A;  . CARDIOVERSION N/A 03/08/2018    Procedure: CARDIOVERSION;  Surgeon: Buford Dresser, MD;  Location: [facility] ENDOSCOPY;  Service: Cardiovascular;  Laterality: N/A;  . [place] / OOPHORECTOMY / A&P REPAIR  2008   pt states she has her ovaries  . CRYOTHERAPY    . DILATION AND CURETTAGE OF UTERUS    . HERNIA REPAIR  age 87   LIH  . HYSTEROTOMY    . KNEE SURGERY Right    twice--Dr. Onnie Graham, Dr. Lynann Bologna (25 years ago)    Current Outpatient Medications  Medication Sig Dispense Refill  . albuterol (PROVENTIL HFA;VENTOLIN HFA) 108 (90 BASE) MCG/ACT inhaler Inhale 2 puffs into the lungs every 6 (six) hours as needed for wheezing or shortness of breath. 1 Inhaler 1  . cetirizine (ZYRTEC) 10 MG tablet Take 10 mg by mouth at bedtime.     . clonazePAM (KLONOPIN) 1 MG tablet Take 1 mg by mouth daily.    Marland Kitchen dronedarone (MULTAQ) 400 MG tablet Take 1 tablet (400 mg total) by mouth 2 (two) times daily with a meal. 60 tablet 6  . estradiol (VIVELLE-DOT) 0.05 MG/24HR patch PLACE ONTO SKIN AND CHANGE TWICE A WEEK 8 patch 4  . sertraline (ZOLOFT) 25 MG tablet Take 25 mg by mouth daily.    . traZODone (DESYREL) 50 MG tablet Take by mouth.    . valACYclovir (VALTREX) 500 MG tablet Take one daily (Patient taking differently: Take 500 mg by mouth daily. ) 90 tablet 1  . diltiazem (CARDIZEM) 30 MG tablet Take 1 tablet every 4 hours AS NEEDED for heart rate >100 as long as top blood pressure >100. 45 tablet 1   No current facility-administered medications for this encounter.    Allergies  Allergen Reactions  . Codeine Nausea And Vomiting    Social History   Socioeconomic History  . Marital status: Married    Spouse name: Not on file  . Number of children: Not on file  . Years of education: Not on file  . Highest education level: Not on file  Occupational History  . Not on file  Tobacco Use  . Smoking status: Never Smoker  . Smokeless tobacco: Never Used  Vaping Use  . Vaping Use: Never used  Substance and  Sexual Activity  . Alcohol use: Yes    Alcohol/week: 1.0 - 2.0 standard drink    Types: 1 - 2 Glasses of wine per week    Comment: occasional (up to 4/week, not every week)  . Drug use: No  . Sexual activity: Yes    Birth control/protection: Surgical    Comment: hysterectomy   Other Topics Concern  . Not on file  Social History Narrative   Married.  2 daughters (one of whom has 2 rats).  Oldest is in college.   She works as a Social worker at [employer]  Resource Strain:   . Difficulty of Paying Living Expenses:   Food Insecurity:   . Worried About Charity fundraiser in the Last Year:   . Arboriculturist in the Last Year:   Transportation Needs:   . Film/video editor (Medical):   Marland Kitchen Lack of Transportation (Non-Medical):   Physical Activity:   . Days of Exercise per Week:   . Minutes of Exercise per Session:   Stress:   . Feeling of Stress :   Social Connections:   . Frequency of Communication with Friends and Family:   . Frequency of Social Gatherings with Friends and Family:   . Attends Religious Services:   . Active Member of Clubs or Organizations:   . Attends Archivist Meetings:   Marland Kitchen Marital Status:   Intimate Partner Violence:   . Fear of Current or Ex-Partner:   . Emotionally Abused:   Marland Kitchen Physically Abused:   . Sexually Abused:     Family History  Problem Relation Age of Onset  . Bone cancer Paternal Grandmother   . Stroke Maternal Grandmother   . Heart attack Maternal Grandfather   . Stomach cancer Maternal Grandfather   . Heart attack Father 42       heart attacks at 52 and 60  . Alcohol abuse Father   . Hyperlipidemia Father   . Heart disease Mother   . Depression Mother   . Colon cancer Maternal Uncle   . Depression Sister   . Alcohol abuse Brother   . Hyperlipidemia Brother   . Anxiety disorder Brother   . Mental illness Daughter   . Alcohol abuse Sister   . Hyperlipidemia Sister   .  Depression Sister   . Anxiety disorder Sister   . Diabetes Maternal Uncle     ROS- All systems are reviewed and negative except as per the HPI above  Physical Exam: Vitals:   01/28/20 1347  BP: 116/66  Pulse: 61  Weight: 77.6 kg  Height: 5\' 6"  (1.676 m)   Wt Readings from Last 3 Encounters:  01/28/20 77.6 kg  01/17/20 77.6 kg  12/27/19 78 kg    Labs: Lab Results  Component Value Date   NA 139 02/21/2018   K 4.5 02/21/2018   CL 106 02/21/2018   CO2 28 02/21/2018   GLUCOSE 91 02/21/2018   BUN 14 02/21/2018   CREATININE 0.96 02/21/2018   CALCIUM 8.9 02/21/2018   No results found for: INR Lab Results  Component Value Date   CHOL 168 10/30/2014   HDL 68 10/30/2014   LDLCALC 90 10/30/2014   TRIG 50 10/30/2014     GEN- The patient is well appearing, alert and oriented x 3 today.   Head- normocephalic, atraumatic Eyes-  Sclera clear, conjunctiva pink Ears- hearing intact Oropharynx- clear Neck- supple, no JVP Lymph- no cervical lymphadenopathy Lungs- Clear to ausculation bilaterally, normal work of breathing Heart- regular rate and rhythm, no murmurs, rubs or gallops, PMI not laterally displaced GI- soft, NT, ND, + BS Extremities- no clubbing, cyanosis, or edema MS- no significant deformity or atrophy Skin- no rash or lesion Psych- euthymic mood, full affect Neuro- strength and sensation are intact  EKG- NSR at 61 bpm, pr int 206 ms, qrs int 82 ms, qtc 418 ms Echo-Study Conclusions  - Procedure narrative: Transthoracic echocardiography. Image   quality was adequate. The study was technically difficult. - Left ventricle: The cavity size was normal. Systolic function was   normal.  The estimated ejection fraction was in the range of 60%   to 65%. Wall motion was normal; there were no regional wall   motion abnormalities. The study is not technically sufficient to   allow evaluation of LV diastolic function. - Right atrium: The atrium was mildly dilated. -  Atrial septum: Atrial septal aneurysm with marked bowing of the   mid and superior aspect of the septum to the right. No obvious   PFO   Bubble study not done.  Assessment and Plan: 1. Paroxysmal afib Unsuccessful cardioversion, 02/21/18, but loaded on Multaq and then with successful cardioversion,03/08/18 and remains in SR today Continue multaq 400 mg bid, not on daily rate control for slow v response in afib and soft BP's at times  Qtc stable at 418 ms  2. CHA2DS2VASc score of 1 Off anticoagulation per guidelines  3. Plan for paroxysmal  afib during vacation  Will rx 30 mg cardizem to use for any breakthrough afib if HR is over 100 bpm, and sys BP is over 100 I also gave her a sample box of eliquis to use if out of state and afib is persistent over 24 hours  She can always call here  for guidance if needed   F/u in one year   Butch Penny C. Aarya Robinson, [facility] [address] [place], [place] [address] [phone]

## 2020-02-04 ENCOUNTER — Ambulatory Visit (HOSPITAL_COMMUNITY): Payer: Commercial Managed Care - PPO | Admitting: Nurse Practitioner

## 2020-03-01 IMAGING — CR DG HIP (WITH OR WITHOUT PELVIS) 2-3V*L*
2 series · 2 of 2 positions shown · non-contrast
Comparison: None.

CLINICAL DATA: Fall 2 months ago. Persistent left hip pain. Initial
encounter.

EXAM:
DG HIP (WITH OR WITHOUT PELVIS) 2-3V LEFT

[t hip ap left]
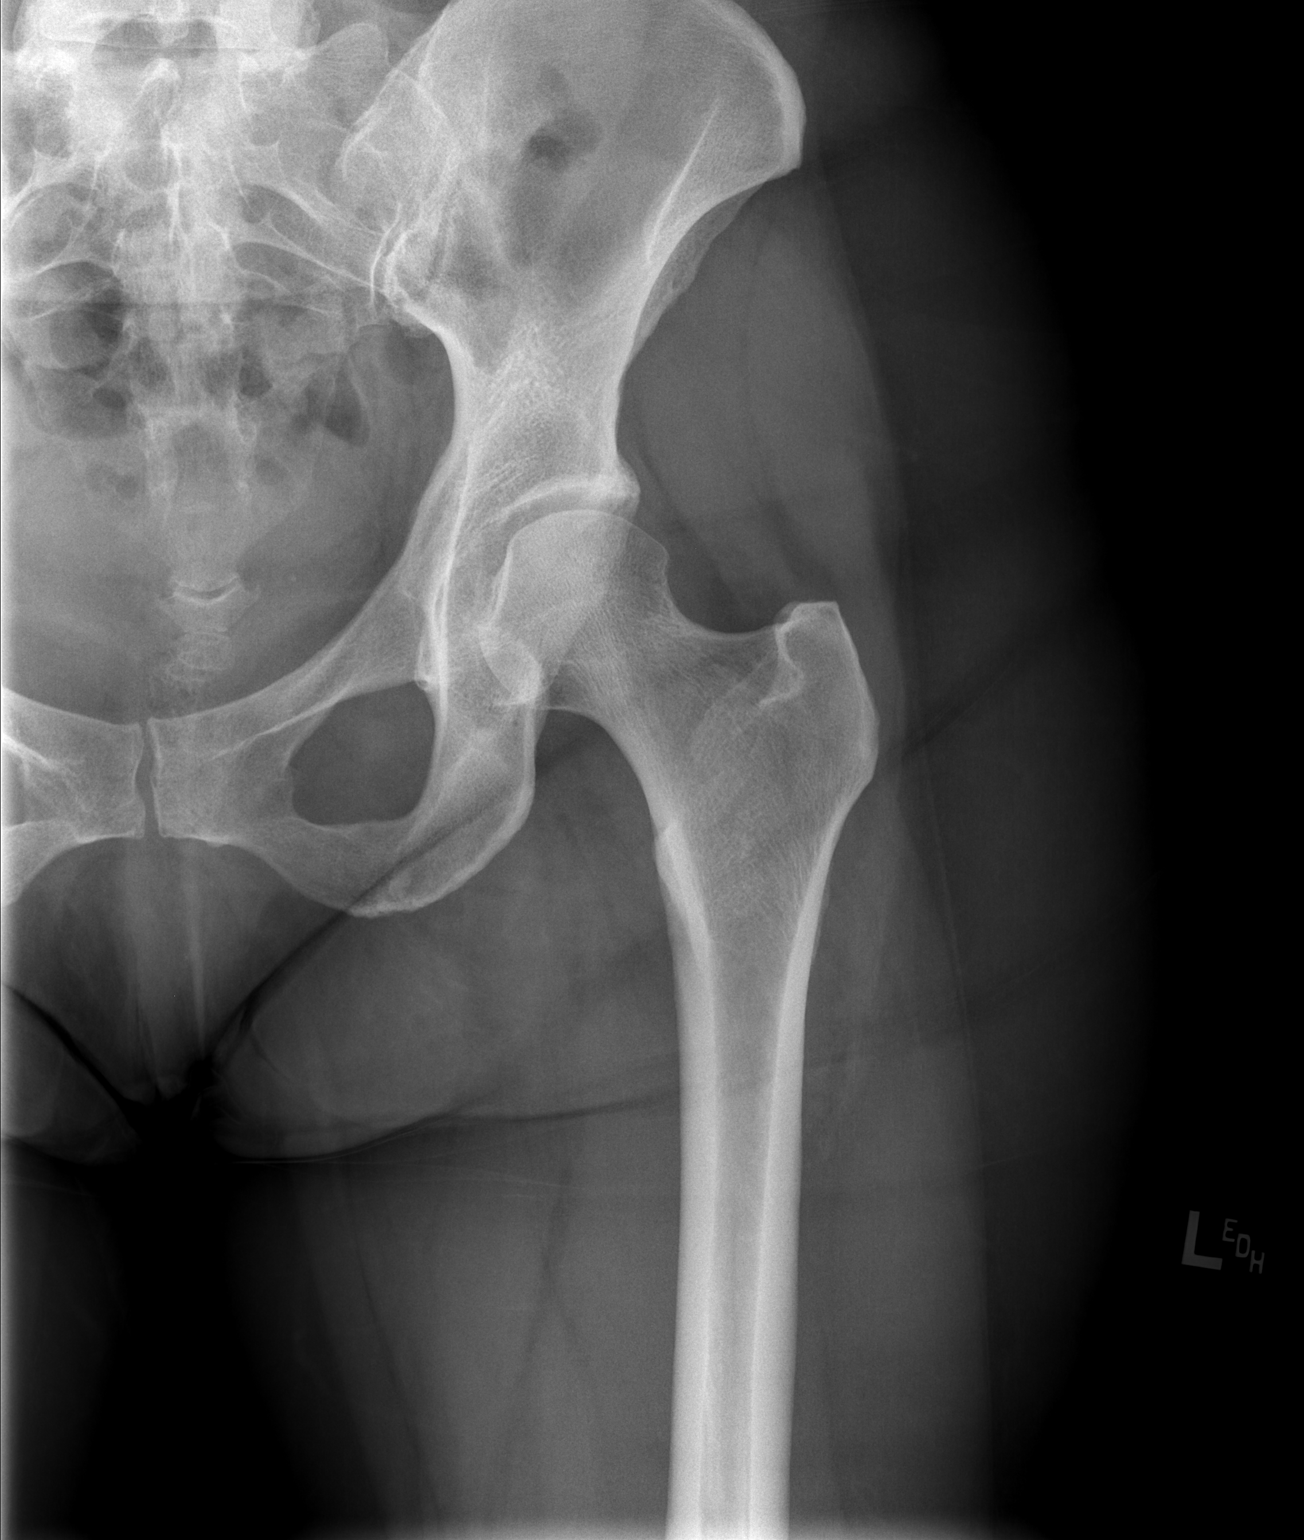

[t hip frog leg left]
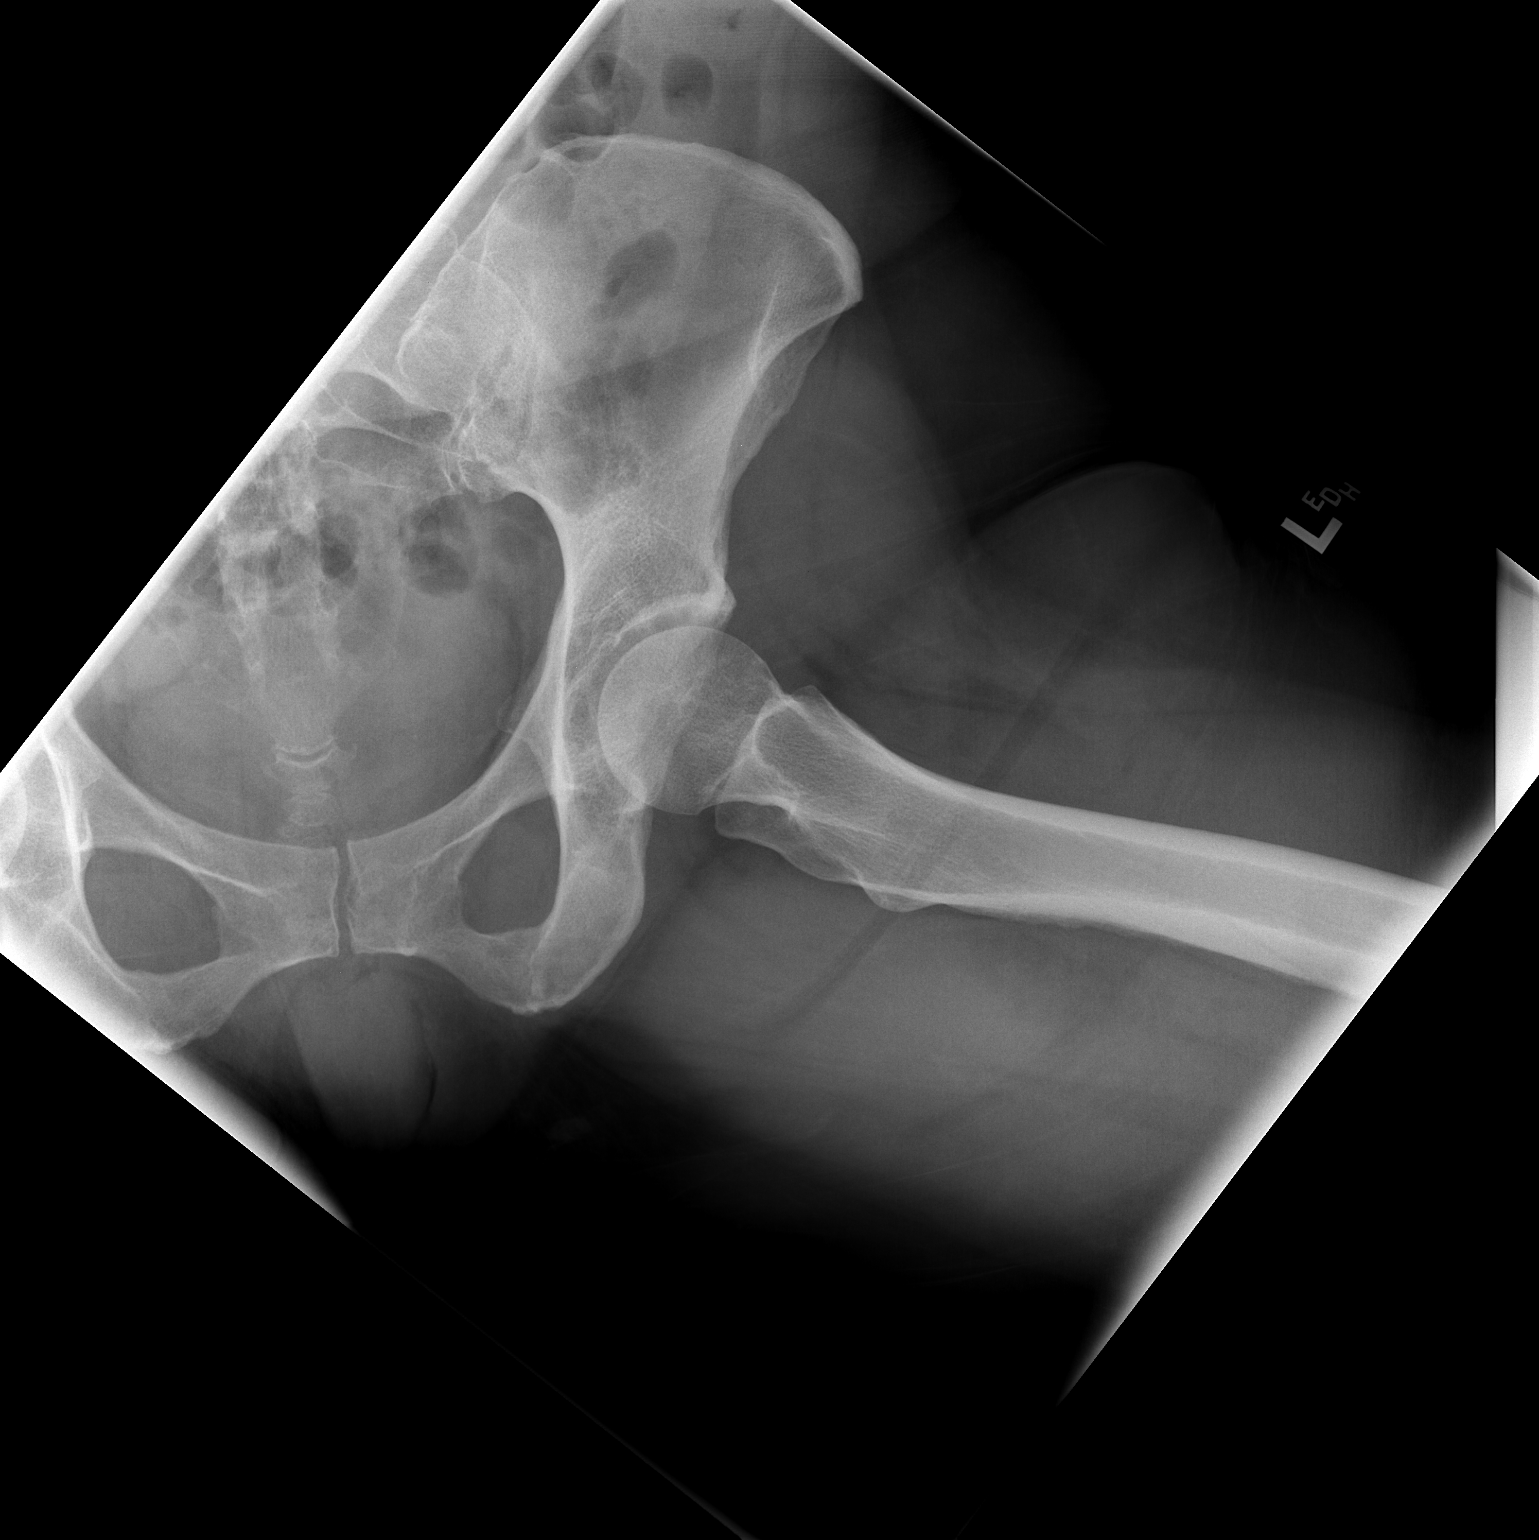

[2 of 2 positions shown; findings below may reference images not displayed]

FINDINGS: There is no evidence of hip fracture or dislocation. There is no
evidence of arthropathy or other focal bone abnormality.
IMPRESSION: Negative.

## 2020-04-30 ENCOUNTER — Other Ambulatory Visit: Payer: Self-pay | Admitting: Internal Medicine

## 2020-05-01 NOTE — Telephone Encounter (Signed)
This is a A-Fib clinic pt 

## 2020-07-09 DIAGNOSIS — F4321 Adjustment disorder with depressed mood: Secondary | ICD-10-CM | POA: Diagnosis not present

## 2020-07-17 DIAGNOSIS — L578 Other skin changes due to chronic exposure to nonionizing radiation: Secondary | ICD-10-CM | POA: Diagnosis not present

## 2020-07-17 DIAGNOSIS — L821 Other seborrheic keratosis: Secondary | ICD-10-CM | POA: Diagnosis not present

## 2020-07-17 DIAGNOSIS — S20412A Abrasion of left back wall of thorax, initial encounter: Secondary | ICD-10-CM | POA: Diagnosis not present

## 2020-07-17 DIAGNOSIS — D225 Melanocytic nevi of trunk: Secondary | ICD-10-CM | POA: Diagnosis not present

## 2020-07-23 DIAGNOSIS — F4321 Adjustment disorder with depressed mood: Secondary | ICD-10-CM | POA: Diagnosis not present

## 2020-08-07 DIAGNOSIS — F4321 Adjustment disorder with depressed mood: Secondary | ICD-10-CM | POA: Diagnosis not present

## 2020-08-21 ENCOUNTER — Ambulatory Visit: Payer: Self-pay | Admitting: Sports Medicine

## 2020-08-27 DIAGNOSIS — F4321 Adjustment disorder with depressed mood: Secondary | ICD-10-CM | POA: Diagnosis not present

## 2020-08-28 ENCOUNTER — Ambulatory Visit: Payer: Self-pay | Admitting: Sports Medicine

## 2020-09-02 ENCOUNTER — Encounter: Payer: Self-pay | Admitting: Sports Medicine

## 2020-09-02 ENCOUNTER — Ambulatory Visit: Payer: BLUE CROSS/BLUE SHIELD | Admitting: Sports Medicine

## 2020-09-02 ENCOUNTER — Other Ambulatory Visit: Payer: Self-pay

## 2020-09-02 VITALS — BP 122/78 | Ht 66.0 in | Wt 180.0 lb

## 2020-09-02 DIAGNOSIS — R269 Unspecified abnormalities of gait and mobility: Secondary | ICD-10-CM

## 2020-09-02 DIAGNOSIS — M79672 Pain in left foot: Secondary | ICD-10-CM

## 2020-09-02 NOTE — Progress Notes (Signed)
   PCP: [name], MD  Subjective:   HPI: Patient is a 60 y.o. female with known history of chronic foot pain here for reevaluation of left foot pain.  She reports that since her last visit when she was constructed a custom orthotic, she has had worsening of her first MTP and second MTP pain.  She especially has pain with dorsiflexion of the first MTP joint.  She also notes pain on the plantar aspect over her second metatarsal head especially with prolonged walking.  No new trauma or injury.  She is wondering if something can be altered with her orthotics to help with this.  Review of Systems:  Per HPI.   [place], medications and smoking status reviewed.      Objective:  Physical Exam:  No flowsheet data found.  BP 122/78   Ht 5\' 6"  (1.676 m)   Wt 180 lb (81.6 kg)   BMI 29.05 kg/m   Gen: awake, alert, NAD, comfortable in exam room Pulm: breathing unlabored  Left foot: Inspection: Bony enlargement at the first MTP and first TMT joint.  She also has callus overlying the second metatarsal head on the plantar surface.  With standing, patient has collapse of the longitudinal and transverse arch and congenitally short first ray.   Palpation: Mild tenderness to palpation over the first MTP and the second metatarsal head ROM: Decreased range of motion at first MTP joint, about 20 to 25 degrees of dorsiflexion and 5 degrees of plantarflexion. Strength: 5/5 strength ankle in all planes Neurovascular: N/V intact distally in the lower extremity    Assessment & Plan:  1.  Hallux rigidus of left foot 2.  Second metatarsal metatarsalgia 3.  Congenitally short first ray  Patient with multiple abnormalities secondary to her congenitally short first ray providing extra pressure on her second metatarsal head and excess motion at the MTP joint.  Given this, a cushion was added to the distal aspect of her orthotitcs to support her fore foot.  Hopefully this will help provide extra cushion  and not worsen the rigidity.  After placement the patient walked in the orthotic and had better support with less forefoot pain  Dagoberto Ligas, MD [facility] Fellow 09/02/2020 12:10 PM I observed and examined the patient with the [facility] resident and agree with assessment and plan.  Note reviewed and modified by me. Ila Mcgill, MD

## 2020-09-17 DIAGNOSIS — H401131 Primary open-angle glaucoma, bilateral, mild stage: Secondary | ICD-10-CM | POA: Diagnosis not present

## 2020-09-25 DIAGNOSIS — E2839 Other primary ovarian failure: Secondary | ICD-10-CM | POA: Diagnosis not present

## 2020-09-25 DIAGNOSIS — E042 Nontoxic multinodular goiter: Secondary | ICD-10-CM | POA: Diagnosis not present

## 2020-09-25 DIAGNOSIS — I4819 Other persistent atrial fibrillation: Secondary | ICD-10-CM | POA: Diagnosis not present

## 2020-09-25 DIAGNOSIS — G4733 Obstructive sleep apnea (adult) (pediatric): Secondary | ICD-10-CM | POA: Diagnosis not present

## 2020-09-25 DIAGNOSIS — J4520 Mild intermittent asthma, uncomplicated: Secondary | ICD-10-CM | POA: Diagnosis not present

## 2020-10-08 DIAGNOSIS — F4321 Adjustment disorder with depressed mood: Secondary | ICD-10-CM | POA: Diagnosis not present

## 2020-11-06 DIAGNOSIS — H401131 Primary open-angle glaucoma, bilateral, mild stage: Secondary | ICD-10-CM | POA: Diagnosis not present

## 2020-11-11 DIAGNOSIS — F4321 Adjustment disorder with depressed mood: Secondary | ICD-10-CM | POA: Diagnosis not present

## 2020-11-28 DIAGNOSIS — F4321 Adjustment disorder with depressed mood: Secondary | ICD-10-CM | POA: Diagnosis not present

## 2020-12-03 ENCOUNTER — Ambulatory Visit
Admission: RE | Admit: 2020-12-03 | Discharge: 2020-12-03 | Disposition: A | Discharge status: Home / Self Care | Payer: BC Managed Care – PPO | Source: Ambulatory Visit | Attending: Family Medicine | Admitting: Family Medicine

## 2020-12-03 ENCOUNTER — Ambulatory Visit (INDEPENDENT_AMBULATORY_CARE_PROVIDER_SITE_OTHER): Payer: BC Managed Care – PPO | Admitting: Family Medicine

## 2020-12-03 ENCOUNTER — Other Ambulatory Visit: Payer: Self-pay

## 2020-12-03 VITALS — BP 104/62 | Ht 66.0 in | Wt 178.0 lb

## 2020-12-03 DIAGNOSIS — M19071 Primary osteoarthritis, right ankle and foot: Secondary | ICD-10-CM | POA: Diagnosis not present

## 2020-12-03 DIAGNOSIS — M25571 Pain in right ankle and joints of right foot: Secondary | ICD-10-CM

## 2020-12-03 DIAGNOSIS — M79671 Pain in right foot: Secondary | ICD-10-CM

## 2020-12-03 DIAGNOSIS — S99921A Unspecified injury of right foot, initial encounter: Secondary | ICD-10-CM | POA: Diagnosis not present

## 2020-12-03 NOTE — Progress Notes (Signed)
    SUBJECTIVE:   CHIEF COMPLAINT / HPI:   Right Ankle Pain Debra Hardin is a very pleasant 60y/o female who presents today with right ankle pain after suffering a misstep 9 days ago. She went to step down and lost her footing and her ankle rolled in. She had pain, bruising, and lots of swelling. The pain is located on the outside of her foot. Overall, since the injury her pain, swelling, and bruising have improved. It sill hurts to walk on it a little bit. She used ice and elevation mostly to treat it. She has a beach trip coming up and she is concerned because it keeps swelling if she did more damage than just a sprain.  PERTINENT  PMH / PSH: OSA, Asthma  OBJECTIVE:   BP 104/62   Ht 5\' 6"  (1.676 m)   Wt 178 lb (80.7 kg)   BMI 28.73 kg/m   No flowsheet data found.  Ankle/Foot, Right: No visible erythema, mild swelling and ecchymosis, but no bony deformity. Range of motion is full in all directions. Strength is 5/5 in all directions. No tenderness at the Achilles tendon; No peroneal tendon tenderness. Pain with ankle inversion. Point of maximal tenderness is over lateral cuneiform. Able to walk 4 steps.  Talar tilt positive, negative anterior drawer test.   ASSESSMENT/PLAN:   Right ankle pain Continued foot and ankle pain with swelling 9 days after ankle inversion. Most of her pain is over the lateral midfoot. She has point tenderness over lateral cuneiform.  - 3 view x-ray of the foot to rule out fracture - Short boot for comfort - Continue with ABC's ROM exercises - I will call her with x-ray results; if no fracture use boot as needed and come out of it in 3-5 days and progress with exercises and I will follow up with her 2 weeks.     Nuala Alpha, DO PGY-4, Sports Medicine Fellow [place]  Addendum:  I was the preceptor for this visit and available for immediate consultation.  [name] MD Kirt Boys

## 2020-12-03 NOTE — Patient Instructions (Signed)
It was great to meet you today! Thank you for letting me participate in your care!  Today, we discussed your right ankle. I suspect a sprain but I will order an x-ray to rule out a fracture. I will call you with results. Until then stay in the boot and come out of it once per day to due range of motion exercises.  Be well, Debra Rutherford, DO PGY-4, Sports Medicine Fellow [place]

## 2020-12-03 NOTE — Assessment & Plan Note (Signed)
Continued foot and ankle pain with swelling 9 days after ankle inversion. Most of her pain is over the lateral midfoot. She has point tenderness over lateral cuneiform.  - 3 view x-ray of the foot to rule out fracture - Short boot for comfort - Continue with ABC's ROM exercises - I will call her with x-ray results; if no fracture use boot as needed and come out of it in 3-5 days and progress with exercises and I will follow up with her 2 weeks.

## 2020-12-23 ENCOUNTER — Other Ambulatory Visit: Payer: Self-pay

## 2020-12-23 ENCOUNTER — Ambulatory Visit: Payer: BC Managed Care – PPO | Admitting: Sports Medicine

## 2020-12-23 ENCOUNTER — Encounter: Payer: Self-pay | Admitting: Sports Medicine

## 2020-12-23 DIAGNOSIS — M25571 Pain in right ankle and joints of right foot: Secondary | ICD-10-CM | POA: Diagnosis not present

## 2020-12-23 NOTE — Progress Notes (Signed)
   PCP: [name], MD  Subjective:   HPI: Patient is a 60 y.o. female here for follow-up on right ankle sprain.  Initial injury in late May, she was seen here 12/03/2020, at that time was diagnosed with likely ankle sprain.  X-rays were obtained and did not show any signs of fracture.  She was placed in a short leg walking boot with plan to transition out as tolerated and start early range of motion.  Since then, patient reports significant improvement in her pain.  She continues have some lateral foot pain and posterior lateral malleolar pain.  No numbness or tingling, no new trauma or injury.   Review of Systems:  Per HPI.   [place], medications and smoking status reviewed.      Objective:  Physical Exam: BP 110/64   Ht 5\' 6"  (1.676 m)   Wt 176 lb (79.8 kg)   BMI 28.41 kg/m   No flowsheet data found.   Gen: awake, alert, NAD, comfortable in exam room Pulm: breathing unlabored  Right ankle:  Inspection: No evidence of erythema, ecchymosis, swelling, edema.  Passive ROM: Intact and non-painful to plantar/dorsiflexion,inversion/eversion. No pain with flexion/extension great toe  Strength: 5/5 strength without pain to resisted plantarflexion/dorsiflexion, inversion, eversion.  Mild pain with resisted eversion. Medial/lateral malleolus: Mildly tender over base 5th and just proximal to this Metatarsal: Nontender  Navicular: Nontender  ATFL, CFL, PTFL: Some tenderness over ATFL, nontender over CFL or PT FL Mortise/tib-talar joint: nontender Deltoid ligament: nontender Anterior drawer: Very minimal laxity compared to contralateral side Talar/reverse talar tilt: No laxity or pain    Assessment & Plan:  1.  Right lateral ankle sprain/peroneal tendon strain Patient is recovering well.  We discussed that we can progress her activities and therapy exercises.  Plan: -HEP with band exercises for foot strengthening and balance exercises for proprioception -Lace up ASO given  today to be used during hiking and other activities at risk of reinjuring -Follow-up as needed   Dagoberto Ligas, MD [place] Fellow 12/23/2020 10:30 AM  I observed and examined the patient with the [facility] resident and agree with assessment and plan.  Note reviewed and modified by me. Ila Mcgill, MD

## 2020-12-24 DIAGNOSIS — F4321 Adjustment disorder with depressed mood: Secondary | ICD-10-CM | POA: Diagnosis not present

## 2021-01-07 ENCOUNTER — Ambulatory Visit (HOSPITAL_COMMUNITY): Payer: BC Managed Care – PPO | Admitting: Nurse Practitioner

## 2021-01-09 DIAGNOSIS — R42 Dizziness and giddiness: Secondary | ICD-10-CM | POA: Diagnosis not present

## 2021-01-09 DIAGNOSIS — J309 Allergic rhinitis, unspecified: Secondary | ICD-10-CM | POA: Diagnosis not present

## 2021-01-16 ENCOUNTER — Other Ambulatory Visit: Payer: Self-pay | Admitting: Nurse Practitioner

## 2021-02-16 ENCOUNTER — Other Ambulatory Visit: Payer: Self-pay

## 2021-02-16 ENCOUNTER — Ambulatory Visit (HOSPITAL_COMMUNITY)
Admission: RE | Admit: 2021-02-16 | Discharge: 2021-02-16 | Disposition: A | Discharge status: Home / Self Care | Payer: BC Managed Care – PPO | Source: Ambulatory Visit | Attending: Nurse Practitioner | Admitting: Nurse Practitioner

## 2021-02-16 ENCOUNTER — Encounter (HOSPITAL_COMMUNITY): Payer: Self-pay | Admitting: Nurse Practitioner

## 2021-02-16 VITALS — BP 122/70 | HR 59 | Ht 66.0 in | Wt 184.4 lb

## 2021-02-16 DIAGNOSIS — Z8249 Family history of ischemic heart disease and other diseases of the circulatory system: Secondary | ICD-10-CM | POA: Diagnosis not present

## 2021-02-16 DIAGNOSIS — Z79899 Other long term (current) drug therapy: Secondary | ICD-10-CM | POA: Diagnosis not present

## 2021-02-16 DIAGNOSIS — D6869 Other thrombophilia: Secondary | ICD-10-CM

## 2021-02-16 DIAGNOSIS — I480 Paroxysmal atrial fibrillation: Secondary | ICD-10-CM | POA: Diagnosis not present

## 2021-02-16 MED ORDER — MULTAQ 400 MG PO TABS: 400.0000 mg | ORAL_TABLET | Freq: Two times a day (BID) | ORAL | 12 refills | Status: DC

## 2021-02-16 NOTE — Progress Notes (Signed)
Primary Care Physician: [name], MD Referring Physician: Same EP: Dr. Lars Mage Debra Hardin is a 60 y.o. female with a h/o multiple thyroid nodules, mild OSA in the past, not using cpap, that was initially seen the afib clinic for evaluation after being found to have new onset afib at the time of colonoscopy . Pt did go on to  have the procedure without event. She continued in afib today, rate controlled. She reports that she has noted fluttering in her chest for around one year. She does notice palpitations, fatigue, no unusual shortness of breath. Was drinking some alcohol, minimal caffeine now, no tobacco,. H/o of mildly sleep apnea by sleep study 8 years ago, never really used CPAP. Admitted to a 10 lb weight gain over the last 6-9 months. She had a chadsvasc score of 1. 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  F/u afib clinic, 02/01/18. She remains for the most part in rate controlled afib, without rate control meds. BP has been running on the lower side so that would make use of rate control difficult.. She has been on anticoagulation for almost one week now , no issues with bleeding. Discussed the plan is still to purse cardioversion  after 3 weeks of uninterrupted anticoagulation. Echo reviewed with pt and does show aneurysm of the atrial septal wall without PFO noted. I had reviewed this with Dr. Aundra Dubin and he said no w/u was needed. Would only be significant if pt had had a stroke.   F/u in afib clinic, f/u cardioversion that was not successful despite being shocked x 3 times. Discussed options to restore SR and pt would prefer to try Multaq. No missed doses of eliquis. She had a cmet at MD's office recently with normal liver enzymes.  F/u 03/14/19. Cardioversion with multaq on board was successful and she remains in SR today. She however, does not feel that improved. Still has some fatigue but is happy to be  back in SR.  F/u in afib clinic,02/07/19. She is staying in [place] with Multaq and feels well. Fatigue greatly improved. She has had multiple sleep studies thru Dr. Maxwell Caul as she did test positive for sleep apnea but did not tolerate cpap. She went on to have an oral appliance made that controlled the apnea but adversely affected her bite.thru additional sleep studies it was found that if she wore a tennis ball t shirt and stayed on her side, she would not snore. So this is what she is doing currently.   F/u in afib clinic, 08/06/19. She reports no afib, Multaq still working well for her. Recently changed her job and has an adult daughter back home, both have been stressful for her. CHA2DS2VASc score remains 1.   F/u in afib clinic, 7/26, for one year f/u in afib clinic, for continued use of Multaq. She has not noted any afib but is planning to go  on vacation  soon to 2 different states and is wanting a back up plan if she should go into afib.   F/u in afib clinic, 02/16/21. She continues on Multaq 400 mg bid, no afib to report. She is not on anticoagulation with a CHA2DS2VASc  score of 1.   Today, she denies symptoms of palpitations, chest pain, shortness of breath, orthopnea, PND, lower extremity edema, dizziness, presyncope, syncope, or neurologic sequela. The patient is tolerating medications without difficulties and is otherwise without complaint  today.   Past Medical History:  Diagnosis Date   Anxiety    Dr. Theadore Nan Braden   Asthma    Bronchitis    Depression    HSV-2 (herpes simplex virus 2) infection    Hyperlipidemia    Insomnia    Lateral epicondylitis right; 09/2014   Dr. [name]   Multiple thyroid nodules 03/2014   more prominent on L>R; h/o benign bx. f/u 1 yr   OSA (obstructive sleep apnea)    CPAP (uses sporadically)   Trochanteric bursitis 01/2015   Vaginal dryness    Past Surgical History:  Procedure Laterality Date   CARDIOVERSION N/A 02/21/2018   Procedure: CARDIOVERSION;   Surgeon: Sueanne Margarita, MD;  Location: [facility];  Service: Cardiovascular;  Laterality: N/A;   CARDIOVERSION N/A 03/08/2018   Procedure: CARDIOVERSION;  Surgeon: Buford Dresser, MD;  Location: [facility];  Service: Cardiovascular;  Laterality: N/A;   COMBINED HYSTERECTOMY VAGINAL / OOPHORECTOMY / A&P REPAIR  2008   pt states she has her ovaries   CRYOTHERAPY     DILATION AND CURETTAGE OF UTERUS     HERNIA REPAIR  age 29   Marty Right    twice--Dr. Onnie Graham, Dr. Lynann Bologna (25 years ago)    Current Outpatient Medications  Medication Sig Dispense Refill   albuterol (PROVENTIL HFA;VENTOLIN HFA) 108 (90 BASE) MCG/ACT inhaler Inhale 2 puffs into the lungs every 6 (six) hours as needed for wheezing or shortness of breath. 1 Inhaler 1   cetirizine (ZYRTEC) 10 MG tablet Take 10 mg by mouth at bedtime.      diltiazem (CARDIZEM) 30 MG tablet Take 1 tablet every 4 hours AS NEEDED for heart rate >100 as long as top blood pressure >100. 45 tablet 1   dronedarone (MULTAQ) 400 MG tablet Take 1 tablet (400 mg total) by mouth 2 (two) times daily with a meal. Appointment Required For Further Refills [phone] 60 tablet 0   estradiol (VIVELLE-DOT) 0.05 MG/24HR patch PLACE ONTO SKIN AND CHANGE TWICE A WEEK 8 patch 4   latanoprost (XALATAN) 0.005 % ophthalmic solution SMARTSIG:In Eye(s)     montelukast (SINGULAIR) 10 MG tablet SMARTSIG:1 Tablet(s) By Mouth Every Evening     traZODone (DESYREL) 50 MG tablet Take by mouth.     valACYclovir (VALTREX) 500 MG tablet Take one daily 90 tablet 1   No current facility-administered medications for this encounter.    Allergies  Allergen Reactions   Codeine Nausea And Vomiting    Social History   Socioeconomic History   Marital status: Married    Spouse name: Not on file   Number of children: Not on file   Years of education: Not on file   Highest education level: Not on file  Occupational History   Not on file   Tobacco Use   Smoking status: Never   Smokeless tobacco: Never  Vaping Use   Vaping Use: Never used  Substance and Sexual Activity   Alcohol use: Yes    Alcohol/week: 1.0 - 2.0 standard drink    Types: 1 - 2 Glasses of wine per week    Comment: occasional (up to 4/week, not every week)   Drug use: No   Sexual activity: Yes    Birth control/protection: Surgical    Comment: hysterectomy   Other Topics Concern   Not on file  Social History Narrative   Married.  2 daughters (one of whom has 2 rats).  Oldest is in college.   She works as a Social worker at [employer]: Not on [employer] Insecurity: Not on file  Transportation Needs: Not on file  Physical Activity: Not on file  Stress: Not on file  Social Connections: Not on file  Intimate Partner Violence: Not on file    Family History  Problem Relation Age of Onset   Bone cancer Paternal Grandmother    Stroke Maternal Grandmother    Heart attack Maternal Grandfather    Stomach cancer Maternal Grandfather    Heart attack Father 42       heart attacks at 26 and 7   Alcohol abuse Father    Hyperlipidemia Father    Heart disease Mother    Depression Mother    Colon cancer Maternal Uncle    Depression Sister    Alcohol abuse Brother    Hyperlipidemia Brother    Anxiety disorder Brother    Mental illness Daughter    Alcohol abuse Sister    Hyperlipidemia Sister    Depression Sister    Anxiety disorder Sister    Diabetes Maternal Uncle     ROS- All systems are reviewed and negative except as per the HPI above  Physical Exam: Vitals:   02/16/21 1107  BP: 122/70  Pulse: (!) 59  Weight: 83.6 kg  Height: '5\' 6"'$  (1.676 m)   Wt Readings from Last 3 Encounters:  02/16/21 83.6 kg  12/23/20 79.8 kg  12/03/20 80.7 kg    Labs: Lab Results  Component Value Date   NA 139 02/21/2018   K 4.5 02/21/2018   CL 106 02/21/2018   CO2 28 02/21/2018   GLUCOSE 91  02/21/2018   BUN 14 02/21/2018   CREATININE 0.96 02/21/2018   CALCIUM 8.9 02/21/2018   No results found for: INR Lab Results  Component Value Date   CHOL 168 10/30/2014   HDL 68 10/30/2014   LDLCALC 90 10/30/2014   TRIG 50 10/30/2014     GEN- The patient is well appearing, alert and oriented x 3 today.   Head- normocephalic, atraumatic Eyes-  Sclera clear, conjunctiva pink Ears- hearing intact Oropharynx- clear Neck- supple, no JVP Lymph- no cervical lymphadenopathy Lungs- Clear to ausculation bilaterally, normal work of breathing Heart- regular rate and rhythm, no murmurs, rubs or gallops, PMI not laterally displaced GI- soft, NT, ND, + BS Extremities- no clubbing, cyanosis, or edema MS- no significant deformity or atrophy Skin- no rash or lesion Psych- euthymic mood, full affect Neuro- strength and sensation are intact  EKG-sinus brady at 59 bpm, pr int 204 ms, qrs int 84 ms, qtc 433 ms Echo-Study Conclusions   - Procedure narrative: Transthoracic echocardiography. Image   quality was adequate. The study was technically difficult. - Left ventricle: The cavity size was normal. Systolic function was   normal. The estimated ejection fraction was in the range of 60%   to 65%. Wall motion was normal; there were no regional wall   motion abnormalities. The study is not technically sufficient to   allow evaluation of LV diastolic function. - Right atrium: The atrium was mildly dilated. - Atrial septum: Atrial septal aneurysm with marked bowing of the   mid and superior aspect of the septum to the right. No obvious   PFO   Bubble study not done.  Assessment and Plan: 1. Paroxysmal afib Unsuccessful cardioversion, 02/21/18, but loaded on Multaq and then with successful  cardioversion,03/08/18 and remains in SR today Continue multaq 400 mg bid, not on daily rate control for slow v response in afib and soft BP's at times  Qtc stable at 433 ms She has labs with PCP every 6 months    2. CHA2DS2VASc score of 1 Off anticoagulation per guidelines  F/u in 9 months   Butch Penny C. Cornelious Bartolucci, [facility] [address] [place], [place] [address] [phone]

## 2021-02-25 DIAGNOSIS — F4321 Adjustment disorder with depressed mood: Secondary | ICD-10-CM | POA: Diagnosis not present

## 2021-03-20 DIAGNOSIS — Z1231 Encounter for screening mammogram for malignant neoplasm of breast: Secondary | ICD-10-CM | POA: Diagnosis not present

## 2021-03-24 DIAGNOSIS — F4321 Adjustment disorder with depressed mood: Secondary | ICD-10-CM | POA: Diagnosis not present

## 2021-04-03 DIAGNOSIS — I4819 Other persistent atrial fibrillation: Secondary | ICD-10-CM | POA: Diagnosis not present

## 2021-04-03 DIAGNOSIS — J4520 Mild intermittent asthma, uncomplicated: Secondary | ICD-10-CM | POA: Diagnosis not present

## 2021-04-03 DIAGNOSIS — E7800 Pure hypercholesterolemia, unspecified: Secondary | ICD-10-CM | POA: Diagnosis not present

## 2021-04-03 DIAGNOSIS — E042 Nontoxic multinodular goiter: Secondary | ICD-10-CM | POA: Diagnosis not present

## 2021-04-03 DIAGNOSIS — G4733 Obstructive sleep apnea (adult) (pediatric): Secondary | ICD-10-CM | POA: Diagnosis not present

## 2021-04-10 DIAGNOSIS — M25512 Pain in left shoulder: Secondary | ICD-10-CM | POA: Diagnosis not present

## 2021-04-15 DIAGNOSIS — F4321 Adjustment disorder with depressed mood: Secondary | ICD-10-CM | POA: Diagnosis not present

## 2021-04-15 DIAGNOSIS — M5412 Radiculopathy, cervical region: Secondary | ICD-10-CM | POA: Diagnosis not present

## 2021-04-15 DIAGNOSIS — M25512 Pain in left shoulder: Secondary | ICD-10-CM | POA: Diagnosis not present

## 2021-04-16 DIAGNOSIS — H02831 Dermatochalasis of right upper eyelid: Secondary | ICD-10-CM | POA: Diagnosis not present

## 2021-04-20 DIAGNOSIS — M25512 Pain in left shoulder: Secondary | ICD-10-CM | POA: Diagnosis not present

## 2021-04-22 DIAGNOSIS — M25512 Pain in left shoulder: Secondary | ICD-10-CM | POA: Diagnosis not present

## 2021-04-27 DIAGNOSIS — M25512 Pain in left shoulder: Secondary | ICD-10-CM | POA: Diagnosis not present

## 2021-04-29 DIAGNOSIS — M25512 Pain in left shoulder: Secondary | ICD-10-CM | POA: Diagnosis not present

## 2021-05-04 DIAGNOSIS — M25512 Pain in left shoulder: Secondary | ICD-10-CM | POA: Diagnosis not present

## 2021-05-06 DIAGNOSIS — F4321 Adjustment disorder with depressed mood: Secondary | ICD-10-CM | POA: Diagnosis not present

## 2021-05-08 DIAGNOSIS — M25512 Pain in left shoulder: Secondary | ICD-10-CM | POA: Diagnosis not present

## 2021-05-11 DIAGNOSIS — M25512 Pain in left shoulder: Secondary | ICD-10-CM | POA: Diagnosis not present

## 2021-05-13 DIAGNOSIS — M25512 Pain in left shoulder: Secondary | ICD-10-CM | POA: Diagnosis not present

## 2021-05-18 DIAGNOSIS — M25512 Pain in left shoulder: Secondary | ICD-10-CM | POA: Diagnosis not present

## 2021-05-20 DIAGNOSIS — F4321 Adjustment disorder with depressed mood: Secondary | ICD-10-CM | POA: Diagnosis not present

## 2021-05-22 DIAGNOSIS — M25512 Pain in left shoulder: Secondary | ICD-10-CM | POA: Diagnosis not present

## 2021-05-25 DIAGNOSIS — M25512 Pain in left shoulder: Secondary | ICD-10-CM | POA: Diagnosis not present

## 2021-06-03 DIAGNOSIS — M25512 Pain in left shoulder: Secondary | ICD-10-CM | POA: Diagnosis not present

## 2021-06-05 DIAGNOSIS — M25512 Pain in left shoulder: Secondary | ICD-10-CM | POA: Diagnosis not present

## 2021-06-08 DIAGNOSIS — M25512 Pain in left shoulder: Secondary | ICD-10-CM | POA: Diagnosis not present

## 2021-06-17 DIAGNOSIS — F4321 Adjustment disorder with depressed mood: Secondary | ICD-10-CM | POA: Diagnosis not present

## 2021-06-17 DIAGNOSIS — M25512 Pain in left shoulder: Secondary | ICD-10-CM | POA: Diagnosis not present

## 2021-06-19 DIAGNOSIS — M25512 Pain in left shoulder: Secondary | ICD-10-CM | POA: Diagnosis not present

## 2021-06-24 DIAGNOSIS — M25512 Pain in left shoulder: Secondary | ICD-10-CM | POA: Diagnosis not present

## 2021-07-08 DIAGNOSIS — M25512 Pain in left shoulder: Secondary | ICD-10-CM | POA: Diagnosis not present

## 2021-07-10 ENCOUNTER — Ambulatory Visit: Payer: BC Managed Care – PPO | Admitting: Sports Medicine

## 2021-07-10 DIAGNOSIS — M2021 Hallux rigidus, right foot: Secondary | ICD-10-CM | POA: Diagnosis not present

## 2021-07-10 DIAGNOSIS — M2022 Hallux rigidus, left foot: Secondary | ICD-10-CM | POA: Diagnosis not present

## 2021-07-10 NOTE — Progress Notes (Signed)
Chief complaint  Bilateral foot pain that is somewhat worse in spite of orthotics  Patient has been in orthotics for a number of years and this is helped her continued stay active and walking She has first metatarsal insufficiency Hallux rigidus bilaterally Increased length of second and third rays Resting pronation  Her current orthotics are comfortable but she toward the end of the day is getting a lot of pain over her first MTP joint bilaterally She thinks this could limit her ability to walk for exercise  She is planning a trip to [place] in the next month and would like a second pair of orthotics for her walking shoes and she wonders if we can make some changes to make these give her better pain relief  Physical exam Pleasant white female in no acute distress BP 122/82    Ht 5\' 6"  (1.676 m)    Wt 180 lb (81.6 kg)    BMI 29.05 kg/m   Examination of the feet reveals bilateral pes planus with pronation She has significant hallux rigidus bilaterally Her pain areas over the in TP joint 1 and there is significant hypertrophy bilaterally There were no other abnormal calluses Other findings are as noted above

## 2021-07-10 NOTE — Assessment & Plan Note (Signed)
Patient was fitted for a : standard, cushioned, semi-rigid orthotic. The orthotic was heated and afterward the patient stood on the orthotic blank positioned on the orthotic stand. The patient was positioned in subtalar neutral position and 10 degrees of ankle dorsiflexion in a weight bearing stance. After completion of molding, a stable base was applied to the orthotic blank. The blank was ground to a stable position for weight bearing. Size: 9 Red med. EVA Base: blue med density Posting: first and second ray post bilat Additional orthotic padding: none  I made her a new pair of orthotics We changed the posting to cover the first and second ray on both of her pairs of orthotics This seem to give her good relief  We will continue to have her use these as much as possible And Voltaren gel over the first MTP joints bilaterally 2-4 times a day Follow-up pending response

## 2021-07-13 DIAGNOSIS — M25512 Pain in left shoulder: Secondary | ICD-10-CM | POA: Diagnosis not present

## 2021-07-15 DIAGNOSIS — M25512 Pain in left shoulder: Secondary | ICD-10-CM | POA: Diagnosis not present

## 2021-07-15 DIAGNOSIS — F4321 Adjustment disorder with depressed mood: Secondary | ICD-10-CM | POA: Diagnosis not present

## 2021-07-17 DIAGNOSIS — I4819 Other persistent atrial fibrillation: Secondary | ICD-10-CM | POA: Diagnosis not present

## 2021-07-17 DIAGNOSIS — Z13220 Encounter for screening for lipoid disorders: Secondary | ICD-10-CM | POA: Diagnosis not present

## 2021-07-17 DIAGNOSIS — Z0000 Encounter for general adult medical examination without abnormal findings: Secondary | ICD-10-CM | POA: Diagnosis not present

## 2021-07-22 DIAGNOSIS — M25512 Pain in left shoulder: Secondary | ICD-10-CM | POA: Diagnosis not present

## 2021-07-27 DIAGNOSIS — M25512 Pain in left shoulder: Secondary | ICD-10-CM | POA: Diagnosis not present

## 2021-07-29 DIAGNOSIS — M25512 Pain in left shoulder: Secondary | ICD-10-CM | POA: Diagnosis not present

## 2021-08-03 DIAGNOSIS — M25512 Pain in left shoulder: Secondary | ICD-10-CM | POA: Diagnosis not present

## 2021-08-05 DIAGNOSIS — M25512 Pain in left shoulder: Secondary | ICD-10-CM | POA: Diagnosis not present

## 2021-08-12 DIAGNOSIS — M25512 Pain in left shoulder: Secondary | ICD-10-CM | POA: Diagnosis not present

## 2021-08-12 DIAGNOSIS — F4321 Adjustment disorder with depressed mood: Secondary | ICD-10-CM | POA: Diagnosis not present

## 2021-08-13 ENCOUNTER — Ambulatory Visit: Payer: BC Managed Care – PPO | Admitting: Sports Medicine

## 2021-08-13 VITALS — BP 110/80 | Ht 66.0 in | Wt 180.0 lb

## 2021-08-13 DIAGNOSIS — M2021 Hallux rigidus, right foot: Secondary | ICD-10-CM

## 2021-08-13 DIAGNOSIS — M2022 Hallux rigidus, left foot: Secondary | ICD-10-CM

## 2021-08-13 DIAGNOSIS — R269 Unspecified abnormalities of gait and mobility: Secondary | ICD-10-CM | POA: Diagnosis not present

## 2021-08-13 NOTE — Progress Notes (Signed)
° °  Debra Hardin is a 61 y.o. female who presents to [facility] today for the following:  New Orthotics She got a new pair of tennis shoes that her insoles do not currently fit into  Patient was fitted for a : standard, cushioned, semi-rigid orthotic. The orthotic was heated and afterward the patient stood on the orthotic blank positioned on the orthotic stand. The patient was positioned in subtalar neutral position and 10 degrees of ankle dorsiflexion in a weight bearing stance. After completion of molding, a stable base was applied to the orthotic blank. The blank was ground to a stable position for weight bearing. Size: 11w Foot Support Tech Base: blue EVA Posting: 1st Ray Post B/L Additional orthotic padding: none  Total time spent with the patient was 30 minutes with greater than 50% of the time spent in face-to-face consultation discussing orthotic construction, instruction, and sitting. Gait was neutral with orthotics in place. Patient found them to be comfortable. Follow-up as needed.  [place] Constable, D.O.  PGY-4 [place] [facility]  08/13/2021 2:55 PM  I observed and examined the patient with the [facility] resident and agree with assessment and plan.  Note reviewed and modified by me. Ila Mcgill, MD

## 2021-08-14 DIAGNOSIS — M25512 Pain in left shoulder: Secondary | ICD-10-CM | POA: Diagnosis not present

## 2021-08-17 DIAGNOSIS — M25512 Pain in left shoulder: Secondary | ICD-10-CM | POA: Diagnosis not present

## 2021-08-19 DIAGNOSIS — M25512 Pain in left shoulder: Secondary | ICD-10-CM | POA: Diagnosis not present

## 2021-08-19 DIAGNOSIS — M79672 Pain in left foot: Secondary | ICD-10-CM | POA: Diagnosis not present

## 2021-08-19 DIAGNOSIS — M79671 Pain in right foot: Secondary | ICD-10-CM | POA: Diagnosis not present

## 2021-09-07 DIAGNOSIS — M79672 Pain in left foot: Secondary | ICD-10-CM | POA: Diagnosis not present

## 2021-09-07 DIAGNOSIS — M79671 Pain in right foot: Secondary | ICD-10-CM | POA: Diagnosis not present

## 2021-09-11 DIAGNOSIS — M79671 Pain in right foot: Secondary | ICD-10-CM | POA: Diagnosis not present

## 2021-09-11 DIAGNOSIS — M79672 Pain in left foot: Secondary | ICD-10-CM | POA: Diagnosis not present

## 2021-09-14 DIAGNOSIS — M79671 Pain in right foot: Secondary | ICD-10-CM | POA: Diagnosis not present

## 2021-09-14 DIAGNOSIS — M79672 Pain in left foot: Secondary | ICD-10-CM | POA: Diagnosis not present

## 2021-09-16 DIAGNOSIS — F4321 Adjustment disorder with depressed mood: Secondary | ICD-10-CM | POA: Diagnosis not present

## 2021-09-21 DIAGNOSIS — M79671 Pain in right foot: Secondary | ICD-10-CM | POA: Diagnosis not present

## 2021-09-21 DIAGNOSIS — M79672 Pain in left foot: Secondary | ICD-10-CM | POA: Diagnosis not present

## 2021-09-30 DIAGNOSIS — M79672 Pain in left foot: Secondary | ICD-10-CM | POA: Diagnosis not present

## 2021-09-30 DIAGNOSIS — M79671 Pain in right foot: Secondary | ICD-10-CM | POA: Diagnosis not present

## 2021-10-07 DIAGNOSIS — M79672 Pain in left foot: Secondary | ICD-10-CM | POA: Diagnosis not present

## 2021-10-07 DIAGNOSIS — M79671 Pain in right foot: Secondary | ICD-10-CM | POA: Diagnosis not present

## 2021-10-08 DIAGNOSIS — F4321 Adjustment disorder with depressed mood: Secondary | ICD-10-CM | POA: Diagnosis not present

## 2021-10-21 DIAGNOSIS — H401131 Primary open-angle glaucoma, bilateral, mild stage: Secondary | ICD-10-CM | POA: Diagnosis not present

## 2021-10-21 DIAGNOSIS — M79672 Pain in left foot: Secondary | ICD-10-CM | POA: Diagnosis not present

## 2021-10-21 DIAGNOSIS — M25512 Pain in left shoulder: Secondary | ICD-10-CM | POA: Diagnosis not present

## 2021-10-21 DIAGNOSIS — M79671 Pain in right foot: Secondary | ICD-10-CM | POA: Diagnosis not present

## 2021-11-11 DIAGNOSIS — F4321 Adjustment disorder with depressed mood: Secondary | ICD-10-CM | POA: Diagnosis not present

## 2021-11-18 ENCOUNTER — Encounter (HOSPITAL_COMMUNITY): Payer: Self-pay | Admitting: Nurse Practitioner

## 2021-11-18 ENCOUNTER — Ambulatory Visit (HOSPITAL_COMMUNITY)
Admission: RE | Admit: 2021-11-18 | Discharge: 2021-11-18 | Disposition: A | Discharge status: Home / Self Care | Payer: BC Managed Care – PPO | Source: Ambulatory Visit | Attending: Nurse Practitioner | Admitting: Nurse Practitioner

## 2021-11-18 VITALS — BP 98/62 | HR 75 | Ht 66.0 in | Wt 183.8 lb

## 2021-11-18 DIAGNOSIS — I480 Paroxysmal atrial fibrillation: Secondary | ICD-10-CM | POA: Insufficient documentation

## 2021-11-18 DIAGNOSIS — D2262 Melanocytic nevi of left upper limb, including shoulder: Secondary | ICD-10-CM | POA: Diagnosis not present

## 2021-11-18 DIAGNOSIS — E041 Nontoxic single thyroid nodule: Secondary | ICD-10-CM | POA: Insufficient documentation

## 2021-11-18 DIAGNOSIS — L814 Other melanin hyperpigmentation: Secondary | ICD-10-CM | POA: Diagnosis not present

## 2021-11-18 DIAGNOSIS — I4891 Unspecified atrial fibrillation: Secondary | ICD-10-CM

## 2021-11-18 DIAGNOSIS — D225 Melanocytic nevi of trunk: Secondary | ICD-10-CM | POA: Diagnosis not present

## 2021-11-18 DIAGNOSIS — L578 Other skin changes due to chronic exposure to nonionizing radiation: Secondary | ICD-10-CM | POA: Diagnosis not present

## 2021-11-18 MED ORDER — MULTAQ 400 MG PO TABS: 400.0000 mg | ORAL_TABLET | Freq: Two times a day (BID) | ORAL | 2 refills | Status: DC

## 2021-11-18 MED ORDER — DILTIAZEM HCL 30 MG PO TABS: ORAL_TABLET | ORAL | 1 refills | Status: DC

## 2021-11-18 NOTE — Addendum Note (Signed)
Encounter addended by: Sherran Needs, NP on: 11/18/2021 1:18 PM ? Actions taken: Clinical Note Signed

## 2021-11-18 NOTE — Progress Notes (Addendum)
? ?Primary Care Physician: Leeroy Cha, MD ?Referring Physician: Same ?EP: Dr. Rayann Heman ? ? ?Debra Hardin is a 61 y.o. female with a h/o multiple thyroid nodules, mild OSA in the past, not using cpap, that was initially seen the afib clinic for evaluation after being found to have new onset afib at the time of colonoscopy . Pt did go on to  have the procedure without event. She continued in afib today, rate controlled. She reports that she has noted fluttering in her chest for around one year. She does notice palpitations, fatigue, no unusual shortness of breath. Was drinking some alcohol, minimal caffeine now, no tobacco,. H/o of mildly sleep apnea by sleep study 8 years ago, never really used CPAP. Admitted to a 10 lb weight gain over the last 6-9 months. She had a chadsvasc score of 1. 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 ? ?F/u afib clinic, 02/01/18. She remains for the most part in rate controlled afib, without rate control meds. BP has been running on the lower side so that would make use of rate control difficult.. She has been on anticoagulation for almost one week now , no issues with bleeding. Discussed the plan is still to purse cardioversion  after 3 weeks of uninterrupted anticoagulation. Echo reviewed with pt and does show aneurysm of the atrial septal wall without PFO noted. I had reviewed this with Dr. Aundra Dubin and he said no w/u was needed. Would only be significant if pt had had a stroke.  ? ?F/u in afib clinic, f/u cardioversion that was not successful despite being shocked x 3 times. Discussed options to restore SR and pt would prefer to try Multaq. No missed doses of eliquis. She had a cmet at MD's office recently with normal liver enzymes. ? ?F/u 03/14/19. Cardioversion with multaq on board was successful and she remains in SR today. She however, does not feel that improved. Still has some fatigue but is happy to be  back in SR. ? ?F/u in afib clinic,02/07/19. She is staying in [place] with Multaq and feels well. Fatigue greatly improved. She has had multiple sleep studies thru Dr. Maxwell Caul as she did test positive for sleep apnea but did not tolerate cpap. She went on to have an oral appliance made that controlled the apnea but adversely affected her bite.thru additional sleep studies it was found that if she wore a tennis ball t shirt and stayed on her side, she would not snore. So this is what she is doing currently.  ? ?F/u in afib clinic, 08/06/19. She reports no afib, Multaq still working well for her. Recently changed her job and has an adult daughter back home, both have been stressful for her. CHA2DS2VASc score remains 1.  ? ?F/u in afib clinic, 7/26, for one year f/u in afib clinic, for continued use of Multaq. She has not noted any afib but is planning to go  on vacation  soon to 2 different states and is wanting a back up plan if she should go into afib.  ? ?F/u in afib clinic, 02/16/21. She continues on Multaq 400 mg bid, no afib to report. She is not on anticoagulation with a CHA2DS2VASc  score of 1.  ? ?F/u in afib clinic for Multaq surveillance, 01/18/22. She reports that she is doing well. No afib to report. Continues on Multaq  400 mg bid .  ? ?Today, she denies symptoms of palpitations,  chest pain, shortness of breath, orthopnea, PND, lower extremity edema, dizziness, presyncope, syncope, or neurologic sequela. The patient is tolerating medications without difficulties and is otherwise without complaint today.  ? ?Past Medical History:  ?Diagnosis Date  ? Anxiety   ? Dr. [name]  ? Asthma   ? Bronchitis   ? Depression   ? HSV-2 (herpes simplex virus 2) infection   ? Hyperlipidemia   ? Insomnia   ? Lateral epicondylitis right; 09/2014  ? Dr. [name]  ? Multiple thyroid nodules 03/2014  ? more prominent on L>R; h/o benign bx. f/u 1 yr  ? OSA (obstructive sleep apnea)   ? CPAP (uses sporadically)  ? Trochanteric  bursitis 01/2015  ? Vaginal dryness   ? ?Past Surgical History:  ?Procedure Laterality Date  ? CARDIOVERSION N/A 02/21/2018  ? Procedure: CARDIOVERSION;  Surgeon: Sueanne Margarita, MD;  Location[facility];  Service: Cardiovascular;  Laterality: N/A;  ? CARDIOVERSION N/A 03/08/2018  ? Procedure: CARDIOVERSION;  Surgeon: Buford Dresser, MD;  Location: [place];  Service: Cardiovascular;  Laterality: N/A;  ? COMBINED HYSTERECTOMY VAGINAL / OOPHORECTOMY / A&P REPAIR  2008  ? pt states she has her ovaries  ? CRYOTHERAPY    ? DILATION AND CURETTAGE OF UTERUS    ? HERNIA REPAIR  age 89  ? LIH  ? HYSTEROTOMY    ? KNEE SURGERY Right   ? twice--Dr. Onnie Graham, Dr. Lynann Bologna (25 years ago)  ? ? ?Current Outpatient Medications  ?Medication Sig Dispense Refill  ? albuterol (PROVENTIL HFA;VENTOLIN HFA) 108 (90 BASE) MCG/ACT inhaler Inhale 2 puffs into the lungs every 6 (six) hours as needed for wheezing or shortness of breath. 1 Inhaler 1  ? cetirizine (ZYRTEC) 10 MG tablet Take 10 mg by mouth at bedtime.     ? estradiol (VIVELLE-DOT) 0.05 MG/24HR patch PLACE ONTO SKIN AND CHANGE TWICE A WEEK 8 patch 4  ? fluticasone (FLONASE) 50 MCG/ACT nasal spray Place 1 spray into both nostrils as needed.    ? latanoprost (XALATAN) 0.005 % ophthalmic solution Place 1 drop into both eyes every morning.    ? montelukast (SINGULAIR) 10 MG tablet SMARTSIG:1 Tablet(s) By Mouth Every Evening    ? traZODone (DESYREL) 50 MG tablet Take 50 mg by mouth at bedtime.    ? valACYclovir (VALTREX) 500 MG tablet Take one daily 90 tablet 1  ? diltiazem (CARDIZEM) 30 MG tablet Take 1 tablet every 4 hours AS NEEDED for heart rate >100 as long as top blood pressure >100. 45 tablet 1  ? dronedarone (MULTAQ) 400 MG tablet Take 1 tablet (400 mg total) by mouth 2 (two) times daily with a meal. 180 tablet 2  ? ?No current facility-administered medications for this encounter.  ? ? ?Allergies  ?Allergen Reactions  ? Codeine Nausea And Vomiting  ? Venlafaxine Hcl  Other (See Comments)  ?  Had trouble sleeping,   ? ? ?Social History  ? ?Socioeconomic History  ? Marital status: Married  ?  Spouse name: Not on file  ? Number of children: Not on file  ? Years of education: Not on file  ? Highest education level: Not on file  ?Occupational History  ? Not on file  ?Tobacco Use  ? Smoking status: Never  ? Smokeless tobacco: Never  ?Vaping Use  ? Vaping Use: Never used  ?Substance and Sexual Activity  ? Alcohol use: Yes  ?  Alcohol/week: 1.0 - 2.0 standard drink  ?  Types: 1 - 2  Glasses of wine per week  ?  Comment: occasional (up to 4/week, not every week)  ? Drug use: No  ? Sexual activity: Yes  ?  Birth control/protection: Surgical  ?  Comment: hysterectomy   ?Other Topics Concern  ? Not on file  ?Social History Narrative  ? Married.  2 daughters (one of whom has 2 rats).  Oldest is in college.  ? She works as a Social worker at [employer]  ? ?Social Determinants of Health  ? ?Financial Resource Strain: Not on file  ?Food Insecurity: Not on file  ?Transportation Needs: Not on file  ?Physical Activity: Not on file  ?Stress: Not on file  ?Social Connections: Not on file  ?Intimate Partner Violence: Not on file  ? ? ?Family History  ?Problem Relation Age of Onset  ? Bone cancer Paternal Grandmother   ? Stroke Maternal Grandmother   ? Heart attack Maternal Grandfather   ? Stomach cancer Maternal Grandfather   ? Heart attack Father 73  ?     heart attacks at 54 and 45  ? Alcohol abuse Father   ? Hyperlipidemia Father   ? Heart disease Mother   ? Depression Mother   ? Colon cancer Maternal Uncle   ? Depression Sister   ? Alcohol abuse Brother   ? Hyperlipidemia Brother   ? Anxiety disorder Brother   ? Mental illness Daughter   ? Alcohol abuse Sister   ? Hyperlipidemia Sister   ? Depression Sister   ? Anxiety disorder Sister   ? Diabetes Maternal Uncle   ? ? ?ROS- All systems are reviewed and negative except as per the HPI above ? ?Physical Exam: ?Vitals:  ? 11/18/21 1125  ?BP: 98/62   ?Pulse: 75  ?Weight: 83.4 kg  ?Height: '5\' 6"'$  (1.676 m)  ? ?Wt Readings from Last 3 Encounters:  ?11/18/21 83.4 kg  ?08/13/21 81.6 kg  ?07/10/21 81.6 kg  ? ? ?Labs: ?Lab Results  ?Component Value Date  ? NA 139

## 2021-12-16 DIAGNOSIS — F4321 Adjustment disorder with depressed mood: Secondary | ICD-10-CM | POA: Diagnosis not present

## 2022-01-15 DIAGNOSIS — J309 Allergic rhinitis, unspecified: Secondary | ICD-10-CM | POA: Diagnosis not present

## 2022-01-15 DIAGNOSIS — J4520 Mild intermittent asthma, uncomplicated: Secondary | ICD-10-CM | POA: Diagnosis not present

## 2022-01-15 DIAGNOSIS — E7800 Pure hypercholesterolemia, unspecified: Secondary | ICD-10-CM | POA: Diagnosis not present

## 2022-01-15 DIAGNOSIS — R42 Dizziness and giddiness: Secondary | ICD-10-CM | POA: Diagnosis not present

## 2022-01-20 DIAGNOSIS — F4321 Adjustment disorder with depressed mood: Secondary | ICD-10-CM | POA: Diagnosis not present

## 2022-02-04 DIAGNOSIS — F4321 Adjustment disorder with depressed mood: Secondary | ICD-10-CM | POA: Diagnosis not present

## 2022-02-11 DIAGNOSIS — F4321 Adjustment disorder with depressed mood: Secondary | ICD-10-CM | POA: Diagnosis not present

## 2022-03-03 DIAGNOSIS — F4321 Adjustment disorder with depressed mood: Secondary | ICD-10-CM | POA: Diagnosis not present

## 2022-03-25 DIAGNOSIS — F4321 Adjustment disorder with depressed mood: Secondary | ICD-10-CM | POA: Diagnosis not present

## 2022-04-01 DIAGNOSIS — K649 Unspecified hemorrhoids: Secondary | ICD-10-CM | POA: Diagnosis not present

## 2022-04-01 DIAGNOSIS — N9089 Other specified noninflammatory disorders of vulva and perineum: Secondary | ICD-10-CM | POA: Diagnosis not present

## 2022-04-01 DIAGNOSIS — K6289 Other specified diseases of anus and rectum: Secondary | ICD-10-CM | POA: Diagnosis not present

## 2022-04-01 DIAGNOSIS — N951 Menopausal and female climacteric states: Secondary | ICD-10-CM | POA: Diagnosis not present

## 2022-05-05 DIAGNOSIS — H401131 Primary open-angle glaucoma, bilateral, mild stage: Secondary | ICD-10-CM | POA: Diagnosis not present

## 2022-05-10 DIAGNOSIS — Z1231 Encounter for screening mammogram for malignant neoplasm of breast: Secondary | ICD-10-CM | POA: Diagnosis not present

## 2022-05-12 DIAGNOSIS — F4321 Adjustment disorder with depressed mood: Secondary | ICD-10-CM | POA: Diagnosis not present

## 2022-05-17 DIAGNOSIS — N6001 Solitary cyst of right breast: Secondary | ICD-10-CM | POA: Diagnosis not present

## 2022-05-19 ENCOUNTER — Ambulatory Visit: Payer: BC Managed Care – PPO | Attending: Cardiology | Admitting: Cardiology

## 2022-05-19 ENCOUNTER — Encounter: Payer: Self-pay | Admitting: Cardiology

## 2022-05-19 VITALS — BP 108/66 | HR 67 | Ht 66.0 in | Wt 180.0 lb

## 2022-05-19 DIAGNOSIS — I4819 Other persistent atrial fibrillation: Secondary | ICD-10-CM

## 2022-05-19 MED ORDER — MULTAQ 400 MG PO TABS: 400.0000 mg | ORAL_TABLET | Freq: Two times a day (BID) | ORAL | 3 refills | Status: DC

## 2022-05-19 NOTE — Progress Notes (Signed)
Electrophysiology Office Note   Date:  05/19/2022   ID:  Debra, Hardin Feb 25, 1961, MRN [id number]  PCP:  Leeroy Cha, MD  Cardiologist:   Primary Electrophysiologist:  Alexus Michael Meredith Leeds, MD    Chief Complaint: AF   History of Present Illness: Debra Hardin is a 61 y.o. female who is being seen today for the evaluation of AF at the request of Leeroy Cha,*. Presenting today for electrophysiology evaluation.  She has a history significant for thyroid nodules, obstructive sleep apnea, atrial fibrillation, hyperlipidemia.  She is currently on Multaq.  Today, she denies symptoms of palpitations, chest pain, shortness of breath, orthopnea, PND, lower extremity edema, claudication, dizziness, presyncope, syncope, bleeding, or neurologic sequela. The patient is tolerating medications without difficulties.    Past Medical History:  Diagnosis Date   Anxiety    Dr. Theadore Nan Braden   Asthma    Bronchitis    Depression    HSV-2 (herpes simplex virus 2) infection    Hyperlipidemia    Insomnia    Lateral epicondylitis right; 09/2014   Dr. [name]   Multiple thyroid nodules 03/2014   more prominent on L>R; h/o benign bx. f/u 1 yr   OSA (obstructive sleep apnea)    CPAP (uses sporadically)   Trochanteric bursitis 01/2015   Vaginal dryness    Past Surgical History:  Procedure Laterality Date   CARDIOVERSION N/A 02/21/2018   Procedure: CARDIOVERSION;  Surgeon: Sueanne Margarita, MD;  Location: [place];  Service: Cardiovascular;  Laterality: N/A;   CARDIOVERSION N/A 03/08/2018   Procedure: CARDIOVERSION;  Surgeon: Buford Dresser, MD;  Location: [place];  Service: Cardiovascular;  Laterality: N/A;   COMBINED HYSTERECTOMY VAGINAL / OOPHORECTOMY / A&P REPAIR  2008   pt states she has her ovaries   CRYOTHERAPY     DILATION AND CURETTAGE OF UTERUS     HERNIA REPAIR  age 89   [place] Right    twice--Dr. Onnie Graham,  Dr. Lynann Bologna (25 years ago)     Current Outpatient Medications  Medication Sig Dispense Refill   albuterol (PROVENTIL HFA;VENTOLIN HFA) 108 (90 BASE) MCG/ACT inhaler Inhale 2 puffs into the lungs every 6 (six) hours as needed for wheezing or shortness of breath. 1 Inhaler 1   cetirizine (ZYRTEC) 10 MG tablet Take 10 mg by mouth at bedtime.      diltiazem (CARDIZEM) 30 MG tablet Take 1 tablet every 4 hours AS NEEDED for heart rate >100 as long as top blood pressure >100. 45 tablet 1   dorzolamide (TRUSOPT) 2 % ophthalmic solution Place 1 drop into the left eye 2 (two) times daily.     dronedarone (MULTAQ) 400 MG tablet Take 1 tablet (400 mg total) by mouth 2 (two) times daily with a meal. 180 tablet 2   estradiol (VIVELLE-DOT) 0.05 MG/24HR patch PLACE ONTO SKIN AND CHANGE TWICE A WEEK (Patient taking differently: Change when symptoms need addressing, currently wearing 5 - 6 days) 8 patch 4   fluticasone (FLONASE) 50 MCG/ACT nasal spray Place 1 spray into both nostrils as needed.     latanoprost (XALATAN) 0.005 % ophthalmic solution Place 1 drop into both eyes every morning.     montelukast (SINGULAIR) 10 MG tablet SMARTSIG:1 Tablet(s) By Mouth Every Evening     traZODone (DESYREL) 50 MG tablet Take 50 mg by mouth at bedtime.     valACYclovir (VALTREX) 500 MG tablet Take one daily 90 tablet 1  No current facility-administered medications for this visit.    Allergies:   Codeine and Venlafaxine hcl   Social History:  The patient  reports that she has never smoked. She has never used smokeless tobacco. She reports current alcohol use of about 1.0 - 2.0 standard drink of alcohol per week. She reports that she does not use drugs.   Family History:  The patient's family history includes Alcohol abuse in her brother, father, and sister; Anxiety disorder in her brother and sister; Bone cancer in her paternal grandmother; Colon cancer in her maternal uncle; Depression in her mother, sister, and sister;  Diabetes in her maternal uncle; Heart attack in her maternal grandfather; Heart attack (age of onset: 25) in her father; Heart disease in her mother; Hyperlipidemia in her brother, father, and sister; Mental illness in her daughter; Stomach cancer in her maternal grandfather; Stroke in her maternal grandmother.    ROS:  Please see the history of present illness.   Otherwise, review of systems is positive for none.   All other systems are reviewed and negative.    PHYSICAL EXAM: VS:  BP 108/66   Pulse 67   Ht '5\' 6"'$  (1.676 m)   Wt 180 lb (81.6 kg)   SpO2 99%   BMI 29.05 kg/m  , BMI Body mass index is 29.05 kg/m. GEN: Well nourished, well developed, in no acute distress  HEENT: normal  Neck: no JVD, carotid bruits, or masses Cardiac: RRR; no murmurs, rubs, or gallops,no edema  Respiratory:  clear to auscultation bilaterally, normal work of breathing GI: soft, nontender, nondistended, + BS MS: no deformity or atrophy  Skin: warm and dry Neuro:  Strength and sensation are intact Psych: euthymic mood, full affect  EKG:  EKG is ordered today. Personal review of the ekg ordered shows sinus rhythm  Recent Labs: No results found for requested labs within last 365 days.    Lipid Panel     Component Value Date/Time   CHOL 168 10/30/2014 0950   TRIG 50 10/30/2014 0950   HDL 68 10/30/2014 0950   CHOLHDL 2.5 10/30/2014 0950   VLDL 10 10/30/2014 0950   LDLCALC 90 10/30/2014 0950     Wt Readings from Last 3 Encounters:  05/19/22 180 lb (81.6 kg)  11/18/21 183 lb 12.8 oz (83.4 kg)  08/13/21 180 lb (81.6 kg)      Other studies Reviewed: Additional studies/ records that were reviewed today include: TTE 2019  Review of the above records today demonstrates:  - Left ventricle: The cavity size was normal. Systolic function was    normal. The estimated ejection fraction was in the range of 60%    to 65%. Wall motion was normal; there were no regional wall    motion abnormalities. The  study is not technically sufficient to    allow evaluation of LV diastolic function.  - Right atrium: The atrium was mildly dilated.  - Atrial septum: Atrial septal aneurysm with marked bowing of the    mid and superior aspect of the septum to the right. No obvious    PFO    Bubble study not done.    ASSESSMENT AND PLAN:  1.  Persistent atrial fibrillation: Currently on Multaq 400 mg twice daily.  CHA2DS2-VASc of 1 and thus not anticoagulated.  Maintain sinus rhythm.  She is overall quite comfortable with her control.  She understands that if she goes back into atrial fibrillation, alternative medications or ablation would be options.  For now  she would prefer to continue with her current management.    Current medicines are reviewed at length with the patient today.   The patient does not have concerns regarding her medicines.  The following changes were made today:  none  Labs/ tests ordered today include:  Orders Placed This Encounter  Procedures   EKG 12-Lead     Disposition:   FU with Shemeka Wardle 1 year  Signed, Francyne Arreaga Meredith Leeds, MD  05/19/2022 10:01 AM     [facility] [address] [place] [address] [phone] (office) [phone] (fax)

## 2022-05-19 NOTE — Addendum Note (Signed)
Addended by: Stanton Kidney on: 05/19/2022 10:04 AM   Modules accepted: Orders

## 2022-06-02 DIAGNOSIS — F4321 Adjustment disorder with depressed mood: Secondary | ICD-10-CM | POA: Diagnosis not present

## 2022-06-16 DIAGNOSIS — H401131 Primary open-angle glaucoma, bilateral, mild stage: Secondary | ICD-10-CM | POA: Diagnosis not present

## 2022-07-14 DIAGNOSIS — F4321 Adjustment disorder with depressed mood: Secondary | ICD-10-CM | POA: Diagnosis not present

## 2022-08-11 DIAGNOSIS — F4321 Adjustment disorder with depressed mood: Secondary | ICD-10-CM | POA: Diagnosis not present

## 2022-08-12 ENCOUNTER — Encounter (HOSPITAL_COMMUNITY): Payer: Self-pay | Admitting: *Deleted

## 2022-08-17 DIAGNOSIS — H02831 Dermatochalasis of right upper eyelid: Secondary | ICD-10-CM | POA: Diagnosis not present

## 2022-09-01 DIAGNOSIS — F4321 Adjustment disorder with depressed mood: Secondary | ICD-10-CM | POA: Diagnosis not present

## 2022-09-15 DIAGNOSIS — F4321 Adjustment disorder with depressed mood: Secondary | ICD-10-CM | POA: Diagnosis not present

## 2022-09-20 DIAGNOSIS — J4521 Mild intermittent asthma with (acute) exacerbation: Secondary | ICD-10-CM | POA: Diagnosis not present

## 2022-09-20 DIAGNOSIS — J309 Allergic rhinitis, unspecified: Secondary | ICD-10-CM | POA: Diagnosis not present

## 2022-09-20 DIAGNOSIS — E7800 Pure hypercholesterolemia, unspecified: Secondary | ICD-10-CM | POA: Diagnosis not present

## 2022-09-20 DIAGNOSIS — Z0000 Encounter for general adult medical examination without abnormal findings: Secondary | ICD-10-CM | POA: Diagnosis not present

## 2022-09-20 DIAGNOSIS — E559 Vitamin D deficiency, unspecified: Secondary | ICD-10-CM | POA: Diagnosis not present

## 2022-09-20 DIAGNOSIS — Z7189 Other specified counseling: Secondary | ICD-10-CM | POA: Diagnosis not present

## 2022-10-27 DIAGNOSIS — F4321 Adjustment disorder with depressed mood: Secondary | ICD-10-CM | POA: Diagnosis not present

## 2022-11-17 DIAGNOSIS — H02834 Dermatochalasis of left upper eyelid: Secondary | ICD-10-CM | POA: Diagnosis not present

## 2022-11-17 DIAGNOSIS — H02831 Dermatochalasis of right upper eyelid: Secondary | ICD-10-CM | POA: Diagnosis not present

## 2022-11-19 IMAGING — DX DG FOOT COMPLETE 3+V*R*
3 series · 3 of 3 positions shown · non-contrast
Comparison: None.

CLINICAL DATA: Twisting injury with right foot pain, initial
encounter

EXAM:
RIGHT FOOT COMPLETE - 3+ VIEW

[dg foot complete right (1 of 3)]
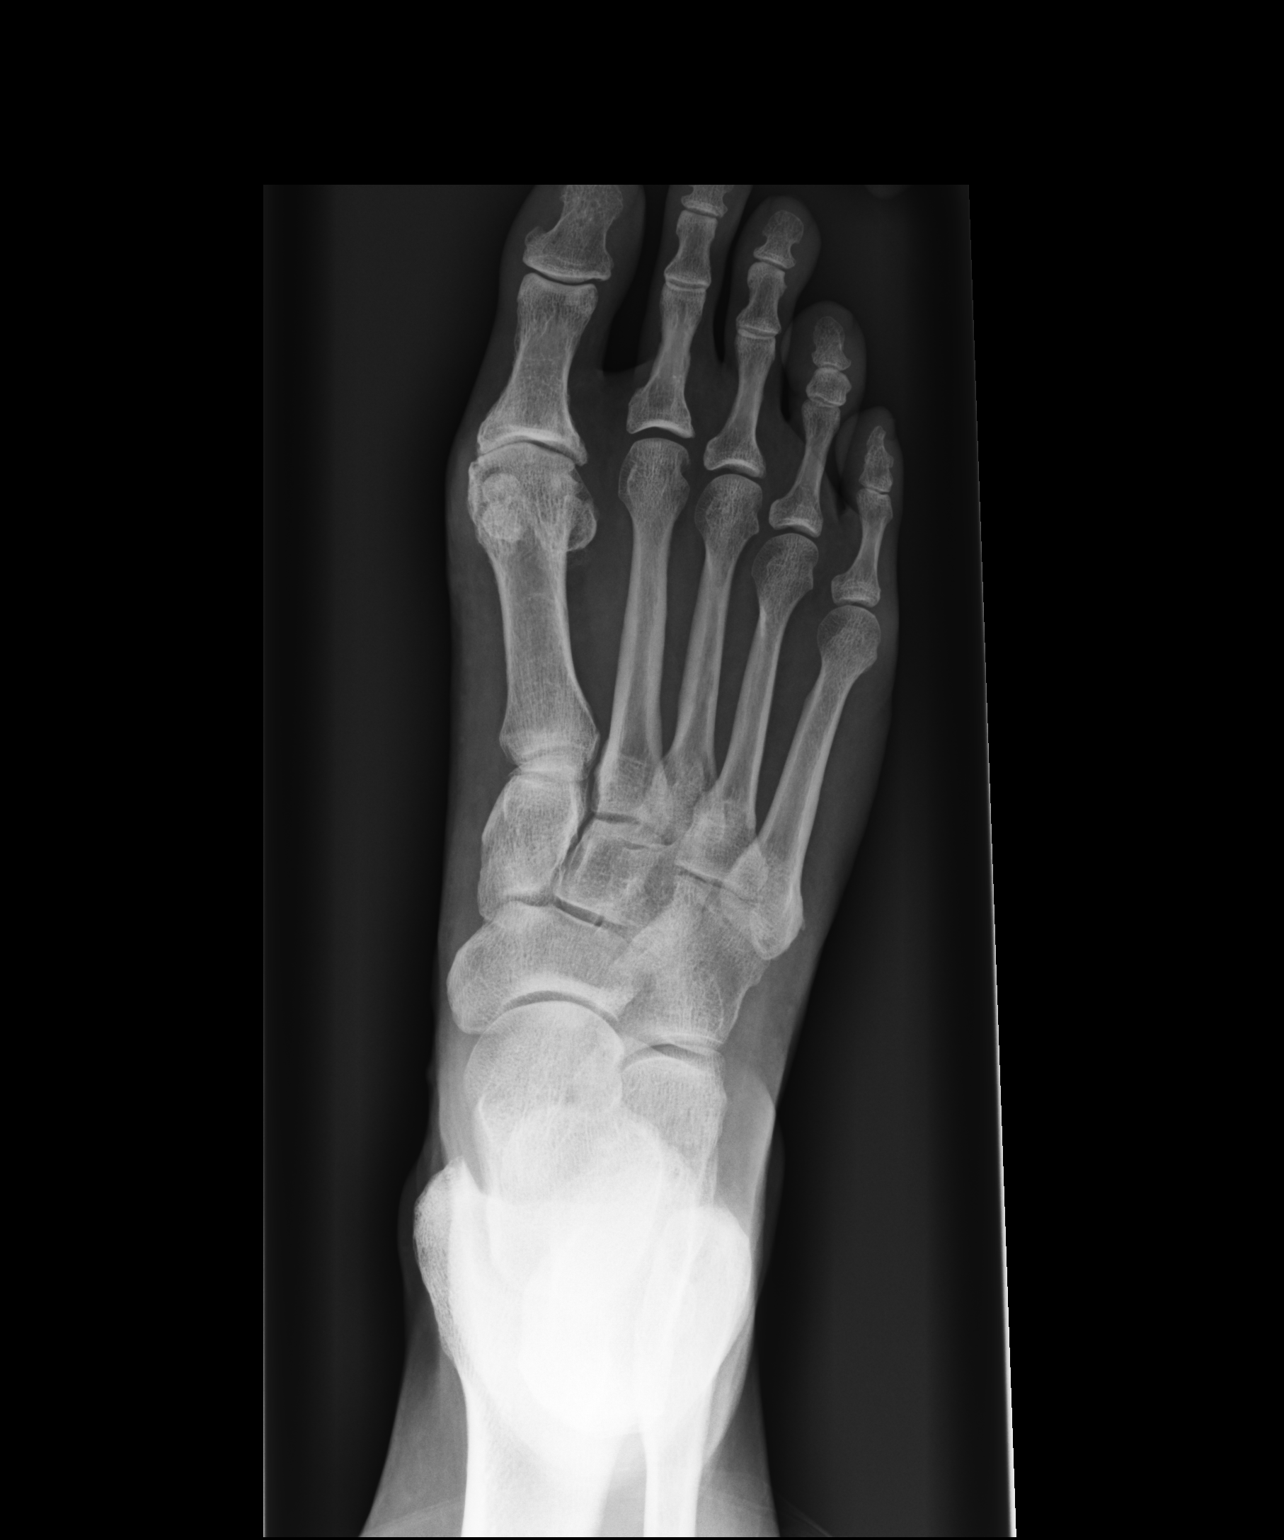

[dg foot complete right (2 of 3)]
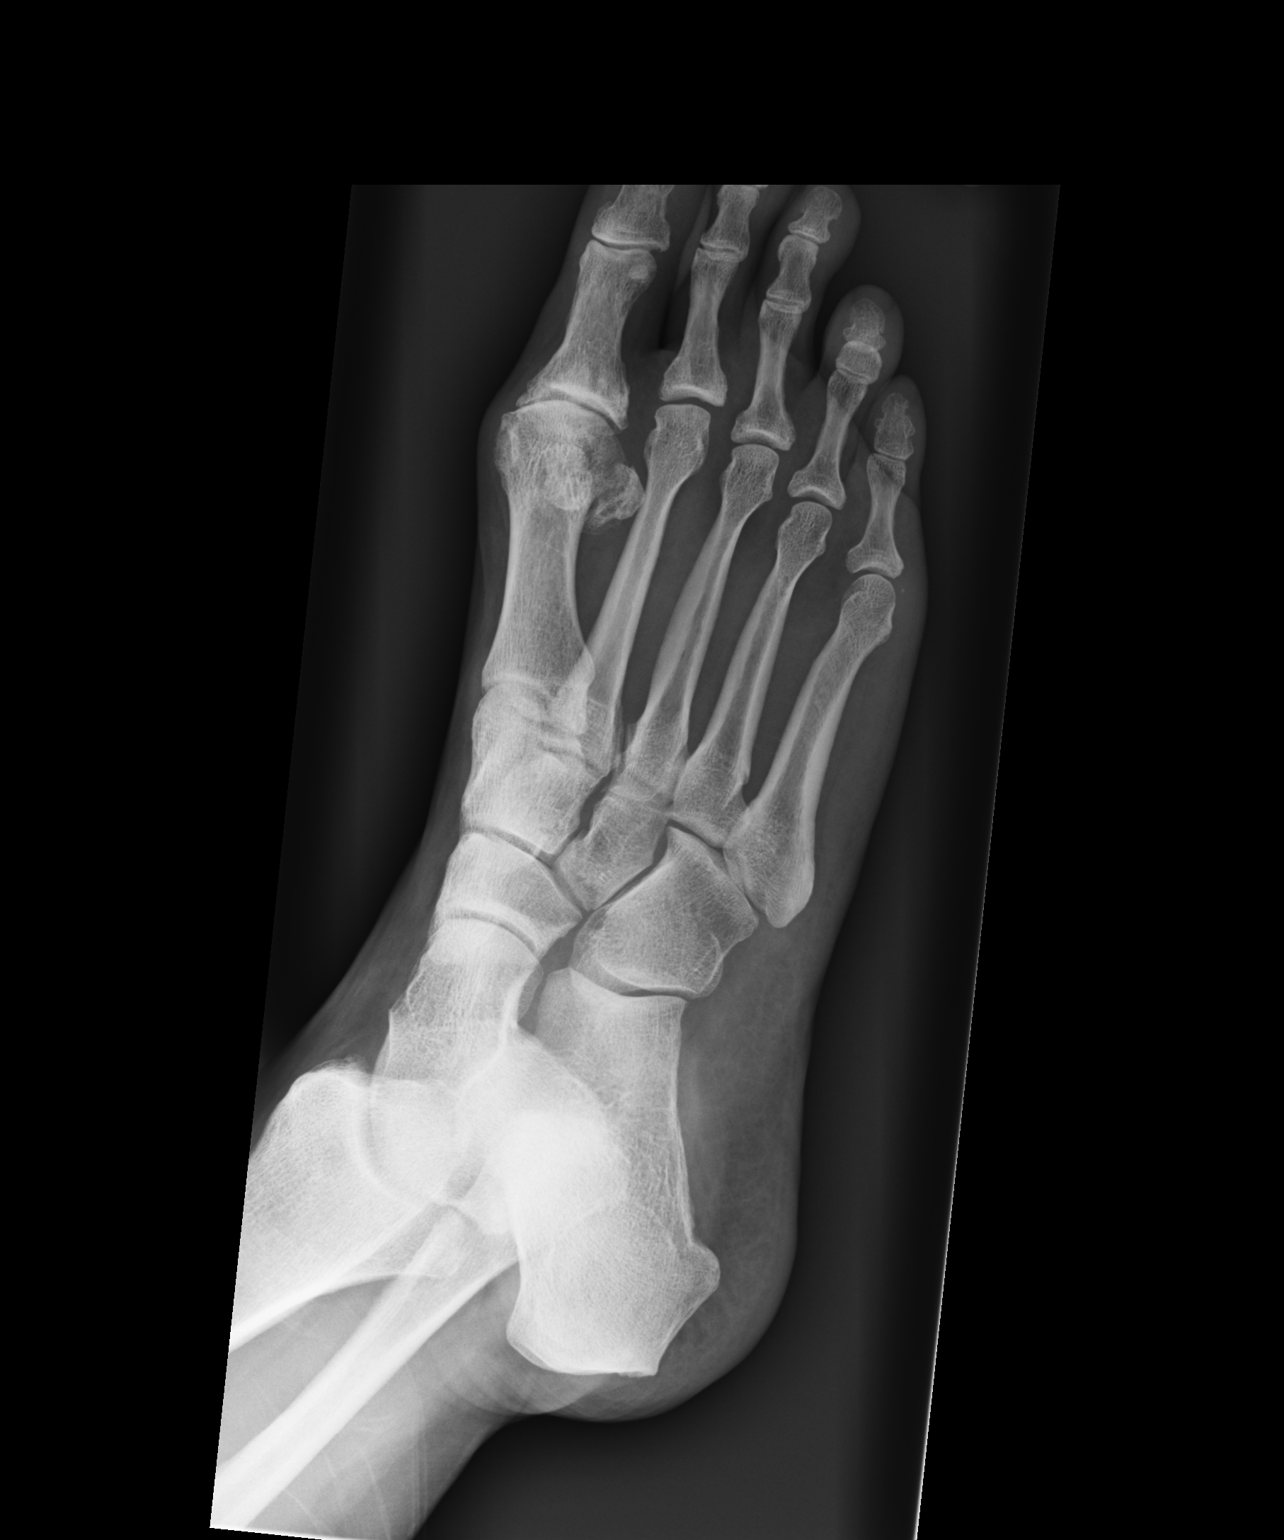

[dg foot complete right (3 of 3)]
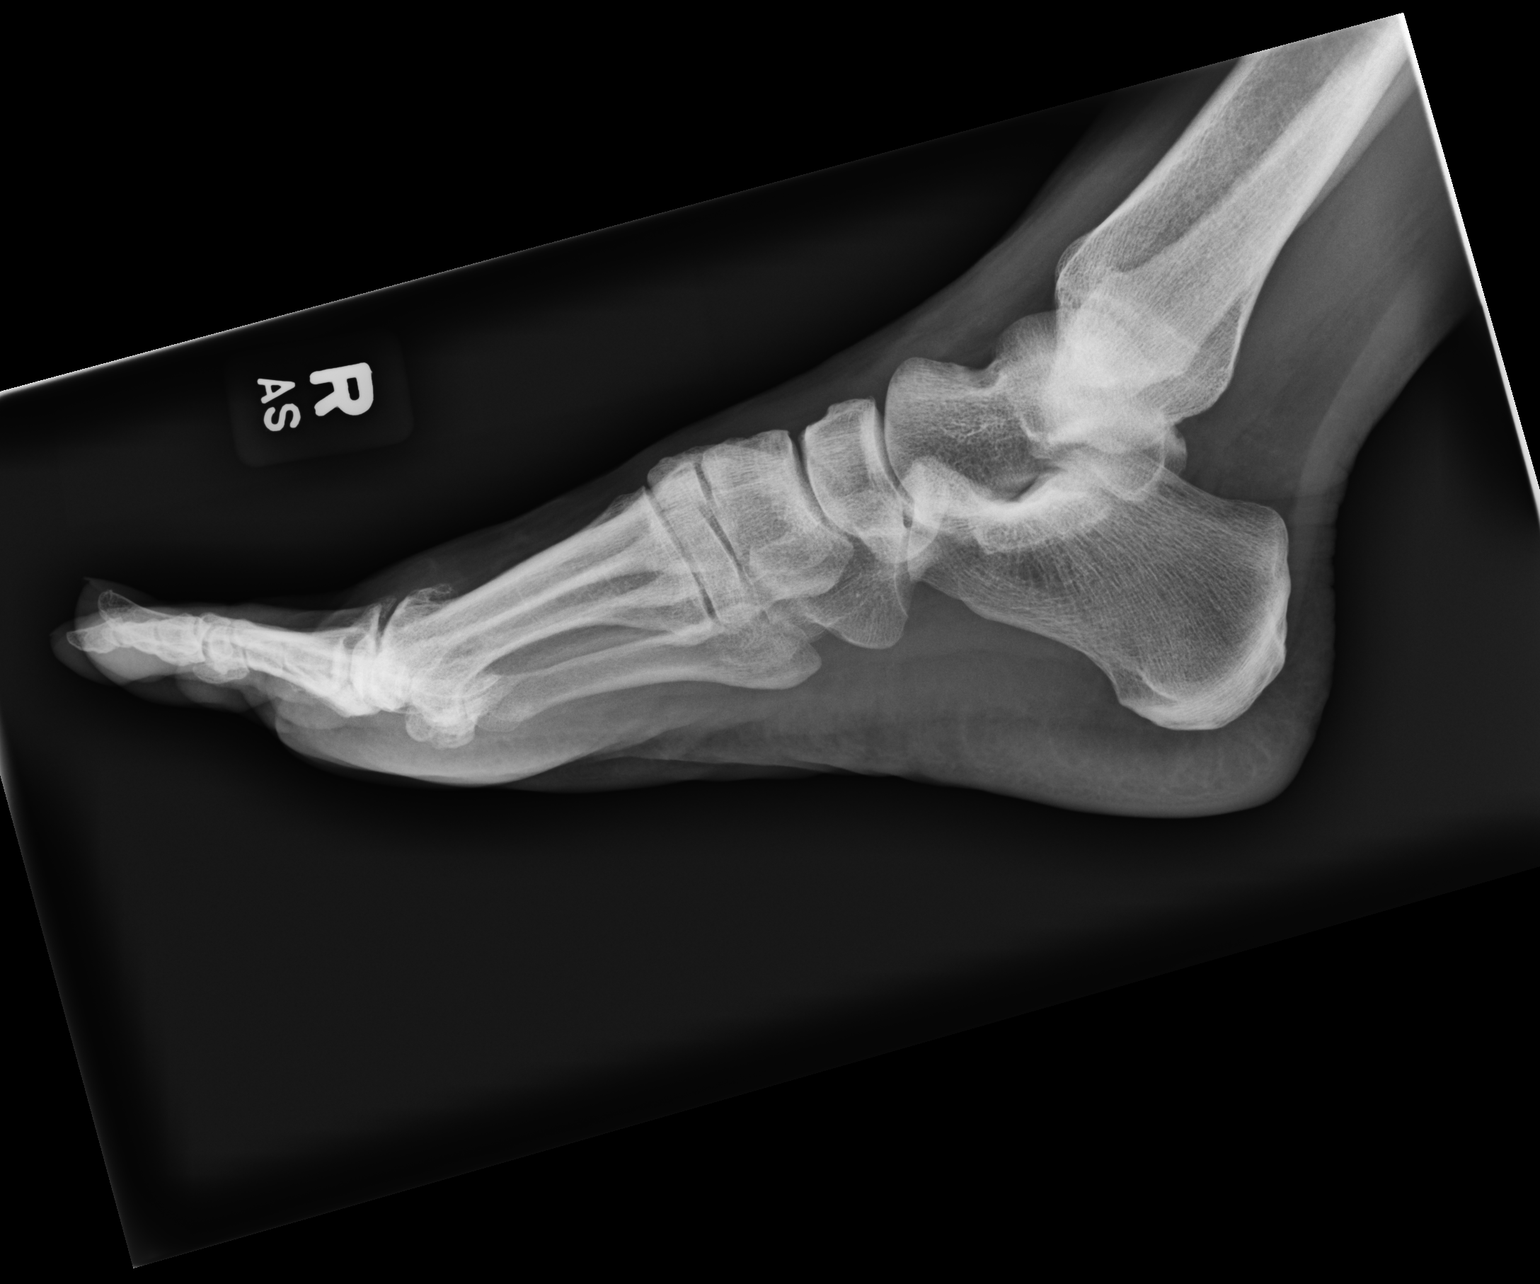

[3 of 3 positions shown; findings below may reference images not displayed]

FINDINGS: Degenerative changes of the first MTP joint are noted. No acute
fracture or dislocation is seen. No soft tissue abnormality is
noted.
IMPRESSION: No acute abnormality noted. Mild degenerative changes at the first
MTP joint.

## 2022-11-24 DIAGNOSIS — L578 Other skin changes due to chronic exposure to nonionizing radiation: Secondary | ICD-10-CM | POA: Diagnosis not present

## 2022-11-24 DIAGNOSIS — L309 Dermatitis, unspecified: Secondary | ICD-10-CM | POA: Diagnosis not present

## 2022-11-24 DIAGNOSIS — D225 Melanocytic nevi of trunk: Secondary | ICD-10-CM | POA: Diagnosis not present

## 2022-12-22 DIAGNOSIS — H401111 Primary open-angle glaucoma, right eye, mild stage: Secondary | ICD-10-CM | POA: Diagnosis not present

## 2023-01-26 DIAGNOSIS — H401111 Primary open-angle glaucoma, right eye, mild stage: Secondary | ICD-10-CM | POA: Diagnosis not present

## 2023-02-14 DIAGNOSIS — H43393 Other vitreous opacities, bilateral: Secondary | ICD-10-CM | POA: Diagnosis not present

## 2023-02-14 DIAGNOSIS — H2513 Age-related nuclear cataract, bilateral: Secondary | ICD-10-CM | POA: Diagnosis not present

## 2023-02-14 DIAGNOSIS — H401131 Primary open-angle glaucoma, bilateral, mild stage: Secondary | ICD-10-CM | POA: Diagnosis not present

## 2023-03-01 DIAGNOSIS — M1711 Unilateral primary osteoarthritis, right knee: Secondary | ICD-10-CM | POA: Diagnosis not present

## 2023-03-01 DIAGNOSIS — M25562 Pain in left knee: Secondary | ICD-10-CM | POA: Diagnosis not present

## 2023-03-01 DIAGNOSIS — M25561 Pain in right knee: Secondary | ICD-10-CM | POA: Diagnosis not present

## 2023-03-21 DIAGNOSIS — M25562 Pain in left knee: Secondary | ICD-10-CM | POA: Diagnosis not present

## 2023-03-21 DIAGNOSIS — M25561 Pain in right knee: Secondary | ICD-10-CM | POA: Diagnosis not present

## 2023-04-04 DIAGNOSIS — H2513 Age-related nuclear cataract, bilateral: Secondary | ICD-10-CM | POA: Diagnosis not present

## 2023-04-04 DIAGNOSIS — H401132 Primary open-angle glaucoma, bilateral, moderate stage: Secondary | ICD-10-CM | POA: Diagnosis not present

## 2023-04-04 DIAGNOSIS — H43393 Other vitreous opacities, bilateral: Secondary | ICD-10-CM | POA: Diagnosis not present

## 2023-04-09 DIAGNOSIS — M25562 Pain in left knee: Secondary | ICD-10-CM | POA: Diagnosis not present

## 2023-04-19 DIAGNOSIS — M25562 Pain in left knee: Secondary | ICD-10-CM | POA: Diagnosis not present

## 2023-05-04 DIAGNOSIS — M2242 Chondromalacia patellae, left knee: Secondary | ICD-10-CM | POA: Diagnosis not present

## 2023-05-04 DIAGNOSIS — S83232D Complex tear of medial meniscus, current injury, left knee, subsequent encounter: Secondary | ICD-10-CM | POA: Diagnosis not present

## 2023-05-05 DIAGNOSIS — M25562 Pain in left knee: Secondary | ICD-10-CM | POA: Diagnosis not present

## 2023-05-05 DIAGNOSIS — M6281 Muscle weakness (generalized): Secondary | ICD-10-CM | POA: Diagnosis not present

## 2023-05-13 DIAGNOSIS — M25562 Pain in left knee: Secondary | ICD-10-CM | POA: Diagnosis not present

## 2023-05-13 DIAGNOSIS — M6281 Muscle weakness (generalized): Secondary | ICD-10-CM | POA: Diagnosis not present

## 2023-05-18 ENCOUNTER — Ambulatory Visit: Payer: BC Managed Care – PPO | Admitting: Physician Assistant

## 2023-05-23 DIAGNOSIS — M25562 Pain in left knee: Secondary | ICD-10-CM | POA: Diagnosis not present

## 2023-05-23 DIAGNOSIS — M6281 Muscle weakness (generalized): Secondary | ICD-10-CM | POA: Diagnosis not present

## 2023-05-26 DIAGNOSIS — S83232D Complex tear of medial meniscus, current injury, left knee, subsequent encounter: Secondary | ICD-10-CM | POA: Diagnosis not present

## 2023-05-26 DIAGNOSIS — Z1231 Encounter for screening mammogram for malignant neoplasm of breast: Secondary | ICD-10-CM | POA: Diagnosis not present

## 2023-05-26 NOTE — Progress Notes (Deleted)
  Electrophysiology Office Note:   Date:  05/26/2023  ID:  Debra Hardin 11-Jan-1961, MRN [id number]  Primary Cardiologist: None Electrophysiologist: Debra Jorja Loa, MD  {Click to update primary MD,subspecialty MD or APP then REFRESH:1}    History of Present Illness:   Debra Hardin is a 62 y.o. female with h/o persistent AF seen today for routine electrophysiology followup.   Since last being seen in our clinic the patient reports doing ***.  she denies chest pain, palpitations, dyspnea, PND, orthopnea, nausea, vomiting, dizziness, syncope, edema, weight gain, or early satiety.   Review of systems complete and found to be negative unless listed in HPI.   EP Information / Studies Reviewed:    EKG is ordered today. Personal review as below.       Echo 2019 LVEF 60-65%, mild RAE, no obvious PFO  Physical Exam:   VS:  There were no vitals taken for this visit.   Wt Readings from Last 3 Encounters:  05/19/22 180 lb (81.6 kg)  11/18/21 183 lb 12.8 oz (83.4 kg)  08/13/21 180 lb (81.6 kg)     GEN: Well nourished, well developed in no acute distress NECK: No JVD; No carotid bruits CARDIAC: {EPRHYTHM:28826}, no murmurs, rubs, gallops RESPIRATORY:  Clear to auscultation without rales, wheezing or rhonchi  ABDOMEN: Soft, non-tender, non-distended EXTREMITIES:  No edema; No deformity   ASSESSMENT AND PLAN:    Persistent AF EKG today shows *** Continue multaq 400 mg BID Not on OAC with CHA2DS2VASc of 1.  She understands she has other medication and ablation options were she to have more AF.   She is currently satisfied with her management. ***  {Click here to Review PMH, Prob List, Meds, Allergies, SHx, FHx  :1}   Follow up with {GLOVF:64332} {EPFOLLOW RJ:18841}  Signed, Graciella Freer, PA-C

## 2023-05-27 ENCOUNTER — Ambulatory Visit: Payer: BC Managed Care – PPO | Admitting: Student

## 2023-05-27 DIAGNOSIS — M6281 Muscle weakness (generalized): Secondary | ICD-10-CM | POA: Diagnosis not present

## 2023-05-27 DIAGNOSIS — I4819 Other persistent atrial fibrillation: Secondary | ICD-10-CM

## 2023-05-27 DIAGNOSIS — M25562 Pain in left knee: Secondary | ICD-10-CM | POA: Diagnosis not present

## 2023-05-27 NOTE — Progress Notes (Unsigned)
  Electrophysiology Office Note:   Date:  05/30/2023  ID:  Debra Hardin [birth year], MRN [id number]  Primary Cardiologist: None Electrophysiologist: Debra Jorja Loa, MD      History of Present Illness:   Debra Hardin is a 62 y.o. female with h/o persistent AF seen today for routine electrophysiology followup.   Since last being seen in our clinic the patient reports doing very well. No breakthrough AF. Currently,  she denies chest pain, palpitations, dyspnea, PND, orthopnea, nausea, vomiting, dizziness, syncope, edema, weight gain, or early satiety.   Review of systems complete and found to be negative unless listed in HPI.   EP Information / Studies Reviewed:    EKG is ordered today. Personal review as below.  EKG Interpretation Date/Time:  Monday May 30 2023 10:34:18 EST Ventricular Rate:  66 PR Interval:  192 QRS Duration:  86 QT Interval:  406 QTC Calculation: 425 R Axis:   -19  Text Interpretation: Normal sinus rhythm When compared with ECG of 18-Nov-2021 11:44, No significant change was found Confirmed by Maxine Glenn [phone]) on 05/30/2023 10:40:41 AM    Echo 2019 LVEF 60-65%, mild RAE, no obvious PFO  Physical Exam:   VS:  BP 124/74 (BP Location: Left Arm, Patient Position: Sitting, Cuff Size: Normal)   Pulse 66   Ht 5\' 6"  (1.676 m)   Wt 185 lb (83.9 kg)   SpO2 98%   BMI 29.86 kg/m    Wt Readings from Last 3 Encounters:  05/30/23 185 lb (83.9 kg)  05/19/22 180 lb (81.6 kg)  11/18/21 183 lb 12.8 oz (83.4 kg)     GEN: Well nourished, well developed in no acute distress NECK: No JVD; No carotid bruits CARDIAC: Regular rate and rhythm, no murmurs, rubs, gallops RESPIRATORY:  Clear to auscultation without rales, wheezing or rhonchi  ABDOMEN: Soft, non-tender, non-distended EXTREMITIES:  No edema; No deformity   ASSESSMENT AND PLAN:    Persistent AF EKG today shows NSR with stable intervals.  Continue multaq 400 mg BID Not on OAC with  CHA2DS2VASc of 1.  She understands she has other medication and ablation options were she to have more AF.   She is currently satisfied with her management.   Follow up with Dr. Elberta Fortis in 12 months. Sooner with issues.   Signed, Graciella Freer, PA-C

## 2023-05-30 ENCOUNTER — Encounter: Payer: Self-pay | Admitting: Student

## 2023-05-30 ENCOUNTER — Ambulatory Visit: Payer: BC Managed Care – PPO | Attending: Student | Admitting: Student

## 2023-05-30 VITALS — BP 124/74 | HR 66 | Ht 66.0 in | Wt 185.0 lb

## 2023-05-30 DIAGNOSIS — I4819 Other persistent atrial fibrillation: Secondary | ICD-10-CM | POA: Diagnosis not present

## 2023-05-30 MED ORDER — DILTIAZEM HCL 30 MG PO TABS: ORAL_TABLET | ORAL | 1 refills | Status: AC

## 2023-05-30 MED ORDER — MULTAQ 400 MG PO TABS: 400.0000 mg | ORAL_TABLET | Freq: Two times a day (BID) | ORAL | 3 refills | Status: DC

## 2023-05-30 NOTE — Patient Instructions (Signed)
Medication Instructions:  Your physician recommends that you continue on your current medications as directed. Please refer to the Current Medication list given to you today.  *If you need a refill on your cardiac medications before your next appointment, please call your pharmacy*  Lab Work: CMET-TODAY If you have labs (blood work) drawn today and your tests are completely normal, you will receive your results only by: MyChart Message (if you have MyChart) OR A paper copy in the mail If you have any lab test that is abnormal or we need to change your treatment, we will call you to review the results.  Follow-Up: At [facility], you and your health needs are our priority.  As part of our continuing mission to provide you with exceptional heart care, we have created designated Provider Care Teams.  These Care Teams include your primary Cardiologist (physician) and Advanced Practice Providers (APPs -  Physician Assistants and Nurse Practitioners) who all work together to provide you with the care you need, when you need it.  Your next appointment:   1 year(s)  Provider:   Loman Brooklyn, MD

## 2023-05-31 LAB — COMPREHENSIVE METABOLIC PANEL
ALT: 14 [IU]/L (ref 0–32)
AST: 15 [IU]/L (ref 0–40)
Albumin: 4.8 g/dL (ref 3.9–4.9)
Alkaline Phosphatase: 58 [IU]/L (ref 44–121)
BUN/Creatinine Ratio: 16 (ref 12–28)
BUN: 15 mg/dL (ref 8–27)
Bilirubin Total: 0.4 mg/dL (ref 0.0–1.2)
CO2: 26 mmol/L (ref 20–29)
Calcium: 9.8 mg/dL (ref 8.7–10.3)
Chloride: 99 mmol/L (ref 96–106)
Creatinine, Ser: 0.93 mg/dL (ref 0.57–1.00)
Globulin, Total: 2.3 g/dL (ref 1.5–4.5)
Glucose: 74 mg/dL (ref 70–99)
Potassium: 4.5 mmol/L (ref 3.5–5.2)
Sodium: 140 mmol/L (ref 134–144)
Total Protein: 7.1 g/dL (ref 6.0–8.5)
eGFR: 69 mL/min/{1.73_m2} (ref >59)

## 2023-06-06 DIAGNOSIS — M6281 Muscle weakness (generalized): Secondary | ICD-10-CM | POA: Diagnosis not present

## 2023-06-06 DIAGNOSIS — M25562 Pain in left knee: Secondary | ICD-10-CM | POA: Diagnosis not present

## 2023-07-11 DIAGNOSIS — M6281 Muscle weakness (generalized): Secondary | ICD-10-CM | POA: Diagnosis not present

## 2023-07-11 DIAGNOSIS — M25562 Pain in left knee: Secondary | ICD-10-CM | POA: Diagnosis not present

## 2023-07-12 DIAGNOSIS — J029 Acute pharyngitis, unspecified: Secondary | ICD-10-CM | POA: Diagnosis not present

## 2023-07-12 DIAGNOSIS — H6993 Unspecified Eustachian tube disorder, bilateral: Secondary | ICD-10-CM | POA: Diagnosis not present

## 2023-07-12 DIAGNOSIS — R051 Acute cough: Secondary | ICD-10-CM | POA: Diagnosis not present

## 2023-07-18 DIAGNOSIS — M25562 Pain in left knee: Secondary | ICD-10-CM | POA: Diagnosis not present

## 2023-07-18 DIAGNOSIS — M6281 Muscle weakness (generalized): Secondary | ICD-10-CM | POA: Diagnosis not present

## 2023-07-26 DIAGNOSIS — M25562 Pain in left knee: Secondary | ICD-10-CM | POA: Diagnosis not present

## 2023-07-26 DIAGNOSIS — N951 Menopausal and female climacteric states: Secondary | ICD-10-CM | POA: Diagnosis not present

## 2023-07-26 DIAGNOSIS — M6281 Muscle weakness (generalized): Secondary | ICD-10-CM | POA: Diagnosis not present

## 2023-07-26 DIAGNOSIS — F526 Dyspareunia not due to a substance or known physiological condition: Secondary | ICD-10-CM | POA: Diagnosis not present

## 2023-08-02 DIAGNOSIS — S83232D Complex tear of medial meniscus, current injury, left knee, subsequent encounter: Secondary | ICD-10-CM | POA: Diagnosis not present

## 2023-08-02 DIAGNOSIS — M2242 Chondromalacia patellae, left knee: Secondary | ICD-10-CM | POA: Diagnosis not present

## 2023-08-02 DIAGNOSIS — M25562 Pain in left knee: Secondary | ICD-10-CM | POA: Diagnosis not present

## 2023-08-02 DIAGNOSIS — M6281 Muscle weakness (generalized): Secondary | ICD-10-CM | POA: Diagnosis not present

## 2023-08-03 ENCOUNTER — Telehealth: Payer: Self-pay | Admitting: *Deleted

## 2023-08-03 NOTE — Telephone Encounter (Signed)
   Pre-operative Risk Assessment    Patient Name: Debra Hardin  DOB: 01/19/1961 MRN: [id number]   Date of last office visit: 05/30/23 Otilio Saber, [facility] Date of next office visit: NONE   Request for Surgical Clearance    Procedure:  LEFT KNEE SCOPE, PMM  Date of Surgery:  Clearance TBD                                Surgeon:  DR. Malon Kindle  Surgeon's Group or Practice Name:  [facility] Phone number:  [phone] ATTN: KERRI MAZE Fax number:  [phone]   Type of Clearance Requested:   - Medical ; NO MEDICATIONS LISTED AS NEEDING TO BE HELD   Type of Anesthesia:   CHOICE   Additional requests/questions:    Elpidio Anis   08/03/2023, 6:06 PM

## 2023-08-04 ENCOUNTER — Telehealth: Payer: Self-pay | Admitting: *Deleted

## 2023-08-04 NOTE — Telephone Encounter (Signed)
   Name: Debra Hardin  DOB: 06-10-1961  MRN: [id number]  Primary Cardiologist: None   Preoperative team, please contact this patient and set up a phone call appointment for further preoperative risk assessment. Please obtain consent and complete medication review. Thank you for your help.  I confirm that guidance regarding antiplatelet and oral anticoagulation therapy has been completed and, if necessary, noted below.  None requested   I also confirmed the patient resides in the state of [place]. As per [facility] Medical Board telemedicine laws, the patient must reside in the state in which the provider is licensed.   Ronney Asters, NP 08/04/2023, 10:05 AM [facility]

## 2023-08-04 NOTE — Telephone Encounter (Signed)
Pt has been scheduled tele preop appt 08/15/23. Med rec and consent are done. Pt was hoping to be able to have her surgery 08/17/23. I stated the clearance request came to our office as TBD. Pt is upset and does not understand why she needs this tele appt. I explained the protocol to the pt who verbalized understanding. Pt asked if I could put her on a waitlist as well I said yes.   I assured her that I will update Debra Hardin that she has tele preop appt 08/15/23, which is our 1st available.

## 2023-08-04 NOTE — Telephone Encounter (Signed)
Pt has been scheduled tele preop appt 08/15/23. Med rec and consent are done. Pt was hoping to be able to have her surgery 08/17/23. I stated the clearance request came to our office as TBD. Pt is upset and does not understand why she needs this tele appt. I explained the protocol to the pt who verbalized understanding. Pt asked if I could put her on a waitlist as well I said yes.   I assured her that I will update Lynnea Ferrier that she has tele preop appt 08/15/23, which is our 1st available.      Patient Consent for Virtual Visit        Debra Hardin has provided verbal consent on 08/04/2023 for a virtual visit (video or telephone).   CONSENT FOR VIRTUAL VISIT FOR:  Debra Hardin  By participating in this virtual visit I agree to the following:  I hereby voluntarily request, consent and authorize [facility] and its employed or contracted physicians, physician assistants, nurse practitioners or other licensed health care professionals (the Practitioner), to provide me with telemedicine health care services (the "Services") as deemed necessary by the treating Practitioner. I acknowledge and consent to receive the Services by the Practitioner via telemedicine. I understand that the telemedicine visit will involve communicating with the Practitioner through live audiovisual communication technology and the disclosure of certain medical information by electronic transmission. I acknowledge that I have been given the opportunity to request an in-person assessment or other available alternative prior to the telemedicine visit and am voluntarily participating in the telemedicine visit.  I understand that I have the right to withhold or withdraw my consent to the use of telemedicine in the course of my care at any time, without affecting my right to future care or treatment, and that the Practitioner or I may terminate the telemedicine visit at any time. I understand that I have the right to  inspect all information obtained and/or recorded in the course of the telemedicine visit and may receive copies of available information for a reasonable fee.  I understand that some of the potential risks of receiving the Services via telemedicine include:  Delay or interruption in medical evaluation due to technological equipment failure or disruption; Information transmitted may not be sufficient (e.g. poor resolution of images) to allow for appropriate medical decision making by the Practitioner; and/or  In rare instances, security protocols could fail, causing a breach of personal health information.  Furthermore, I acknowledge that it is my responsibility to provide information about my medical history, conditions and care that is complete and accurate to the best of my ability. I acknowledge that Practitioner's advice, recommendations, and/or decision may be based on factors not within their control, such as incomplete or inaccurate data provided by me or distortions of diagnostic images or specimens that may result from electronic transmissions. I understand that the practice of medicine is not an exact science and that Practitioner makes no warranties or guarantees regarding treatment outcomes. I acknowledge that a copy of this consent can be made available to me via my patient portal [facility] MyChart), or I can request a printed copy by calling the office of [facility].    I understand that my insurance will be billed for this visit.   I have read or had this consent read to me. I understand the contents of this consent, which adequately explains the benefits and risks of the Services being provided via telemedicine.  I have been provided  ample opportunity to ask questions regarding this consent and the Services and have had my questions answered to my satisfaction. I give my informed consent for the services to be provided through the use of telemedicine in my medical  care

## 2023-08-15 ENCOUNTER — Ambulatory Visit: Payer: BC Managed Care – PPO | Attending: Nurse Practitioner | Admitting: Nurse Practitioner

## 2023-08-15 DIAGNOSIS — Z01810 Encounter for preprocedural cardiovascular examination: Secondary | ICD-10-CM | POA: Diagnosis not present

## 2023-08-15 NOTE — Progress Notes (Signed)
 Virtual Visit via Telephone Note   Because of Debra Hardin's co-morbid illnesses, she is at least at moderate risk for complications without adequate follow up.  This format is felt to be most appropriate for this patient at this time.  The patient did not have access to video technology/had technical difficulties with video requiring transitioning to audio format only (telephone).  All issues noted in this document were discussed and addressed.  No physical exam could be performed with this format.  Please refer to the patient's chart for her consent to telehealth for [facility].  Evaluation Performed:  Preoperative cardiovascular risk assessment _____________   Date:  08/15/2023   Patient ID:  Debra Hardin, DOB September 19, 1960, MRN [id number] Patient Location:  Home Provider location:   Office  Primary Care Provider:  Arva Lathe, MD Primary Cardiologist:  None  Chief Complaint / Patient Profile   63 y.o. y/o female with a h/o persistent atrial fibrillation on chronic anticoagulation who is pending left knee scope with Dr. Brunilda Capra, date TBD and presents today for telephonic preoperative cardiovascular risk assessment.  History of Present Illness    Debra Hardin is a 63 y.o. female who presents via audio/video conferencing for a telehealth visit today.  Pt was last seen in cardiology clinic on 05/30/23 by Michaelle Adolphus, PA.  At that time Debra Hardin was doing well.  The patient is now pending procedure as outlined above. Since her last visit, she denies chest pain, shortness of breath, lower extremity edema, fatigue, palpitations, diaphoresis, weakness, presyncope, syncope, orthopnea, and PND. She is somewhat limited by knee pain but remains active and is able to achieve > 4 METS activity without concerning cardiac symptoms.   Past Medical History    Past Medical History:  Diagnosis Date   Anxiety    Dr. [place] Braden   Asthma    Bronchitis     Depression    HSV-2 (herpes simplex virus 2) infection    Hyperlipidemia    Insomnia    Lateral epicondylitis right; 09/2014   Dr. Rendell Carrel   Multiple thyroid  nodules 03/2014   more prominent on L>R; h/o benign bx. f/u 1 yr   OSA (obstructive sleep apnea)    CPAP (uses sporadically)   Trochanteric bursitis 01/2015   Vaginal dryness    Past Surgical History:  Procedure Laterality Date   CARDIOVERSION N/A 02/21/2018   Procedure: CARDIOVERSION;  Surgeon: Jacqueline Matsu, MD;  Location: [facility] ENDOSCOPY;  Service: Cardiovascular;  Laterality: N/A;   CARDIOVERSION N/A 03/08/2018   Procedure: CARDIOVERSION;  Surgeon: Sheryle Donning, MD;  Location: [facility] ENDOSCOPY;  Service: Cardiovascular;  Laterality: N/A;   COMBINED HYSTERECTOMY VAGINAL / OOPHORECTOMY / A&P REPAIR  2008   pt states she has her ovaries   CRYOTHERAPY     DILATION AND CURETTAGE OF UTERUS     HERNIA REPAIR  age 41   LIH   HYSTEROTOMY     KNEE SURGERY Right    twice--Dr. Alfredo Ano, Dr. [place] (25 years ago)    Allergies  Allergies  Allergen Reactions   Codeine Nausea And Vomiting   Venlafaxine Hcl Other (See Comments)    Had trouble sleeping,     Home Medications    Prior to Admission medications   Medication Sig Start Date End Date Taking? Authorizing Provider  albuterol  (PROVENTIL  HFA;VENTOLIN  HFA) 108 (90 BASE) MCG/ACT inhaler Inhale 2 puffs into the lungs every 6 (six) hours as needed for wheezing or shortness of  breath. 10/30/14   Schoenhoff, Alejandro Amour, MD  cetirizine (ZYRTEC) 10 MG tablet Take 10 mg by mouth at bedtime.     [provider]  diltiazem  (CARDIZEM ) 30 MG tablet Take 1 tablet every 4 hours AS NEEDED for heart rate >100 as long as top blood pressure >100. 05/30/23   Tillery, Dellia Ferguson, PA-C  dorzolamide (TRUSOPT) 2 % ophthalmic solution Place 1 drop into the left eye 2 (two) times daily. 05/05/22   [provider]  dronedarone  (MULTAQ ) 400 MG tablet Take 1 tablet (400 mg total)  by mouth 2 (two) times daily with a meal. 05/30/23   Tillery, Dellia Ferguson, PA-C  estradiol  (VIVELLE -DOT) 0.05 MG/24HR patch PLACE ONTO SKIN AND CHANGE TWICE A WEEK Patient taking differently: Change when symptoms need addressing, currently wearing 5 - 6 days 07/02/15   Roosvelt Colla, MD  fluticasone (FLONASE) 50 MCG/ACT nasal spray Place 1 spray into both nostrils as needed.    [provider]  latanoprost (XALATAN) 0.005 % ophthalmic solution Place 1 drop into both eyes every morning. Patient not taking: Reported on 08/04/2023 02/10/21   [provider]  montelukast  (SINGULAIR ) 10 MG tablet SMARTSIG:1 Tablet(s) By Mouth Every Evening 01/18/21   [provider]  traZODone  (DESYREL ) 50 MG tablet Take 50 mg by mouth at bedtime. 06/22/19   [provider]  valACYclovir  (VALTREX ) 500 MG tablet Take one daily 06/27/15   Roosvelt Colla, MD    Physical Exam    Vital Signs:  Debra Hardin does not have vital signs available for review today.  Given telephonic nature of communication, physical exam is limited. AAOx3. NAD. Normal affect.  Speech and respirations are unlabored.  Accessory Clinical Findings    None  Assessment & Plan    1.  Preoperative Cardiovascular Risk Assessment:According to the Revised Cardiac Risk Index (RCRI), her Perioperative Risk of Major Cardiac Event is (%): 0.4. Her Functional Capacity in METs is: 9.89 according to the Duke Activity Status Index (DASI). The patient is doing well from a cardiac perspective. Therefore, based on ACC/AHA guidelines, the patient would be at acceptable risk for the planned procedure without further cardiovascular testing.   The patient was advised that if she develops new symptoms prior to surgery to contact our office to arrange for a follow-up visit, and she verbalized understanding.  No request to hold cardiac medications.   A copy of this note will be routed to requesting surgeon.  Time:   Today, I have  spent 10 minutes with the patient with telehealth technology discussing medical history, symptoms, and management plan.     Gerldine Koch, NP-C  08/15/2023, 2:22 PM [address] Office [phone] Fax [phone]

## 2023-08-16 DIAGNOSIS — H43393 Other vitreous opacities, bilateral: Secondary | ICD-10-CM | POA: Diagnosis not present

## 2023-08-16 DIAGNOSIS — H2511 Age-related nuclear cataract, right eye: Secondary | ICD-10-CM | POA: Diagnosis not present

## 2023-08-16 DIAGNOSIS — H401132 Primary open-angle glaucoma, bilateral, moderate stage: Secondary | ICD-10-CM | POA: Diagnosis not present

## 2023-09-01 ENCOUNTER — Other Ambulatory Visit (HOSPITAL_COMMUNITY): Payer: Self-pay

## 2023-09-01 ENCOUNTER — Telehealth: Payer: Self-pay | Admitting: Pharmacy Technician

## 2023-09-01 NOTE — Telephone Encounter (Signed)
 Ran test claim for multaq. For a 30 day supply and the co-pay is 35.00 . PA is not needed at this time.   This test claim was processed through [facility]- copay amounts may vary at other pharmacies due to pharmacy/plan contracts, or as the patient moves through the different stages of their insurance plan.

## 2023-09-19 DIAGNOSIS — J4520 Mild intermittent asthma, uncomplicated: Secondary | ICD-10-CM | POA: Diagnosis not present

## 2023-09-19 DIAGNOSIS — I4819 Other persistent atrial fibrillation: Secondary | ICD-10-CM | POA: Diagnosis not present

## 2023-09-19 DIAGNOSIS — J019 Acute sinusitis, unspecified: Secondary | ICD-10-CM | POA: Diagnosis not present

## 2023-09-22 DIAGNOSIS — I4819 Other persistent atrial fibrillation: Secondary | ICD-10-CM | POA: Diagnosis not present

## 2023-09-22 DIAGNOSIS — E559 Vitamin D deficiency, unspecified: Secondary | ICD-10-CM | POA: Diagnosis not present

## 2023-09-22 DIAGNOSIS — J4520 Mild intermittent asthma, uncomplicated: Secondary | ICD-10-CM | POA: Diagnosis not present

## 2023-09-22 DIAGNOSIS — E785 Hyperlipidemia, unspecified: Secondary | ICD-10-CM | POA: Diagnosis not present

## 2023-09-22 DIAGNOSIS — Z23 Encounter for immunization: Secondary | ICD-10-CM | POA: Diagnosis not present

## 2023-09-22 DIAGNOSIS — Z0000 Encounter for general adult medical examination without abnormal findings: Secondary | ICD-10-CM | POA: Diagnosis not present

## 2023-09-28 DIAGNOSIS — M2242 Chondromalacia patellae, left knee: Secondary | ICD-10-CM | POA: Diagnosis not present

## 2023-09-28 DIAGNOSIS — M65962 Unspecified synovitis and tenosynovitis, left lower leg: Secondary | ICD-10-CM | POA: Diagnosis not present

## 2023-09-28 DIAGNOSIS — S83272A Complex tear of lateral meniscus, current injury, left knee, initial encounter: Secondary | ICD-10-CM | POA: Diagnosis not present

## 2023-09-28 DIAGNOSIS — M94262 Chondromalacia, left knee: Secondary | ICD-10-CM | POA: Diagnosis not present

## 2023-09-28 DIAGNOSIS — S83282A Other tear of lateral meniscus, current injury, left knee, initial encounter: Secondary | ICD-10-CM | POA: Diagnosis not present

## 2023-09-28 DIAGNOSIS — X58XXXA Exposure to other specified factors, initial encounter: Secondary | ICD-10-CM | POA: Diagnosis not present

## 2023-09-28 DIAGNOSIS — Y999 Unspecified external cause status: Secondary | ICD-10-CM | POA: Diagnosis not present

## 2023-09-28 DIAGNOSIS — G8918 Other acute postprocedural pain: Secondary | ICD-10-CM | POA: Diagnosis not present

## 2023-09-28 DIAGNOSIS — S83232A Complex tear of medial meniscus, current injury, left knee, initial encounter: Secondary | ICD-10-CM | POA: Diagnosis not present

## 2023-09-28 DIAGNOSIS — M948X6 Other specified disorders of cartilage, lower leg: Secondary | ICD-10-CM | POA: Diagnosis not present

## 2023-10-17 DIAGNOSIS — M25562 Pain in left knee: Secondary | ICD-10-CM | POA: Diagnosis not present

## 2023-10-17 DIAGNOSIS — R262 Difficulty in walking, not elsewhere classified: Secondary | ICD-10-CM | POA: Diagnosis not present

## 2023-10-19 DIAGNOSIS — M06869 Other specified rheumatoid arthritis, unspecified knee: Secondary | ICD-10-CM | POA: Diagnosis not present

## 2023-10-20 DIAGNOSIS — H2512 Age-related nuclear cataract, left eye: Secondary | ICD-10-CM | POA: Diagnosis not present

## 2023-10-20 DIAGNOSIS — H401121 Primary open-angle glaucoma, left eye, mild stage: Secondary | ICD-10-CM | POA: Diagnosis not present

## 2023-10-24 DIAGNOSIS — M06869 Other specified rheumatoid arthritis, unspecified knee: Secondary | ICD-10-CM | POA: Diagnosis not present

## 2023-10-27 DIAGNOSIS — M06869 Other specified rheumatoid arthritis, unspecified knee: Secondary | ICD-10-CM | POA: Diagnosis not present

## 2023-10-31 DIAGNOSIS — M06869 Other specified rheumatoid arthritis, unspecified knee: Secondary | ICD-10-CM | POA: Diagnosis not present

## 2023-11-02 DIAGNOSIS — M06869 Other specified rheumatoid arthritis, unspecified knee: Secondary | ICD-10-CM | POA: Diagnosis not present

## 2023-11-03 DIAGNOSIS — H401112 Primary open-angle glaucoma, right eye, moderate stage: Secondary | ICD-10-CM | POA: Diagnosis not present

## 2023-11-03 DIAGNOSIS — H2511 Age-related nuclear cataract, right eye: Secondary | ICD-10-CM | POA: Diagnosis not present

## 2023-11-07 DIAGNOSIS — M06869 Other specified rheumatoid arthritis, unspecified knee: Secondary | ICD-10-CM | POA: Diagnosis not present

## 2023-11-08 DIAGNOSIS — Z713 Dietary counseling and surveillance: Secondary | ICD-10-CM | POA: Diagnosis not present

## 2023-11-11 DIAGNOSIS — M06869 Other specified rheumatoid arthritis, unspecified knee: Secondary | ICD-10-CM | POA: Diagnosis not present

## 2023-11-14 DIAGNOSIS — M06869 Other specified rheumatoid arthritis, unspecified knee: Secondary | ICD-10-CM | POA: Diagnosis not present

## 2023-11-16 DIAGNOSIS — F411 Generalized anxiety disorder: Secondary | ICD-10-CM | POA: Diagnosis not present

## 2023-11-17 DIAGNOSIS — M06869 Other specified rheumatoid arthritis, unspecified knee: Secondary | ICD-10-CM | POA: Diagnosis not present

## 2023-11-21 DIAGNOSIS — M06869 Other specified rheumatoid arthritis, unspecified knee: Secondary | ICD-10-CM | POA: Diagnosis not present

## 2023-11-24 DIAGNOSIS — M06869 Other specified rheumatoid arthritis, unspecified knee: Secondary | ICD-10-CM | POA: Diagnosis not present

## 2023-11-29 DIAGNOSIS — F411 Generalized anxiety disorder: Secondary | ICD-10-CM | POA: Diagnosis not present

## 2023-11-29 DIAGNOSIS — M06869 Other specified rheumatoid arthritis, unspecified knee: Secondary | ICD-10-CM | POA: Diagnosis not present

## 2023-11-30 DIAGNOSIS — Z713 Dietary counseling and surveillance: Secondary | ICD-10-CM | POA: Diagnosis not present

## 2023-12-02 DIAGNOSIS — M06869 Other specified rheumatoid arthritis, unspecified knee: Secondary | ICD-10-CM | POA: Diagnosis not present

## 2023-12-05 DIAGNOSIS — M06869 Other specified rheumatoid arthritis, unspecified knee: Secondary | ICD-10-CM | POA: Diagnosis not present

## 2023-12-08 DIAGNOSIS — M06869 Other specified rheumatoid arthritis, unspecified knee: Secondary | ICD-10-CM | POA: Diagnosis not present

## 2023-12-08 DIAGNOSIS — F411 Generalized anxiety disorder: Secondary | ICD-10-CM | POA: Diagnosis not present

## 2024-01-02 DIAGNOSIS — M06869 Other specified rheumatoid arthritis, unspecified knee: Secondary | ICD-10-CM | POA: Diagnosis not present

## 2024-01-05 DIAGNOSIS — M06869 Other specified rheumatoid arthritis, unspecified knee: Secondary | ICD-10-CM | POA: Diagnosis not present

## 2024-01-09 DIAGNOSIS — M06869 Other specified rheumatoid arthritis, unspecified knee: Secondary | ICD-10-CM | POA: Diagnosis not present

## 2024-01-11 DIAGNOSIS — F411 Generalized anxiety disorder: Secondary | ICD-10-CM | POA: Diagnosis not present

## 2024-01-19 DIAGNOSIS — M06869 Other specified rheumatoid arthritis, unspecified knee: Secondary | ICD-10-CM | POA: Diagnosis not present

## 2024-01-24 ENCOUNTER — Encounter: Payer: Self-pay | Admitting: Cardiology

## 2024-01-24 ENCOUNTER — Telehealth: Payer: Self-pay | Admitting: Cardiology

## 2024-01-24 NOTE — Telephone Encounter (Signed)
 Called and spoke with patient. Patient reports palpitations since last night. Denies CP and dizziness. Patient reports current blood pressure of 110/65 and pulse of 85 taken while on call. Patient endorses feeling more tired and getting winded with exertion. States she has been utilizing [employer] and it showed possible Afib and irregular pulse. Appointment with AFIB clinic scheduled for July 28th, 2025 at 3:30 pm. Notified patient that she can upload Kardia mobile strips to MyChart portal. Patient aware to use Cardizem  every four hours as needed for heart rate greater than 100 as long as systolic blood pressure is greater than 100. Advised patient to go to emergency room if she experiences worsening of symptoms.

## 2024-01-24 NOTE — Telephone Encounter (Signed)
 Patient c/o Palpitations: STAT if patient c/o lightheadedness, shortness of breath, or chest pain  How long have you had palpitations/irregular HR/ Afib? Are you having the symptoms now? Since last night   Are you currently experiencing lightheadedness, SOB or CP? No   Do you have a history of afib (atrial fibrillation) or irregular heart rhythm? Yes  Have you checked your BP or HR? (document readings if available): 127/80  Are you experiencing any other symptoms? Just very tired

## 2024-01-30 ENCOUNTER — Ambulatory Visit (HOSPITAL_COMMUNITY)
Admission: RE | Admit: 2024-01-30 | Discharge: 2024-01-30 | Disposition: A | Discharge status: Home / Self Care | Source: Ambulatory Visit | Attending: Physician Assistant | Admitting: Physician Assistant

## 2024-01-30 ENCOUNTER — Encounter (HOSPITAL_COMMUNITY): Payer: Self-pay | Admitting: Physician Assistant

## 2024-01-30 VITALS — BP 138/82 | HR 76 | Ht 66.0 in | Wt 187.2 lb

## 2024-01-30 DIAGNOSIS — I4819 Other persistent atrial fibrillation: Secondary | ICD-10-CM | POA: Diagnosis not present

## 2024-01-30 DIAGNOSIS — Z79899 Other long term (current) drug therapy: Secondary | ICD-10-CM | POA: Diagnosis not present

## 2024-01-30 DIAGNOSIS — Z5181 Encounter for therapeutic drug level monitoring: Secondary | ICD-10-CM

## 2024-01-30 MED ORDER — APIXABAN 5 MG PO TABS: 5.0000 mg | ORAL_TABLET | Freq: Two times a day (BID) | ORAL | 11 refills | Status: DC

## 2024-01-30 NOTE — Patient Instructions (Addendum)
 Cardioversion scheduled for: Tuesday August 19th   - Arrive at the [facility] ([address]  and check in with ADMITTING at 7am   - Do not eat or drink anything after midnight the night prior to your procedure.   - Take all your morning medication (except diabetic medications) with a sip of water prior to arrival.  - Do NOT miss any doses of your blood thinner - if you should miss a dose or take a dose more than 4 hours late -- please notify our office immediately.  - You will not be able to drive home after your procedure. Please ensure you have a responsible adult to drive you home. You will need someone with you for 24 hours post procedure.     - Expect to be in the procedural area approximately 2 hours.   - If you feel as if you go back into normal rhythm prior to scheduled cardioversion, please notify our office immediately.   If your procedure is canceled in the cardioversion suite you will be charged a cancellation fee.

## 2024-01-30 NOTE — Progress Notes (Signed)
 Primary Care Physician: Debra Charm, MD Primary Cardiologist: None Electrophysiologist: Debra Gladis Norton, MD  Referring Physician: Dr Debra Hardin Debra Hardin is a 63 y.o. female with a history of thyroid  nodules, OSA, HLD, atrial fibrillation who presents for follow up in the [facility].  The patient has been maintained on Multaq  for rhythm control. She presents today for follow up for atrial fibrillation and Multaq  monitoring. She states that she went into afib about 10 days ago. There were no specific triggers that she could identify. She has symptoms of palpitations and fatigue.   Today, she denies symptoms of chest pain, shortness of breath, orthopnea, PND, lower extremity edema, dizziness, presyncope, syncope, snoring, daytime somnolence, bleeding, or neurologic sequela. The patient is tolerating medications without difficulties and is otherwise without complaint today.    Atrial Fibrillation Risk Factors:  she does have symptoms or diagnosis of sleep apnea. she does not have a history of rheumatic fever. she does not have a history of alcohol use.   Atrial Fibrillation Management history:  Previous antiarrhythmic drugs: Multaq  Previous cardioversions: 2019 x 2 Previous ablations: none Anticoagulation history: Eliquis   ROS- All systems are reviewed and negative except as per the HPI above.  Past Medical History:  Diagnosis Date   Anxiety    Debra. Keneth Hardin   Asthma    Bronchitis    Depression    HSV-2 (herpes simplex virus 2) infection    Hyperlipidemia    Insomnia    Lateral epicondylitis right; 09/2014   Debra. Ludie Hardin   Multiple thyroid  nodules 03/2014   more prominent on L>R; h/o benign bx. f/u 1 yr   OSA (obstructive sleep apnea)    CPAP (uses sporadically)   Trochanteric bursitis 01/2015   Vaginal dryness     Current Outpatient Medications  Medication Sig Dispense Refill   albuterol  (PROVENTIL  HFA;VENTOLIN   HFA) 108 (90 BASE) MCG/ACT inhaler Inhale 2 puffs into the lungs every 6 (six) hours as needed for wheezing or shortness of breath. 1 Inhaler 1   cetirizine (ZYRTEC) 10 MG tablet Take 10 mg by mouth at bedtime.      diltiazem  (CARDIZEM ) 30 MG tablet Take 1 tablet every 4 hours AS NEEDED for heart rate >100 as long as top blood pressure >100. 45 tablet 1   dorzolamide (TRUSOPT) 2 % ophthalmic solution Place 1 drop into the left eye 2 (two) times daily.     dronedarone  (MULTAQ ) 400 MG tablet Take 1 tablet (400 mg total) by mouth 2 (two) times daily with a meal. 180 tablet 3   estradiol  (VIVELLE -DOT) 0.05 MG/24HR patch PLACE ONTO SKIN AND CHANGE TWICE A WEEK (Patient taking differently: Change when symptoms need addressing, currently wearing 5 - 6 days) 8 patch 4   fluticasone (FLONASE) 50 MCG/ACT nasal spray Place 1 spray into both nostrils as needed.     montelukast  (SINGULAIR ) 10 MG tablet SMARTSIG:1 Tablet(s) By Mouth Every Evening     traZODone  (DESYREL ) 50 MG tablet Take 50 mg by mouth at bedtime.     valACYclovir  (VALTREX ) 500 MG tablet Take one daily 90 tablet 1   latanoprost (XALATAN) 0.005 % ophthalmic solution Place 1 drop into both eyes every morning. (Patient not taking: Reported on 08/04/2023)     No current facility-administered medications for this encounter.    Physical Exam: There were no vitals taken for this visit.  GEN: Well nourished, well developed in no acute distress NECK: No JVD CARDIAC:  Irregularly irregular rate and rhythm, no murmurs, rubs, gallops RESPIRATORY:  Clear to auscultation without rales, wheezing or rhonchi  ABDOMEN: Soft, non-tender, non-distended EXTREMITIES:  No edema; No deformity   Wt Readings from Last 3 Encounters:  05/30/23 83.9 kg  05/19/22 81.6 kg  11/18/21 83.4 kg     EKG today demonstrates  Afib Vent. rate 76 BPM PR interval * ms QRS duration 80 ms QT/QTcB 370/416 ms   Echo 01/27/18 demonstrated  - Procedure narrative:  Transthoracic echocardiography. Image    quality was adequate. The study was technically difficult.  - Left ventricle: The cavity size was normal. Systolic function was    normal. The estimated ejection fraction was in the range of 60%    to 65%. Wall motion was normal; there were no regional wall    motion abnormalities. The study is not technically sufficient to    allow evaluation of LV diastolic function.  - Right atrium: The atrium was mildly dilated.  - Atrial septum: Atrial septal aneurysm with marked bowing of the    mid and superior aspect of the septum to the right. No obvious    PFO    Bubble study not done.    CHA2DS2-VASc Score = 1  The patient's score is based upon: CHF History: 0 HTN History: 0 Diabetes History: 0 Stroke History: 0 Vascular Disease History: 0 Age Score: 0 Gender Score: 1       ASSESSMENT AND PLAN: Persistent Atrial Fibrillation (ICD10:  I48.19) The patient's CHA2DS2-VASc score is 1, indicating a 0.6% annual risk of stroke.   Patient in persistent symptomatic afib. We discussed rhythm control options today. Debra start Eliquis  5 mg BID and arrange for DCCV after at least 3 weeks of anticoagulation.  Continue Multaq  400 mg BID for now. Long term, patient is interested in discussing PFA with Debra Hardin, Debra refer.  Check bmet/cbc today  High Risk Medication Monitoring (ICD 10: Z79.899) Intervals on ECG acceptable for dronedarone  monitoring.        Follow up with Debra Hardin to discuss ablation.    Informed Consent   Shared Decision Making/Informed Consent The risks (stroke, cardiac arrhythmias rarely resulting in the need for a temporary or permanent pacemaker, skin irritation or burns and complications associated with conscious sedation including aspiration, arrhythmia, respiratory failure and death), benefits (restoration of normal sinus rhythm) and alternatives of a direct current cardioversion were explained in detail to Debra Hardin and she  agrees to proceed.         [facility] [address] [place], [place] [address] [phone]

## 2024-01-30 NOTE — H&P (View-Only) (Signed)
 Primary Care Physician: Elliot Charm, MD Primary Cardiologist: None Electrophysiologist: Will Gladis Norton, MD  Referring Physician: Dr Kelsie Clarity Debra Hardin is a 63 y.o. female with a history of thyroid  nodules, OSA, HLD, atrial fibrillation who presents for follow up in the [facility].  The patient has been maintained on Multaq  for rhythm control. She presents today for follow up for atrial fibrillation and Multaq  monitoring. She states that she went into afib about 10 days ago. There were no specific triggers that she could identify. She has symptoms of palpitations and fatigue.   Today, she denies symptoms of chest pain, shortness of breath, orthopnea, PND, lower extremity edema, dizziness, presyncope, syncope, snoring, daytime somnolence, bleeding, or neurologic sequela. The patient is tolerating medications without difficulties and is otherwise without complaint today.    Atrial Fibrillation Risk Factors:  she does have symptoms or diagnosis of sleep apnea. she does not have a history of rheumatic fever. she does not have a history of alcohol use.   Atrial Fibrillation Management history:  Previous antiarrhythmic drugs: Multaq  Previous cardioversions: 2019 x 2 Previous ablations: none Anticoagulation history: Eliquis   ROS- All systems are reviewed and negative except as per the HPI above.  Past Medical History:  Diagnosis Date   Anxiety    Dr. Keneth Braden   Asthma    Bronchitis    Depression    HSV-2 (herpes simplex virus 2) infection    Hyperlipidemia    Insomnia    Lateral epicondylitis right; 09/2014   Dr. Ludie Littler   Multiple thyroid  nodules 03/2014   more prominent on L>R; h/o benign bx. f/u 1 yr   OSA (obstructive sleep apnea)    CPAP (uses sporadically)   Trochanteric bursitis 01/2015   Vaginal dryness     Current Outpatient Medications  Medication Sig Dispense Refill   albuterol  (PROVENTIL  HFA;VENTOLIN   HFA) 108 (90 BASE) MCG/ACT inhaler Inhale 2 puffs into the lungs every 6 (six) hours as needed for wheezing or shortness of breath. 1 Inhaler 1   cetirizine (ZYRTEC) 10 MG tablet Take 10 mg by mouth at bedtime.      diltiazem  (CARDIZEM ) 30 MG tablet Take 1 tablet every 4 hours AS NEEDED for heart rate >100 as long as top blood pressure >100. 45 tablet 1   dorzolamide (TRUSOPT) 2 % ophthalmic solution Place 1 drop into the left eye 2 (two) times daily.     dronedarone  (MULTAQ ) 400 MG tablet Take 1 tablet (400 mg total) by mouth 2 (two) times daily with a meal. 180 tablet 3   estradiol  (VIVELLE -DOT) 0.05 MG/24HR patch PLACE ONTO SKIN AND CHANGE TWICE A WEEK (Patient taking differently: Change when symptoms need addressing, currently wearing 5 - 6 days) 8 patch 4   fluticasone (FLONASE) 50 MCG/ACT nasal spray Place 1 spray into both nostrils as needed.     montelukast  (SINGULAIR ) 10 MG tablet SMARTSIG:1 Tablet(s) By Mouth Every Evening     traZODone  (DESYREL ) 50 MG tablet Take 50 mg by mouth at bedtime.     valACYclovir  (VALTREX ) 500 MG tablet Take one daily 90 tablet 1   latanoprost (XALATAN) 0.005 % ophthalmic solution Place 1 drop into both eyes every morning. (Patient not taking: Reported on 08/04/2023)     No current facility-administered medications for this encounter.    Physical Exam: There were no vitals taken for this visit.  GEN: Well nourished, well developed in no acute distress NECK: No JVD CARDIAC:  Irregularly irregular rate and rhythm, no murmurs, rubs, gallops RESPIRATORY:  Clear to auscultation without rales, wheezing or rhonchi  ABDOMEN: Soft, non-tender, non-distended EXTREMITIES:  No edema; No deformity   Wt Readings from Last 3 Encounters:  05/30/23 83.9 kg  05/19/22 81.6 kg  11/18/21 83.4 kg     EKG today demonstrates  Afib Vent. rate 76 BPM PR interval * ms QRS duration 80 ms QT/QTcB 370/416 ms   Echo 01/27/18 demonstrated  - Procedure narrative:  Transthoracic echocardiography. Image    quality was adequate. The study was technically difficult.  - Left ventricle: The cavity size was normal. Systolic function was    normal. The estimated ejection fraction was in the range of 60%    to 65%. Wall motion was normal; there were no regional wall    motion abnormalities. The study is not technically sufficient to    allow evaluation of LV diastolic function.  - Right atrium: The atrium was mildly dilated.  - Atrial septum: Atrial septal aneurysm with marked bowing of the    mid and superior aspect of the septum to the right. No obvious    PFO    Bubble study not done.    CHA2DS2-VASc Score = 1  The patient's score is based upon: CHF History: 0 HTN History: 0 Diabetes History: 0 Stroke History: 0 Vascular Disease History: 0 Age Score: 0 Gender Score: 1       ASSESSMENT AND PLAN: Persistent Atrial Fibrillation (ICD10:  I48.19) The patient's CHA2DS2-VASc score is 1, indicating a 0.6% annual risk of stroke.   Patient in persistent symptomatic afib. We discussed rhythm control options today. Will start Eliquis  5 mg BID and arrange for DCCV after at least 3 weeks of anticoagulation.  Continue Multaq  400 mg BID for now. Long term, patient is interested in discussing PFA with Dr Inocencio, will refer.  Check bmet/cbc today  High Risk Medication Monitoring (ICD 10: Z79.899) Intervals on ECG acceptable for dronedarone  monitoring.        Follow up with Dr Inocencio to discuss ablation.    Informed Consent   Shared Decision Making/Informed Consent The risks (stroke, cardiac arrhythmias rarely resulting in the need for a temporary or permanent pacemaker, skin irritation or burns and complications associated with conscious sedation including aspiration, arrhythmia, respiratory failure and death), benefits (restoration of normal sinus rhythm) and alternatives of a direct current cardioversion were explained in detail to Debra Hardin and she  agrees to proceed.         [facility] [address] [place], [place] [address] [phone]

## 2024-01-31 ENCOUNTER — Encounter: Payer: Self-pay | Admitting: Cardiology

## 2024-01-31 ENCOUNTER — Ambulatory Visit (HOSPITAL_COMMUNITY): Payer: Self-pay | Admitting: Physician Assistant

## 2024-01-31 LAB — BASIC METABOLIC PANEL WITH GFR
BUN/Creatinine Ratio: 19 (ref 12–28)
BUN: 18 mg/dL (ref 8–27)
CO2: 20 mmol/L (ref 20–29)
Calcium: 9.2 mg/dL (ref 8.7–10.3)
Chloride: 103 mmol/L (ref 96–106)
Creatinine, Ser: 0.93 mg/dL (ref 0.57–1.00)
Glucose: 81 mg/dL (ref 70–99)
Potassium: 4.2 mmol/L (ref 3.5–5.2)
Sodium: 140 mmol/L (ref 134–144)
eGFR: 69 mL/min/1.73 (ref >59)

## 2024-01-31 LAB — CBC
Hematocrit: 40.8 % (ref 34.0–46.6)
Hemoglobin: 13.2 g/dL (ref 11.1–15.9)
MCH: 31.1 pg (ref 26.6–33.0)
MCHC: 32.4 g/dL (ref 31.5–35.7)
MCV: 96 fL (ref 79–97)
Platelets: 205 x10E3/uL (ref 150–450)
RBC: 4.24 x10E6/uL (ref 3.77–5.28)
RDW: 11.9 % (ref 11.7–15.4)
WBC: 5.4 x10E3/uL (ref 3.4–10.8)

## 2024-02-01 NOTE — Addendum Note (Signed)
 Encounter addended by: Janel Nancy SAUNDERS, RN on: 02/01/2024 12:06 PM  Actions taken: Order list changed, Diagnosis association updated

## 2024-02-09 ENCOUNTER — Encounter (INDEPENDENT_AMBULATORY_CARE_PROVIDER_SITE_OTHER): Payer: Self-pay

## 2024-02-09 ENCOUNTER — Other Ambulatory Visit (HOSPITAL_COMMUNITY): Payer: Self-pay | Admitting: *Deleted

## 2024-02-09 DIAGNOSIS — I4819 Other persistent atrial fibrillation: Secondary | ICD-10-CM

## 2024-02-16 DIAGNOSIS — L578 Other skin changes due to chronic exposure to nonionizing radiation: Secondary | ICD-10-CM | POA: Diagnosis not present

## 2024-02-16 DIAGNOSIS — L738 Other specified follicular disorders: Secondary | ICD-10-CM | POA: Diagnosis not present

## 2024-02-16 DIAGNOSIS — D225 Melanocytic nevi of trunk: Secondary | ICD-10-CM | POA: Diagnosis not present

## 2024-02-16 DIAGNOSIS — L720 Epidermal cyst: Secondary | ICD-10-CM | POA: Diagnosis not present

## 2024-02-20 DIAGNOSIS — F411 Generalized anxiety disorder: Secondary | ICD-10-CM | POA: Diagnosis not present

## 2024-02-20 NOTE — Anesthesia Preprocedure Evaluation (Addendum)
 Anesthesia Evaluation  Patient identified by MRN, date of birth, ID band Patient awake    Reviewed: Allergy & Precautions, NPO status , Patient's Chart, lab work & pertinent test results  History of Anesthesia Complications Negative for: history of anesthetic complications  Airway Mallampati: III  TM Distance: >3 FB Neck ROM: Full    Dental  (+) Dental Advisory Given   Pulmonary neg shortness of breath, asthma , sleep apnea , neg COPD, neg recent URI   Pulmonary exam normal breath sounds clear to auscultation       Cardiovascular (-) hypertension(-) angina (-) Past MI, (-) Cardiac Stents and (-) CABG + dysrhythmias Atrial Fibrillation  Rhythm:Irregular Rate:Normal  HLD  TTE 01/27/2018: Study Conclusions   - Procedure narrative: Transthoracic echocardiography. Image    quality was adequate. The study was technically difficult.  - Left ventricle: The cavity size was normal. Systolic function was    normal. The estimated ejection fraction was in the range of 60%    to 65%. Wall motion was normal; there were no regional wall    motion abnormalities. The study is not technically sufficient to    allow evaluation of LV diastolic function.  - Right atrium: The atrium was mildly dilated.  - Atrial septum: Atrial septal aneurysm with marked bowing of the    mid and superior aspect of the septum to the right. No obvious    PFO    Bubble study not done.     Neuro/Psych neg Seizures PSYCHIATRIC DISORDERS Anxiety Depression    Vertigo     GI/Hepatic negative GI ROS, Neg liver ROS,,,  Endo/Other  neg diabetes  Multiple thyroid  nodules  Renal/GU negative Renal ROS     Musculoskeletal  (+) Arthritis ,    Abdominal   Peds  Hematology negative hematology ROS (+)   Anesthesia Other Findings Last Eliquis : this morning  Reproductive/Obstetrics                              Anesthesia  Physical Anesthesia Plan  ASA: 3  Anesthesia Plan: General   Post-op Pain Management: Minimal or no pain anticipated   Induction: Intravenous  PONV Risk Score and Plan: 3 and TIVA and Treatment may vary due to age or medical condition  Airway Management Planned: Natural Airway and Nasal Cannula  Additional Equipment:   Intra-op Plan:   Post-operative Plan:   Informed Consent: I have reviewed the patients History and Physical, chart, labs and discussed the procedure including the risks, benefits and alternatives for the proposed anesthesia with the patient or authorized representative who has indicated his/her understanding and acceptance.       Plan Discussed with: Anesthesiologist and CRNA  Anesthesia Plan Comments: (Risks of general anesthesia discussed including, but not limited to, sore throat, hoarse voice, chipped/damaged teeth, injury to vocal cords, nausea and vomiting, allergic reactions, lung infection, heart attack, stroke, and death. All questions answered. )         Anesthesia Quick Evaluation

## 2024-02-20 NOTE — Progress Notes (Signed)
 Pt called for pre procedure instructions.  Message left on verified voicemail. Arrival time 0700 NPO after midnight explained Instructed to take am meds with sip of water and confirmed blood thinner consistency Instructed pt need for ride home tomorrow and have responsible adult with them for 24 hrs post procedure.

## 2024-02-21 ENCOUNTER — Encounter (HOSPITAL_COMMUNITY): Payer: Self-pay | Admitting: Internal Medicine

## 2024-02-21 ENCOUNTER — Encounter (HOSPITAL_COMMUNITY)
Admission: RE | Disposition: A | Discharge status: Home / Self Care | Payer: Self-pay | Source: Home / Self Care | Attending: Internal Medicine

## 2024-02-21 ENCOUNTER — Ambulatory Visit (HOSPITAL_COMMUNITY): Payer: Self-pay | Admitting: Anesthesiology

## 2024-02-21 ENCOUNTER — Other Ambulatory Visit: Payer: Self-pay

## 2024-02-21 ENCOUNTER — Ambulatory Visit (HOSPITAL_COMMUNITY)
Admission: RE | Admit: 2024-02-21 | Discharge: 2024-02-21 | Disposition: A | Discharge status: Home / Self Care | Payer: Self-pay | Attending: Internal Medicine | Admitting: Internal Medicine

## 2024-02-21 DIAGNOSIS — G4733 Obstructive sleep apnea (adult) (pediatric): Secondary | ICD-10-CM

## 2024-02-21 DIAGNOSIS — Z7901 Long term (current) use of anticoagulants: Secondary | ICD-10-CM | POA: Insufficient documentation

## 2024-02-21 DIAGNOSIS — F418 Other specified anxiety disorders: Secondary | ICD-10-CM

## 2024-02-21 DIAGNOSIS — J45909 Unspecified asthma, uncomplicated: Secondary | ICD-10-CM

## 2024-02-21 DIAGNOSIS — Z79899 Other long term (current) drug therapy: Secondary | ICD-10-CM | POA: Diagnosis not present

## 2024-02-21 DIAGNOSIS — E785 Hyperlipidemia, unspecified: Secondary | ICD-10-CM | POA: Diagnosis not present

## 2024-02-21 DIAGNOSIS — I4819 Other persistent atrial fibrillation: Secondary | ICD-10-CM

## 2024-02-21 DIAGNOSIS — I4891 Unspecified atrial fibrillation: Secondary | ICD-10-CM | POA: Diagnosis not present

## 2024-02-21 HISTORY — PX: CARDIOVERSION: EP1203

## 2024-02-21 SURGERY — CARDIOVERSION (CATH LAB)
Anesthesia: General

## 2024-02-21 MED ORDER — SODIUM CHLORIDE 0.9 % IV SOLN: INTRAVENOUS | Status: DC

## 2024-02-21 MED ORDER — PROPOFOL 10 MG/ML IV BOLUS
INTRAVENOUS | Status: DC | PRN
  Administered 2024-02-21: 70 mg via INTRAVENOUS

## 2024-02-21 MED ORDER — SODIUM CHLORIDE 0.9% FLUSH
INTRAVENOUS | Status: DC | PRN
Start: 2024-02-21 — End: 2024-02-21
  Administered 2024-02-21: 5 mL via INTRAVENOUS

## 2024-02-21 SURGICAL SUPPLY — 1 items: PAD DEFIB RADIO PHYSIO CONN (PAD) ×1 IMPLANT

## 2024-02-21 NOTE — Transfer of Care (Signed)
 Immediate Anesthesia Transfer of Care Note  Patient: Debra Hardin  Procedure(s) Performed: CARDIOVERSION  Patient Location: PACU and Cath Lab  Anesthesia Type:MAC  Level of Consciousness: drowsy  Airway & Oxygen Therapy: Patient Spontanous Breathing and Patient connected to nasal cannula oxygen  Post-op Assessment: Report given to RN and Post -op Vital signs reviewed and stable  Post vital signs: Reviewed and stable  Last Vitals:  Vitals Value Taken Time  BP    Temp    Pulse 79 02/21/24 08:26  Resp 15 02/21/24 08:26  SpO2 100 % 02/21/24 08:26  Vitals shown include unfiled device data.  Last Pain:  Vitals:   02/21/24 0724  TempSrc: Temporal         Complications: No notable events documented.

## 2024-02-21 NOTE — Discharge Instructions (Signed)

## 2024-02-21 NOTE — CV Procedure (Signed)
    CARDIOVERSION NOTE  Procedure: Electrical Cardioversion Indications:  Atrial Fibrillation  Procedure Details:  Consent: Risks of procedure as well as the alternatives and risks of each were explained to the (patient/caregiver).  Consent for procedure obtained.  Time Out: Verified patient identification, verified procedure, site/side was marked, verified correct patient position, special equipment/implants available, medications/allergies/relevent history reviewed, required imaging and test results available.  Performed  Patient placed on cardiac monitor, pulse oximetry, supplemental oxygen as necessary.  Sedation given: propofol  per anesthesia Pacer pads placed anterior and posterior chest.  Cardioverted 1 time(s).  Cardioverted at 300J biphasic.  Impression: Findings: Post procedure EKG shows: NSR Complications: None Patient did tolerate procedure well.  Plan: Successful DCCV with a single 300J biphasic shock to NSR.  Time Spent Directly with the Patient:  30 minutes   Vinie KYM Maxcy, MD, [facility], FNLA, FACP  [place]  [facility]  Medical Director of the [facility] &  Cardiovascular Risk Reduction Clinic Diplomate of the American Board of Clinical Lipidology Attending Cardiologist  Direct Dial: [phone]  Fax: [phone]  Website:  [email or web address] [email or web address] Maxcy 02/21/2024, 8:36 AM

## 2024-02-21 NOTE — Anesthesia Postprocedure Evaluation (Signed)
 Anesthesia Post Note  Patient: Debra Hardin  Procedure(s) Performed: CARDIOVERSION     Patient location during evaluation: PACU Anesthesia Type: General Level of consciousness: awake Pain management: pain level controlled Vital Signs Assessment: post-procedure vital signs reviewed and stable Respiratory status: spontaneous breathing, nonlabored ventilation and respiratory function stable Cardiovascular status: blood pressure returned to baseline and stable Postop Assessment: no apparent nausea or vomiting Anesthetic complications: no   No notable events documented.  Last Vitals:  Vitals:   02/21/24 0848 02/21/24 0858  BP: 106/65 109/70  Pulse: 61 62  Resp: 20 20  Temp:    SpO2: 100% 97%    Last Pain:  Vitals:   02/21/24 0858  TempSrc:   PainSc: 0-No pain                 Delon Aisha Arch

## 2024-02-21 NOTE — Interval H&P Note (Signed)
 History and Physical Interval Note:  02/21/2024 7:46 AM  Debra Hardin  has presented today for surgery, with the diagnosis of Afib.  The various methods of treatment have been discussed with the patient and family. After consideration of risks, benefits and other options for treatment, the patient has consented to  Procedure(s): CARDIOVERSION (N/A) as a surgical intervention.  The patient's history has been reviewed, patient examined, no change in status, stable for surgery.  I have reviewed the patient's chart and labs.  Questions were answered to the patient's satisfaction.     Vinie JAYSON Maxcy

## 2024-02-27 ENCOUNTER — Encounter: Payer: Self-pay | Admitting: Cardiology

## 2024-03-05 NOTE — Progress Notes (Unsigned)
  Electrophysiology Office Note:   Date:  03/05/2024  ID:  Debra Hardin, DOB [birth year], MRN [id number]  Primary Cardiologist: None Primary Heart Failure: None Electrophysiologist: Laterra Lubinski Gladis Norton, MD  {Click to update primary MD,subspecialty MD or APP then REFRESH:1}    History of Present Illness:   Debra Hardin is a 63 y.o. female with h/o thyroid  nodules, sleep apnea, hyperlipidemia, atrial fibrillation seen today for routine electrophysiology followup.   She has previously been maintained on Multaq  for rhythm control.  She presented to A-fib clinic 01/30/2024 in atrial fibrillation.  She had symptoms of palpitations and fatigue.  She has post cardioversion 02/21/2024.  Since last being seen in our clinic the patient reports doing ***.  she denies chest pain, palpitations, dyspnea, PND, orthopnea, nausea, vomiting, dizziness, syncope, edema, weight gain, or early satiety.   Review of systems complete and found to be negative unless listed in HPI.   EP Information / Studies Reviewed:    {EKGtoday:28818}     Risk Assessment/Calculations:    CHA2DS2-VASc Score = 1  {Confirm score is correct.  If not, click here to update score.  REFRESH note.  :1} This indicates a 0.6% annual risk of stroke. The patient's score is based upon: CHF History: 0 HTN History: 0 Diabetes History: 0 Stroke History: 0 Vascular Disease History: 0 Age Score: 0 Gender Score: 1             Physical Exam:   VS:  There were no vitals taken for this visit.   Wt Readings from Last 3 Encounters:  02/21/24 181 lb 3.2 oz (82.2 kg)  01/30/24 187 lb 3.2 oz (84.9 kg)  05/30/23 185 lb (83.9 kg)     GEN: Well nourished, well developed in no acute distress NECK: No JVD; No carotid bruits CARDIAC: {EPRHYTHM:28826}, no murmurs, rubs, gallops RESPIRATORY:  Clear to auscultation without rales, wheezing or rhonchi  ABDOMEN: Soft, non-tender, non-distended EXTREMITIES:  No edema; No deformity   ASSESSMENT AND  PLAN:    1.  Persistent atrial fibrillation: On Multaq .  Post cardioversion.***  2.  High-risk medication monitoring: On Multaq .  3.  Obstructive sleep apnea: CPAP compliance encouraged  Follow up with {EPMDS:28135::EP Team} {EPFOLLOW LE:71826}  Signed, Shellye Zandi Gladis Norton, MD

## 2024-03-06 ENCOUNTER — Encounter: Payer: Self-pay | Admitting: Cardiology

## 2024-03-06 ENCOUNTER — Ambulatory Visit: Attending: Cardiology | Admitting: Cardiology

## 2024-03-06 VITALS — BP 98/56 | HR 82 | Ht 66.0 in | Wt 186.0 lb

## 2024-03-06 DIAGNOSIS — I4819 Other persistent atrial fibrillation: Secondary | ICD-10-CM | POA: Diagnosis not present

## 2024-03-06 DIAGNOSIS — Z01812 Encounter for preprocedural laboratory examination: Secondary | ICD-10-CM

## 2024-03-06 DIAGNOSIS — Z5181 Encounter for therapeutic drug level monitoring: Secondary | ICD-10-CM | POA: Diagnosis not present

## 2024-03-06 DIAGNOSIS — G4733 Obstructive sleep apnea (adult) (pediatric): Secondary | ICD-10-CM | POA: Diagnosis not present

## 2024-03-06 DIAGNOSIS — Z79899 Other long term (current) drug therapy: Secondary | ICD-10-CM

## 2024-03-06 MED ORDER — AMIODARONE HCL 200 MG PO TABS: ORAL_TABLET | ORAL | 3 refills | Status: DC

## 2024-03-06 NOTE — Patient Instructions (Addendum)
 Medication Instructions:  Your physician has recommended you make the following change in your medication:  START Amiodarone   - take 2 tablets (400 mg total) TWICE a day for 2 weeks, then  - take 1 tablet (200 mg total) TWICE a day for 2 weeks, then  - take 1 tablet (200 mg total) ONCE a day   *If you need a refill on your cardiac medications before your next appointment, please call your pharmacy*   Lab Work: Pre cardioversion labs today:  BMP & CBC  Pre ablation labs -- we will call you to schedule:  BMP & CBC  If you have a lab test that is abnormal and we need to change your treatment, we will call you to review the results -- otherwise no news is good news.    Testing/Procedures: Your physician has recommended that you have a Cardioversion (DCCV). Electrical Cardioversion uses a jolt of electricity to your heart either through paddles or wired patches attached to your chest. This is a controlled, usually prescheduled, procedure. Defibrillation is done under light anesthesia in the hospital, and you usually go home the day of the procedure. This is done to get your heart back into a normal rhythm. You are not awake for the procedure. Please see the instruction sheet below located under other instructions.    Your physician has recommended that you have an ablation. Catheter ablation is a medical procedure used to treat some cardiac arrhythmias (irregular heartbeats). During catheter ablation, a long, thin, flexible tube is put into a blood vessel in your groin (upper thigh), or neck. This tube is called an ablation catheter. It is then guided to your heart through the blood vessel. Radio frequency waves destroy small areas of heart tissue where abnormal heartbeats may cause an arrhythmia to start.   Your ablation is scheduled for 05/08/2024. Please arrive at [facility] at 5:30 am.  We will call/send instructions at a later date.   Follow-Up: At [facility], you and your  health needs are our priority.  As part of our continuing mission to provide you with exceptional heart care, we have created designated Provider Care Teams.  These Care Teams include your primary Cardiologist (physician) and Advanced Practice Providers (APPs -  Physician Assistants and Nurse Practitioners) who all work together to provide you with the care you need, when you need it.  Your next appointment:   1 month(s) after your ablation  The format for your next appointment:   In Person  Provider:   AFib clinic   Thank you for choosing [facility]!!   Maeola Domino, RN [phone]    Other Instructions      Dear Debra Hardin  You are scheduled for a Cardioversion on Thursday, September 25 with Dr. Pietro.  Please arrive at the [facility] (Main Entrance A) at [facility]: [address] [place], [place] [address] at 8:00 AM (This time is 1 hour(s) before your procedure to ensure your preparation).   Free valet parking service is available. You will check in at ADMITTING.   *Please Note: You will receive a call the day before your procedure to confirm the appointment time. That time may have changed from the original time based on the schedule for that day.*    DIET:  Nothing to eat or drink after midnight except a sip of water with medications (see medication instructions below)  MEDICATION INSTRUCTIONS: !!IF ANY NEW MEDICATIONS ARE STARTED AFTER TODAY, PLEASE NOTIFY  YOUR PROVIDER AS SOON AS POSSIBLE!!  FYI: Medications such as Semaglutide (Ozempic, [place]), Tirzepatide (Mounjaro, Zepbound), Dulaglutide (Trulicity), etc (GLP1 agonists) AND Canagliflozin (Invokana), Dapagliflozin (Farxiga), Empagliflozin (Jardiance), Ertugliflozin (Steglatro), Bexagliflozin [employer]) or any combination with one of these drugs such as Invokamet (Canagliflozin/Metformin), Synjardy (Empagliflozin/Metformin), etc (SGLT2 inhibitors) must be held around the time of a procedure.  This is not a comprehensive list of all of these drugs. Please review all of your medications and talk to your provider if you take any one of these. If you are not sure, ask your provider.    Continue taking your anticoagulant (blood thinner): Apixaban  (Eliquis ).  You will need to continue this after your procedure until you are told by your provider that it is safe to stop.    LABS:  today, 9/2   FYI:  For your safety, and to allow us  to monitor your vital signs accurately during the surgery/procedure we request: If you have artificial nails, gel coating, SNS etc, please have those removed prior to your surgery/procedure. Not having the nail coverings /polish removed may result in cancellation or delay of your surgery/procedure.  Your support person will be asked to wait in the waiting room during your procedure.  It is OK to have someone drop you off and come back when you are ready to be discharged.  You cannot drive after the procedure and will need someone to drive you home.  Bring your insurance cards.  *Special Note: Every effort is made to have your procedure done on time. Occasionally there are emergencies that occur at the hospital that may cause delays. Please be patient if a delay does occur.       Cardiac Ablation Cardiac ablation is a procedure to destroy (ablate) some heart tissue that is sending bad signals. These bad signals cause problems in heart rhythm. The heart has many areas that make these signals. If there are problems in these areas, they can make the heart beat in a way that is not normal. Destroying some tissues can help make the heart rhythm normal. Tell your doctor about: Any allergies you have. All medicines you are taking. These include vitamins, herbs, eye drops, creams, and over-the-counter medicines. Any problems you or family members have had with medicines that make you fall asleep (anesthetics). Any blood disorders you have. Any surgeries you have  had. Any medical conditions you have, such as kidney failure. Whether you are pregnant or may be pregnant. What are the risks? This is a safe procedure. But problems may occur, including: Infection. Bruising and bleeding. Bleeding into the chest. Stroke or blood clots. Damage to nearby areas of your body. Allergies to medicines or dyes. The need for a pacemaker if the normal system is damaged. Failure of the procedure to treat the problem. What happens before the procedure? Medicines Ask your doctor about: Changing or stopping your normal medicines. This is important. Taking aspirin and ibuprofen. Do not take these medicines unless your doctor tells you to take them. Taking other medicines, vitamins, herbs, and supplements. General instructions Follow instructions from your doctor about what you cannot eat or drink. Plan to have someone take you home from the hospital or clinic. If you will be going home right after the procedure, plan to have someone with you for 24 hours. Ask your doctor what steps will be taken to prevent infection. What happens during the procedure?  An IV tube will be put into one of your veins. You will be given a  medicine to help you relax. The skin on your neck or groin will be numbed. A cut (incision) will be made in your neck or groin. A needle will be put through your cut and into a large vein. A tube (catheter) will be put into the needle. The tube will be moved to your heart. Dye may be put through the tube. This helps your doctor see your heart. Small devices (electrodes) on the tube will send out signals. A type of energy will be used to destroy some heart tissue. The tube will be taken out. Pressure will be held on your cut. This helps stop bleeding. A bandage will be put over your cut. The exact procedure may vary among doctors and hospitals. What happens after the procedure? You will be watched until you leave the hospital or clinic. This  includes checking your heart rate, breathing rate, oxygen, and blood pressure. Your cut will be watched for bleeding. You will need to lie still for a few hours. Do not drive for 24 hours or as long as your doctor tells you. Summary Cardiac ablation is a procedure to destroy some heart tissue. This is done to treat heart rhythm problems. Tell your doctor about any medical conditions you may have. Tell him or her about all medicines you are taking to treat them. This is a safe procedure. But problems may occur. These include infection, bruising, bleeding, and damage to nearby areas of your body. Follow what your doctor tells you about food and drink. You may also be told to change or stop some of your medicines. After the procedure, do not drive for 24 hours or as long as your doctor tells you. This information is not intended to replace advice given to you by your health care provider. Make sure you discuss any questions you have with your health care provider. Document Revised: 09/11/2021 Document Reviewed: 05/24/2019 Elsevier Patient Education  2023 Elsevier Inc.   Cardiac Ablation, Care After  This sheet gives you information about how to care for yourself after your procedure. Your health care provider may also give you more specific instructions. If you have problems or questions, contact your health care provider. What can I expect after the procedure? After the procedure, it is common to have: Bruising around your puncture site. Tenderness around your puncture site. Skipped heartbeats. If you had an atrial fibrillation ablation, you may have atrial fibrillation during the first several months after your procedure.  Tiredness (fatigue).  Follow these instructions at home: Puncture site care  Follow instructions from your health care provider about how to take care of your puncture site. Make sure you: If present, leave stitches (sutures), skin glue, or adhesive strips in place.  These skin closures may need to stay in place for up to 2 weeks. If adhesive strip edges start to loosen and curl up, you may trim the loose edges. Do not remove adhesive strips completely unless your health care provider tells you to do that. If a large square bandage is present, this may be removed 24 hours after surgery.  Check your puncture site every day for signs of infection. Check for: Redness, swelling, or pain. Fluid or blood. If your puncture site starts to bleed, lie down on your back, apply firm pressure to the area, and contact your health care provider. Warmth. Pus or a bad smell. A pea or small marble sized lump at the site is normal and can take up to three months to resolve.  Driving Do not drive for at least 4 days after your procedure or however long your health care provider recommends. (Do not resume driving if you have previously been instructed not to drive for other health reasons.) Do not drive or use heavy machinery while taking prescription pain medicine. Activity Avoid activities that take a lot of effort for at least 7 days after your procedure. Do not lift anything that is heavier than 5 lb (4.5 kg) for one week.  No sexual activity for 1 week.  Return to your normal activities as told by your health care provider. Ask your health care provider what activities are safe for you. General instructions Take over-the-counter and prescription medicines only as told by your health care provider. Do not use any products that contain nicotine or tobacco, such as cigarettes and e-cigarettes. If you need help quitting, ask your health care provider. You may shower after 24 hours, but Do not take baths, swim, or use a hot tub for 1 week.  Do not drink alcohol for 24 hours after your procedure. Keep all follow-up visits as told by your health care provider. This is important. Contact a health care provider if: You have redness, mild swelling, or pain around your puncture  site. You have fluid or blood coming from your puncture site that stops after applying firm pressure to the area. Your puncture site feels warm to the touch. You have pus or a bad smell coming from your puncture site. You have a fever. You have chest pain or discomfort that spreads to your neck, jaw, or arm. You have chest pain that is worse with lying on your back or taking a deep breath. You are sweating a lot. You feel nauseous. You have a fast or irregular heartbeat. You have shortness of breath. You are dizzy or light-headed and feel the need to lie down. You have pain or numbness in the arm or leg closest to your puncture site. Get help right away if: Your puncture site suddenly swells. Your puncture site is bleeding and the bleeding does not stop after applying firm pressure to the area. These symptoms may represent a serious problem that is an emergency. Do not wait to see if the symptoms will go away. Get medical help right away. Call your local emergency services (911 in the [place] Do not drive yourself to the hospital. Summary After the procedure, it is normal to have bruising and tenderness at the puncture site in your groin, neck, or forearm. Check your puncture site every day for signs of infection. Get help right away if your puncture site is bleeding and the bleeding does not stop after applying firm pressure to the area. This is a medical emergency. This information is not intended to replace advice given to you by your health care provider. Make sure you discuss any questions you have with your health care provider.

## 2024-03-07 LAB — CBC
Hematocrit: 42.3 % (ref 34.0–46.6)
Hemoglobin: 13.7 g/dL (ref 11.1–15.9)
MCH: 30.4 pg (ref 26.6–33.0)
MCHC: 32.4 g/dL (ref 31.5–35.7)
MCV: 94 fL (ref 79–97)
Platelets: 216 x10E3/uL (ref 150–450)
RBC: 4.5 x10E6/uL (ref 3.77–5.28)
RDW: 11.8 % (ref 11.7–15.4)
WBC: 4.3 x10E3/uL (ref 3.4–10.8)

## 2024-03-07 LAB — BASIC METABOLIC PANEL WITH GFR
BUN/Creatinine Ratio: 14 (ref 12–28)
BUN: 13 mg/dL (ref 8–27)
CO2: 25 mmol/L (ref 20–29)
Calcium: 9.3 mg/dL (ref 8.7–10.3)
Chloride: 102 mmol/L (ref 96–106)
Creatinine, Ser: 0.92 mg/dL (ref 0.57–1.00)
Glucose: 84 mg/dL (ref 70–99)
Potassium: 4.7 mmol/L (ref 3.5–5.2)
Sodium: 138 mmol/L (ref 134–144)
eGFR: 70 mL/min/1.73

## 2024-03-12 ENCOUNTER — Encounter: Payer: Self-pay | Admitting: Cardiology

## 2024-03-14 DIAGNOSIS — Z23 Encounter for immunization: Secondary | ICD-10-CM | POA: Diagnosis not present

## 2024-03-19 DIAGNOSIS — F411 Generalized anxiety disorder: Secondary | ICD-10-CM | POA: Diagnosis not present

## 2024-03-19 DIAGNOSIS — H401132 Primary open-angle glaucoma, bilateral, moderate stage: Secondary | ICD-10-CM | POA: Diagnosis not present

## 2024-03-19 DIAGNOSIS — Z961 Presence of intraocular lens: Secondary | ICD-10-CM | POA: Diagnosis not present

## 2024-03-26 ENCOUNTER — Telehealth: Payer: Self-pay | Admitting: Cardiology

## 2024-03-26 DIAGNOSIS — Z23 Encounter for immunization: Secondary | ICD-10-CM | POA: Diagnosis not present

## 2024-03-26 DIAGNOSIS — E785 Hyperlipidemia, unspecified: Secondary | ICD-10-CM | POA: Diagnosis not present

## 2024-03-26 DIAGNOSIS — T887XXS Unspecified adverse effect of drug or medicament, sequela: Secondary | ICD-10-CM | POA: Diagnosis not present

## 2024-03-26 DIAGNOSIS — G4733 Obstructive sleep apnea (adult) (pediatric): Secondary | ICD-10-CM | POA: Diagnosis not present

## 2024-03-26 DIAGNOSIS — I4819 Other persistent atrial fibrillation: Secondary | ICD-10-CM | POA: Diagnosis not present

## 2024-03-26 DIAGNOSIS — J4520 Mild intermittent asthma, uncomplicated: Secondary | ICD-10-CM | POA: Diagnosis not present

## 2024-03-26 NOTE — Telephone Encounter (Signed)
 Spoke with pt who reports she wants to cancel her cardioversion scheduled for Thursday 9/25.  She reports her Crist app demonstrated she is in NSR and has been for 2 weeks.  Advised I will forward this information to Dr Pietro (as pt requests) though I do not see where pt has been seen by him in the past.  Will also forward to Dr Inocencio and his nurse for their knowledge.

## 2024-03-26 NOTE — Telephone Encounter (Signed)
 Pt would like to cancel cathlab procedure as she is back in sinus rhythm.

## 2024-03-29 ENCOUNTER — Ambulatory Visit (HOSPITAL_COMMUNITY): Admission: RE | Admit: 2024-03-29 | Payer: Self-pay | Source: Home / Self Care | Admitting: Cardiology

## 2024-03-29 ENCOUNTER — Encounter (HOSPITAL_COMMUNITY): Admission: RE | Payer: Self-pay | Source: Home / Self Care

## 2024-03-29 DIAGNOSIS — I480 Paroxysmal atrial fibrillation: Secondary | ICD-10-CM

## 2024-03-29 SURGERY — CARDIOVERSION (CATH LAB)
Anesthesia: General

## 2024-04-02 DIAGNOSIS — F411 Generalized anxiety disorder: Secondary | ICD-10-CM | POA: Diagnosis not present

## 2024-04-06 ENCOUNTER — Telehealth: Payer: Self-pay

## 2024-04-06 NOTE — Telephone Encounter (Signed)
-----   Message from Nurse Sherri P sent at 03/06/2024 12:06 PM EDT ----- Regarding: 05/08/2024 afib ablation Precert:  MD: Camnitz Type of ablation: A-fib Diagnosis: afib CPT code: A-fib (06343) Ablation scheduled (date/time): 05/08/2024 @ 7:30 am  Procedure:  Added to calendar? Yes Orders entered? Yes Letter complete? No, >30 days before procedure Scheduled with cath lab? Yes Any medications to hold? No Labs ordered (CBC, BMET, PT/INR if on warfarin): Yes Mapping system: Doesn't matter CARTO/OPAL rep notified? No Cardiac CT needed? No Dye allergy? No Pre-meds ordered and instructions given? No, n/a Letter method: MyChart H&P:  Device: No  Follow-up:  Cassie/Angel, please schedule Routine.

## 2024-04-06 NOTE — Telephone Encounter (Signed)
Work up complete. 

## 2024-04-11 DIAGNOSIS — Z01812 Encounter for preprocedural laboratory examination: Secondary | ICD-10-CM | POA: Diagnosis not present

## 2024-04-11 DIAGNOSIS — I4819 Other persistent atrial fibrillation: Secondary | ICD-10-CM | POA: Diagnosis not present

## 2024-04-11 LAB — CBC

## 2024-04-12 LAB — BASIC METABOLIC PANEL WITH GFR
BUN/Creatinine Ratio: 15 (ref 12–28)
BUN: 15 mg/dL (ref 8–27)
CO2: 22 mmol/L (ref 20–29)
Calcium: 9.4 mg/dL (ref 8.7–10.3)
Chloride: 102 mmol/L (ref 96–106)
Creatinine, Ser: 0.97 mg/dL (ref 0.57–1.00)
Glucose: 79 mg/dL (ref 70–99)
Potassium: 4.6 mmol/L (ref 3.5–5.2)
Sodium: 140 mmol/L (ref 134–144)
eGFR: 65 mL/min/1.73 (ref >59)

## 2024-04-12 LAB — CBC
Hematocrit: 42 % (ref 34.0–46.6)
Hemoglobin: 13.3 g/dL (ref 11.1–15.9)
MCH: 30.4 pg (ref 26.6–33.0)
MCHC: 31.7 g/dL (ref 31.5–35.7)
MCV: 96 fL (ref 79–97)
Platelets: 196 x10E3/uL (ref 150–450)
RBC: 4.38 x10E6/uL (ref 3.77–5.28)
RDW: 12.3 % (ref 11.7–15.4)
WBC: 3.8 x10E3/uL (ref 3.4–10.8)

## 2024-04-17 ENCOUNTER — Telehealth (HOSPITAL_COMMUNITY): Payer: Self-pay

## 2024-04-17 ENCOUNTER — Encounter (HOSPITAL_COMMUNITY): Payer: Self-pay

## 2024-04-17 DIAGNOSIS — F411 Generalized anxiety disorder: Secondary | ICD-10-CM | POA: Diagnosis not present

## 2024-04-17 NOTE — Telephone Encounter (Signed)
 Attempted to reach patient to discuss upcoming procedure, no answer. Left VM for patient to return call.

## 2024-04-19 NOTE — Telephone Encounter (Signed)
 Patient returned call to complete pre-procedure call.     Health status review:  Any new medical conditions, recent signs of acute illness or been started on antibiotics? No Any recent hospitalizations or surgeries? No Any new medications started since pre-op visit? No  Follow all medication instructions prior to procedure or the procedure may be rescheduled:    Continue taking Eliquis  (Apixaban ) twice daily without missing any doses before procedure. Essential chronic medications:  No medication should be continued, unless told otherwise. On the morning of your procedure DO NOT take any medication., including Eliquis  (Apixaban ).  Nothing to eat or drink after midnight prior to your procedure.  Pre-procedure testing scheduled: lab work completed, no CT required.  Confirmed patient is scheduled for Atrial Fibrillation Ablation on Tuesday, November 4 with Dr. Dr. Inocencio. Instructed patient to arrive at the Main Entrance A at [facility]: [address] [place], [place] [address] and check in at Admitting at 5:30 AM.  Advised of plan to go home the same day and will only stay overnight if medically necessary. You MUST have a responsible adult to drive you home and MUST be with you the first 24 hours after you arrive home or your procedure could be cancelled.  Informed patient a nurse will call a day before the procedure to confirm arrival time and ensure instructions are followed.  Patient verbalized understanding to information provided, all questions answered and is agreeable to proceed with procedure.   Advised patient to contact RN Navigator at [phone], to inform of any new medications started after call or concerns prior to procedure.

## 2024-04-30 ENCOUNTER — Other Ambulatory Visit (HOSPITAL_COMMUNITY): Payer: Self-pay | Admitting: *Deleted

## 2024-04-30 MED ORDER — AMIODARONE HCL 200 MG PO TABS: 200.0000 mg | ORAL_TABLET | Freq: Every day | ORAL | 1 refills | Status: DC

## 2024-04-30 MED ORDER — APIXABAN 5 MG PO TABS: 5.0000 mg | ORAL_TABLET | Freq: Two times a day (BID) | ORAL | 2 refills | Status: AC

## 2024-05-01 DIAGNOSIS — F411 Generalized anxiety disorder: Secondary | ICD-10-CM | POA: Diagnosis not present

## 2024-05-07 NOTE — Pre-Procedure Instructions (Signed)
 Attempted to call patient regarding procedure instructions.  Left voicemail on the following items: Arrival time 0515 Nothing to eat or drink after midnight No meds AM of procedure Responsible person to drive you home and stay with you for 24 hrs  Have you missed any doses of anti-coagulant Eliquis - should be taken twice a day, if you have missed any please let us  know.  Don't take dose morning of procedure.

## 2024-05-08 ENCOUNTER — Ambulatory Visit (HOSPITAL_COMMUNITY)
Admission: RE | Disposition: A | Discharge status: Home / Self Care | Payer: Self-pay | Source: Home / Self Care | Attending: Cardiology

## 2024-05-08 ENCOUNTER — Ambulatory Visit (HOSPITAL_COMMUNITY): Admitting: Anesthesiology

## 2024-05-08 ENCOUNTER — Other Ambulatory Visit: Payer: Self-pay

## 2024-05-08 ENCOUNTER — Ambulatory Visit (HOSPITAL_COMMUNITY)
Admission: RE | Admit: 2024-05-08 | Discharge: 2024-05-08 | Disposition: A | Discharge status: Home / Self Care | Payer: Self-pay | Attending: Cardiology | Admitting: Cardiology

## 2024-05-08 DIAGNOSIS — J45909 Unspecified asthma, uncomplicated: Secondary | ICD-10-CM | POA: Insufficient documentation

## 2024-05-08 DIAGNOSIS — I4891 Unspecified atrial fibrillation: Secondary | ICD-10-CM | POA: Diagnosis not present

## 2024-05-08 DIAGNOSIS — I480 Paroxysmal atrial fibrillation: Secondary | ICD-10-CM | POA: Diagnosis not present

## 2024-05-08 DIAGNOSIS — Z7901 Long term (current) use of anticoagulants: Secondary | ICD-10-CM | POA: Insufficient documentation

## 2024-05-08 DIAGNOSIS — G4730 Sleep apnea, unspecified: Secondary | ICD-10-CM | POA: Insufficient documentation

## 2024-05-08 HISTORY — PX: ATRIAL FIBRILLATION ABLATION: EP1191

## 2024-05-08 LAB — POCT ACTIVATED CLOTTING TIME: Activated Clotting Time: 343 s

## 2024-05-08 SURGERY — ATRIAL FIBRILLATION ABLATION
Anesthesia: General

## 2024-05-08 MED ORDER — ATROPINE SULFATE 1 MG/ML IV SOLN
INTRAVENOUS | Status: DC | PRN
  Administered 2024-05-08: 1 mg via INTRAVENOUS

## 2024-05-08 MED ORDER — HEPARIN SODIUM (PORCINE) 1000 UNIT/ML IJ SOLN
INTRAMUSCULAR | Status: AC
  Filled 2024-05-08: qty 1

## 2024-05-08 MED ORDER — HEPARIN SODIUM (PORCINE) 1000 UNIT/ML IJ SOLN
INTRAMUSCULAR | Status: DC | PRN
  Administered 2024-05-08: 14000 [IU] via INTRAVENOUS

## 2024-05-08 MED ORDER — FENTANYL CITRATE (PF) 250 MCG/5ML IJ SOLN
INTRAMUSCULAR | Status: DC | PRN
  Administered 2024-05-08 (×2): 50 ug via INTRAVENOUS

## 2024-05-08 MED ORDER — ACETAMINOPHEN 325 MG PO TABS: 650.0000 mg | ORAL_TABLET | ORAL | Status: DC | PRN

## 2024-05-08 MED ORDER — ONDANSETRON HCL 4 MG/2ML IJ SOLN
INTRAMUSCULAR | Status: DC | PRN
  Administered 2024-05-08: 4 mg via INTRAVENOUS

## 2024-05-08 MED ORDER — PROPOFOL 10 MG/ML IV BOLUS
INTRAVENOUS | Status: DC | PRN
  Administered 2024-05-08: 150 ug/kg/min via INTRAVENOUS
  Administered 2024-05-08: 100 mg via INTRAVENOUS

## 2024-05-08 MED ORDER — SODIUM CHLORIDE 0.9 % IV SOLN: INTRAVENOUS | Status: DC

## 2024-05-08 MED ORDER — ROCURONIUM BROMIDE 10 MG/ML (PF) SYRINGE
PREFILLED_SYRINGE | INTRAVENOUS | Status: DC | PRN
  Administered 2024-05-08: 30 mg via INTRAVENOUS
  Administered 2024-05-08: 20 mg via INTRAVENOUS
  Administered 2024-05-08: 50 mg via INTRAVENOUS

## 2024-05-08 MED ORDER — MIDAZOLAM HCL (PF) 2 MG/2ML IJ SOLN
INTRAMUSCULAR | Status: DC | PRN
  Administered 2024-05-08: 2 mg via INTRAVENOUS

## 2024-05-08 MED ORDER — PHENYLEPHRINE HCL-NACL 20-0.9 MG/250ML-% IV SOLN
INTRAVENOUS | Status: DC | PRN
  Administered 2024-05-08: 20 ug/min via INTRAVENOUS

## 2024-05-08 MED ORDER — PROTAMINE SULFATE 10 MG/ML IV SOLN
INTRAVENOUS | Status: DC | PRN
  Administered 2024-05-08: 40 mg via INTRAVENOUS

## 2024-05-08 MED ORDER — SUGAMMADEX SODIUM 200 MG/2ML IV SOLN
INTRAVENOUS | Status: DC | PRN
  Administered 2024-05-08: 165.2 mg via INTRAVENOUS

## 2024-05-08 MED ORDER — DEXAMETHASONE SOD PHOSPHATE PF 10 MG/ML IJ SOLN
INTRAMUSCULAR | Status: DC | PRN
  Administered 2024-05-08: 10 mg via INTRAVENOUS

## 2024-05-08 MED ORDER — ONDANSETRON HCL 4 MG/2ML IJ SOLN: 4.0000 mg | Freq: Four times a day (QID) | INTRAMUSCULAR | Status: DC | PRN

## 2024-05-08 MED ORDER — ATROPINE SULFATE 1 MG/10ML IJ SOSY
PREFILLED_SYRINGE | INTRAMUSCULAR | Status: AC
  Filled 2024-05-08: qty 10

## 2024-05-08 MED ORDER — HEPARIN (PORCINE) IN NACL 1000-0.9 UT/500ML-% IV SOLN
INTRAVENOUS | Status: DC | PRN
  Administered 2024-05-08 (×2): 500 mL

## 2024-05-08 MED ORDER — LIDOCAINE 2% (20 MG/ML) 5 ML SYRINGE
INTRAMUSCULAR | Status: DC | PRN
  Administered 2024-05-08: 100 mg via INTRAVENOUS

## 2024-05-08 MED ORDER — FENTANYL CITRATE (PF) 100 MCG/2ML IJ SOLN
INTRAMUSCULAR | Status: AC
  Filled 2024-05-08: qty 2

## 2024-05-08 MED ORDER — MIDAZOLAM HCL 2 MG/2ML IJ SOLN
INTRAMUSCULAR | Status: AC
  Filled 2024-05-08: qty 2

## 2024-05-08 MED ORDER — SODIUM CHLORIDE 0.9 % IV SOLN: 250.0000 mL | INTRAVENOUS | Status: DC | PRN

## 2024-05-08 SURGICAL SUPPLY — 16 items
CABLE FARASTAR GEN2 SNGL USE (CABLE) IMPLANT
CATH ACUNAV GE 8F-90 (CATHETERS) IMPLANT
CATH FARAWAVE NAV 31 (CATHETERS) IMPLANT
CATH WEB BIDIR CS D-F NONAUTO (CATHETERS) IMPLANT
CLOSURE MYNX CONTROL 6F/7F (Vascular Products) IMPLANT
CLOSURE PERCLOSE PROSTYLE (Vascular Products) IMPLANT
COVER SWIFTLINK CONNECTOR (BAG) ×1 IMPLANT
DILATOR VESSEL 38 20CM 16FR (INTRODUCER) IMPLANT
GUIDEWIRE INQWIRE 1.5J.035X260 (WIRE) IMPLANT
KIT PATCH RHYTHMIA HDX (MISCELLANEOUS) IMPLANT
KIT VERSACROSS CNCT FARADRIVE (KITS) IMPLANT
PACK EP LF (CUSTOM PROCEDURE TRAY) ×1 IMPLANT
PAD DEFIB RADIO PHYSIO CONN (PAD) ×1 IMPLANT
SHEATH FARADRIVE STEERABLE (SHEATH) IMPLANT
SHEATH PINNACLE 8F 10CM (SHEATH) IMPLANT
SHEATH PINNACLE 9F 10CM (SHEATH) IMPLANT

## 2024-05-08 NOTE — Anesthesia Preprocedure Evaluation (Addendum)
 Anesthesia Evaluation  Patient identified by MRN, date of birth, ID band Patient awake    Reviewed: Allergy & Precautions, NPO status , Patient's Chart, lab work & pertinent test results  History of Anesthesia Complications Negative for: history of anesthetic complications  Airway Mallampati: II  TM Distance: >3 FB Neck ROM: Full    Dental  (+) Dental Advisory Given, Teeth Intact   Pulmonary asthma , sleep apnea    Pulmonary exam normal        Cardiovascular Normal cardiovascular exam+ dysrhythmias Atrial Fibrillation      Neuro/Psych  PSYCHIATRIC DISORDERS Anxiety Depression    negative neurological ROS     GI/Hepatic negative GI ROS, Neg liver ROS,,,  Endo/Other  negative endocrine ROS    Renal/GU negative Renal ROS  Female GU complaint     Musculoskeletal  (+) Arthritis ,    Abdominal   Peds  Hematology  On eliquis     Anesthesia Other Findings HSV  Reproductive/Obstetrics                              Anesthesia Physical Anesthesia Plan  ASA: 3  Anesthesia Plan: General   Post-op Pain Management: Tylenol PO (pre-op)* and Minimal or no pain anticipated   Induction: Intravenous  PONV Risk Score and Plan: 3 and Treatment may vary due to age or medical condition, Ondansetron, Dexamethasone  and Midazolam  Airway Management Planned: Oral ETT  Additional Equipment: None  Intra-op Plan:   Post-operative Plan: Extubation in OR  Informed Consent: I have reviewed the patients History and Physical, chart, labs and discussed the procedure including the risks, benefits and alternatives for the proposed anesthesia with the patient or authorized representative who has indicated his/her understanding and acceptance.     Dental advisory given  Plan Discussed with: CRNA and Anesthesiologist  Anesthesia Plan Comments:          Anesthesia Quick Evaluation

## 2024-05-08 NOTE — Discharge Instructions (Signed)

## 2024-05-08 NOTE — Anesthesia Postprocedure Evaluation (Signed)
 Anesthesia Post Note  Patient: Debra Hardin  Procedure(s) Performed: ATRIAL FIBRILLATION ABLATION     Patient location during evaluation: PACU Anesthesia Type: General Level of consciousness: awake and alert Pain management: pain level controlled Vital Signs Assessment: post-procedure vital signs reviewed and stable Respiratory status: spontaneous breathing, nonlabored ventilation and respiratory function stable Cardiovascular status: stable, blood pressure returned to baseline and bradycardic Anesthetic complications: no   There were no known notable events for this encounter.  Last Vitals:  Vitals:   05/08/24 0940 05/08/24 0945  BP: 132/72 (!) 141/73  Pulse: (!) 56 (!) 54  Resp: 20 17  Temp: (!) 36.4 C   SpO2: 98% 98%    Last Pain:  Vitals:   05/08/24 1005  TempSrc:   PainSc: 0-No pain   Pain Goal:                   [name] Like

## 2024-05-08 NOTE — Anesthesia Procedure Notes (Signed)
 Procedure Name: Intubation Date/Time: 05/08/2024 7:48 AM  Performed by: Hedy Jarred, CRNAPre-anesthesia Checklist: Patient identified, Emergency Drugs available, Suction available, Patient being monitored and Timeout performed Patient Re-evaluated:Patient Re-evaluated prior to induction Oxygen Delivery Method: Circle system utilized Preoxygenation: Pre-oxygenation with 100% oxygen Induction Type: IV induction Ventilation: Mask ventilation without difficulty Laryngoscope Size: Glidescope and 3 Grade View: Grade I Tube type: Subglottic suction tube Tube size: 6.5 mm Number of attempts: 1 Airway Equipment and Method: Stylet and Video-laryngoscopy Placement Confirmation: ETT inserted through vocal cords under direct vision, breath sounds checked- equal and bilateral and CO2 detector Secured at: 22 cm Tube secured with: Tape Dental Injury: Teeth and Oropharynx as per pre-operative assessment

## 2024-05-08 NOTE — H&P (Signed)
  Electrophysiology Office Note:   Date:  05/08/2024  ID:  Debra Hardin, DOB [birth year], MRN [id number]  Primary Cardiologist: None Primary Heart Failure: None Electrophysiologist: Jaken Fregia Gladis Norton, MD      History of Present Illness:   Debra Hardin is a 63 y.o. female with h/o thyroid  nodules, sleep apnea, hyperlipidemia, atrial fibrillation seen today for routine electrophysiology followup.   She has previously been maintained on Multaq  for rhythm control.  She presented to A-fib clinic 01/30/2024 in atrial fibrillation.  She had symptoms of palpitations and fatigue.  She has post cardioversion 02/21/2024.  Today, denies symptoms of palpitations, chest pain, dyspnea, orthopnea, PND, lower extremity edema, claudication, dizziness, presyncope, syncope, bleeding, or neurologic sequela. The patient is tolerating medications without difficulties. Plan ablation today.   EP Information / Studies Reviewed:    EKG is ordered today. Personal review as below.     Risk Assessment/Calculations:    CHA2DS2-VASc Score = 1   This indicates a 0.6% annual risk of stroke. The patient's score is based upon: CHF History: 0 HTN History: 0 Diabetes History: 0 Stroke History: 0 Vascular Disease History: 0 Age Score: 0 Gender Score: 1             Physical Exam:   VS:  BP 118/71   Pulse (!) 57   Temp 98 F (36.7 C) (Oral)   Resp 17   Ht 5' 6 (1.676 m)   Wt 82.6 kg   SpO2 97%   BMI 29.38 kg/m    Wt Readings from Last 3 Encounters:  05/08/24 82.6 kg  03/06/24 84.4 kg  02/21/24 82.2 kg    GEN: Well nourished, well developed in no acute distress NECK: No JVD; No carotid bruits CARDIAC: Regular rate and rhythm, no murmurs, rubs, gallops RESPIRATORY:  Clear to auscultation without rales, wheezing or rhonchi  ABDOMEN: Soft, non-tender, non-distended EXTREMITIES:  No edema; No deformity    ASSESSMENT AND PLAN:    1.  Persistent atrial fibrillation: Debra Hardin has presented today for  surgery, with the diagnosis of AF.  The various methods of treatment have been discussed with the patient and family. After consideration of risks, benefits and other options for treatment, the patient has consented to  Procedure(s): Catheter ablation as a surgical intervention .  Risks include but not limited to complete heart block, stroke, esophageal damage, nerve damage, bleeding, vascular damage, tamponade, perforation, MI, and death. The patient's history has been reviewed, patient examined, no change in status, stable for surgery.  I have reviewed the patient's chart and labs.  Questions were answered to the patient's satisfaction.    Sasha Rueth Norton, MD 05/08/2024 7:04 AM

## 2024-05-08 NOTE — Transfer of Care (Signed)
 Immediate Anesthesia Transfer of Care Note  Patient: Debra Hardin  Procedure(s) Performed: ATRIAL FIBRILLATION ABLATION  Patient Location: Cath Lab  Anesthesia Type:General  Level of Consciousness: sedated  Airway & Oxygen Therapy: Patient Spontanous Breathing and Patient connected to face mask oxygen  Post-op Assessment: Report given to RN and Post -op Vital signs reviewed and stable  Post vital signs: Reviewed and stable  Last Vitals:  Vitals Value Taken Time  BP 108/68 05/08/24 09:07  Temp 98   Pulse 57 05/08/24 09:11  Resp 21 05/08/24 09:11  SpO2 100 % 05/08/24 09:11  Vitals shown include unfiled device data.  Last Pain:  Vitals:   05/08/24 0604  TempSrc: Oral  PainSc:          Complications: There were no known notable events for this encounter.

## 2024-05-09 ENCOUNTER — Encounter (HOSPITAL_COMMUNITY): Payer: Self-pay | Admitting: Cardiology

## 2024-05-09 ENCOUNTER — Telehealth (HOSPITAL_COMMUNITY): Payer: Self-pay

## 2024-05-09 MED FILL — Fentanyl Citrate Preservative Free (PF) Inj 100 MCG/2ML: INTRAMUSCULAR | Qty: 2 | Status: AC

## 2024-05-09 MED FILL — Atropine Sulfate Soln Prefill Syr 1 MG/10ML (0.1 MG/ML): INTRAMUSCULAR | Qty: 10 | Status: AC

## 2024-05-09 NOTE — Telephone Encounter (Signed)
 Spoke with patient to complete post procedure follow up call.  Patient reports no complications with groin sites. She did experience facial flushing and noticed very mild swelling in ankles. Advised patient the flushing could be a side effect from general anesthesia and should be temporary. Mild ankle swelling could be contributed to IV fluid intake. She will continue to monitor both and recommended to keep legs elevated, especially when sitting.   Instructions reviewed with patient:  Remove large bandage at puncture site after 24 hours. It is normal to have bruising, tenderness, mild swelling, and a pea or marble sized lump/knot at the groin site which can take up to three months to resolve.  Get help right away if you notice sudden swelling at the puncture site.  Check your puncture site every day for signs of infection: fever, redness, swelling, pus drainage, warmth, foul odor or excessive pain. If this occurs, please call [phone], to speak with the RN Navigator. Get help right away if your puncture site is bleeding and the bleeding does not stop after applying firm pressure to the area.  You may continue to have skipped beats/ atrial fibrillation during the first several months after your procedure.  It is very important not to miss any doses of your blood thinner Eliquis .    You will follow up with the Afib clinic 4 weeks after your procedure and follow up with the Afib clinic 3 months after your procedure.  Activity restrictions reviewed.  Patient verbalized understanding to all instructions provided and reports she received excellent care and.

## 2024-05-14 ENCOUNTER — Encounter: Payer: Self-pay | Admitting: Cardiology

## 2024-05-14 ENCOUNTER — Ambulatory Visit (HOSPITAL_COMMUNITY)
Admission: RE | Admit: 2024-05-14 | Discharge: 2024-05-14 | Disposition: A | Discharge status: Home / Self Care | Source: Ambulatory Visit | Attending: Physician Assistant | Admitting: Physician Assistant

## 2024-05-14 DIAGNOSIS — I4891 Unspecified atrial fibrillation: Secondary | ICD-10-CM

## 2024-05-14 NOTE — Progress Notes (Signed)
 Patient presents for groin check following afib ablation on 05/08/24. She noted swelling on her left groin site that was tender. No bruising or bleeding. On exam today, a nickel sized tender mass was appreciated, consistent with a small hematoma. It was not pulsatile. Patient reassured and groin precautions reinforced. Follow up in AF clinic as scheduled.

## 2024-05-14 NOTE — Telephone Encounter (Signed)
 Spoke with patient regarding concerns of groin swelling post ablation last week. Pt to come in for groin assessment this afternoon. Pt appreciative of appt.

## 2024-05-15 DIAGNOSIS — F411 Generalized anxiety disorder: Secondary | ICD-10-CM | POA: Diagnosis not present

## 2024-05-25 ENCOUNTER — Ambulatory Visit: Admitting: Cardiology

## 2024-06-04 DIAGNOSIS — Z1231 Encounter for screening mammogram for malignant neoplasm of breast: Secondary | ICD-10-CM | POA: Diagnosis not present

## 2024-06-05 ENCOUNTER — Encounter (HOSPITAL_COMMUNITY): Payer: Self-pay | Admitting: Physician Assistant

## 2024-06-05 ENCOUNTER — Ambulatory Visit (HOSPITAL_COMMUNITY)
Admission: RE | Admit: 2024-06-05 | Discharge: 2024-06-05 | Disposition: A | Source: Ambulatory Visit | Attending: Physician Assistant | Admitting: Physician Assistant

## 2024-06-05 VITALS — BP 116/70 | HR 72 | Ht 66.0 in | Wt 189.8 lb

## 2024-06-05 DIAGNOSIS — I480 Paroxysmal atrial fibrillation: Secondary | ICD-10-CM

## 2024-06-05 DIAGNOSIS — I4819 Other persistent atrial fibrillation: Secondary | ICD-10-CM | POA: Diagnosis not present

## 2024-06-05 NOTE — Progress Notes (Signed)
 Primary Care Physician: Elliot Charm, MD Primary Cardiologist: None Electrophysiologist: Will Gladis Norton, MD  Referring Physician: Dr Kelsie Mliss Debra Hardin is a 63 y.o. female with a history of thyroid  nodules, OSA, HLD, atrial fibrillation who presents for follow up in the [facility].  The patient had been maintained on Multaq  for rhythm control. She was seen for persistent afib and underwent DCCV on 02/21/24. Unfortunately, she had quick return of afib. She was seen by Dr Norton and transitioned to amiodarone  as a bridge to ablation. She is s/p afib ablation 05/08/24.  Patient returns for follow up for atrial fibrillation and amiodarone  monitoring. She remains in SR today. Her Crist mobile has shown only SR. Her groin sites have healed well. No bleeding issues on anticoagulation. She has noted increased fatigue since being on amiodarone .   Today, she  denies symptoms of palpitations, chest pain, shortness of breath, orthopnea, PND, lower extremity edema, dizziness, presyncope, syncope, bleeding, or neurologic sequela. The patient is tolerating medications without difficulties and is otherwise without complaint today.    Atrial Fibrillation Risk Factors:  she does have symptoms or diagnosis of sleep apnea. she does not have a history of rheumatic fever. she does not have a history of alcohol use.   Atrial Fibrillation Management history:  Previous antiarrhythmic drugs: Multaq , amiodarone   Previous cardioversions: 2019 x 2, 02/21/24 Previous ablations: 05/08/24 Anticoagulation history: Eliquis   ROS- All systems are reviewed and negative except as per the HPI above.  Past Medical History:  Diagnosis Date   Anxiety    Dr. Keneth Braden   Asthma    Bronchitis    Depression    HSV-2 (herpes simplex virus 2) infection    Hyperlipidemia    Insomnia    Lateral epicondylitis right; 09/2014   Dr. Ludie Littler   Multiple thyroid  nodules  03/2014   more prominent on L>R; h/o benign bx. f/u 1 yr   OSA (obstructive sleep apnea)    CPAP (uses sporadically)   Trochanteric bursitis 01/2015   Vaginal dryness     Current Outpatient Medications  Medication Sig Dispense Refill   albuterol  (PROVENTIL  HFA;VENTOLIN  HFA) 108 (90 BASE) MCG/ACT inhaler Inhale 2 puffs into the lungs every 6 (six) hours as needed for wheezing or shortness of breath. 1 Inhaler 1   apixaban  (ELIQUIS ) 5 MG TABS tablet Take 1 tablet (5 mg total) by mouth 2 (two) times daily. 180 tablet 2   cetirizine (ZYRTEC) 10 MG tablet Take 10 mg by mouth at bedtime.      diltiazem  (CARDIZEM ) 30 MG tablet Take 1 tablet every 4 hours AS NEEDED for heart rate >100 as long as top blood pressure >100. 45 tablet 1   estradiol  (VIVELLE -DOT) 0.05 MG/24HR patch PLACE ONTO SKIN AND CHANGE TWICE A WEEK (Patient taking differently: Place 1 patch onto the skin 2 (two) times a week.) 8 patch 4   fluticasone (FLONASE) 50 MCG/ACT nasal spray Place 2 sprays into both nostrils daily.     Latanoprostene Bunod (VYZULTA) 0.024 % SOLN Place 1 drop into both eyes at bedtime.     montelukast  (SINGULAIR ) 10 MG tablet Take 10 mg by mouth at bedtime.     traZODone  (DESYREL ) 50 MG tablet Take 50 mg by mouth at bedtime.     valACYclovir  (VALTREX ) 500 MG tablet Take one daily 90 tablet 1   No current facility-administered medications for this encounter.    Physical Exam: BP 116/70   Pulse  72   Ht 5' 6 (1.676 m)   Wt 86.1 kg   BMI 30.63 kg/m   GEN: Well nourished, well developed in no acute distress CARDIAC: Regular rate and rhythm, no murmurs, rubs, gallops RESPIRATORY:  Clear to auscultation without rales, wheezing or rhonchi  ABDOMEN: Soft, non-tender, non-distended EXTREMITIES:  No edema; No deformity    Wt Readings from Last 3 Encounters:  06/05/24 86.1 kg  05/08/24 82.6 kg  03/06/24 84.4 kg     EKG Interpretation Date/Time:  Tuesday June 05 2024 10:44:08 EST Ventricular Rate:   72 PR Interval:  224 QRS Duration:  82 QT Interval:  410 QTC Calculation: 448 R Axis:   -37  Text Interpretation: Sinus rhythm with 1st degree A-V block Left axis deviation Possible Anterior infarct , age undetermined Abnormal ECG When compared with ECG of 08-May-2024 09:28, No significant change was found Confirmed by Nero Sawatzky ([id number]) on 06/05/2024 10:59:50 AM    Echo 01/27/18 demonstrated  - Procedure narrative: Transthoracic echocardiography. Image    quality was adequate. The study was technically difficult.  - Left ventricle: The cavity size was normal. Systolic function was    normal. The estimated ejection fraction was in the range of 60%    to 65%. Wall motion was normal; there were no regional wall    motion abnormalities. The study is not technically sufficient to    allow evaluation of LV diastolic function.  - Right atrium: The atrium was mildly dilated.  - Atrial septum: Atrial septal aneurysm with marked bowing of the    mid and superior aspect of the septum to the right. No obvious    PFO    Bubble study not done.    CHA2DS2-VASc Score = 1  The patient's score is based upon: CHF History: 0 HTN History: 0 Diabetes History: 0 Stroke History: 0 Vascular Disease History: 0 Age Score: 0 Gender Score: 1       ASSESSMENT AND PLAN: Persistent Atrial Fibrillation (ICD10:  I48.19) The patient's CHA2DS2-VASc score is 1, indicating a 0.6% annual risk of stroke.   S/p afib ablation 05/08/24 Patient appears to be maintaining SR Will stop amiodarone  today to see if fatigue improves.  Continue Eliquis  5 mg BID with no missed doses for 3 months post ablation.  Continue diltiazem  30 mg PRN q 4 hours for heart racing.     Follow up in the AF clinic in 2 months.    [facility] [address] [place], [place] [address] [phone]

## 2024-06-13 DIAGNOSIS — U071 COVID-19: Secondary | ICD-10-CM | POA: Diagnosis not present

## 2024-06-13 DIAGNOSIS — I4819 Other persistent atrial fibrillation: Secondary | ICD-10-CM | POA: Diagnosis not present

## 2024-06-13 DIAGNOSIS — J40 Bronchitis, not specified as acute or chronic: Secondary | ICD-10-CM | POA: Diagnosis not present

## 2024-07-16 ENCOUNTER — Ambulatory Visit (INDEPENDENT_AMBULATORY_CARE_PROVIDER_SITE_OTHER): Admitting: Pulmonary Disease

## 2024-07-16 ENCOUNTER — Encounter (HOSPITAL_BASED_OUTPATIENT_CLINIC_OR_DEPARTMENT_OTHER): Payer: Self-pay | Admitting: Pulmonary Disease

## 2024-07-16 VITALS — BP 116/68 | HR 77 | Temp 98.6°F | Ht 66.0 in | Wt 193.5 lb

## 2024-07-16 DIAGNOSIS — G4733 Obstructive sleep apnea (adult) (pediatric): Secondary | ICD-10-CM | POA: Diagnosis not present

## 2024-07-16 NOTE — Progress Notes (Signed)
 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  History of Present Illness Debra Hardin is a 64 year old female with sleep apnea and atrial fibrillation who presents for evaluation of worsening snoring and sleep disturbances. She was referred by Dr. Robbie for evaluation of her sleep issues.  She was diagnosed with sleep apnea about 15 to 20 years ago. She has had repeated difficulty tolerating CPAP despite multiple attempts, including trying different masks. Her apnea is worse when she sleeps on her back. She tried positional therapies (vest with pillow, tennis ball shirt) but could not tolerate them. She takes trazodone  for insomnia.  She was diagnosed with atrial fibrillation in 2019 and had an ablation in April 2025. She is on Eliquis . Her snoring has worsened and others now complain regularly. She has gained about 10 pounds, including 5 pounds in the last month, which she feels has worsened her symptoms.  She has frequent nighttime awakenings and gets up to urinate two to three times per night. She does not feel rested on waking. She has increased anxiety related to personal stressors, which worsens her ability to fall back asleep. Her usual sleep schedule is 9:00 to 9:30 PM to 6:00 to 6:30 AM, but she often wakes earlier and then has difficulty returning to sleep.  She has asthma that is well controlled with Singulair  and Zyrtec. She uses Flonase as needed for seasonal allergies and rarely needs albuterol . She has sinus pressure and dry mouth and uses mouth tape  at night, but she continues to snore.  She has tried multiple CPAP masks and finds them uncomfortable and disruptive to sleep. Her current CPAP machine is from 2012 and she has not used it recently because of poor tolerance.     ROS  Constitutional: negative for anorexia, fevers and sweats  Eyes: negative for irritation, redness and visual disturbance  Ears, nose, mouth, throat, and face: negative for earaches, epistaxis, nasal congestion and sore throat  Respiratory: negative for cough, dyspnea on exertion, sputum and wheezing  Cardiovascular: negative for chest pain, dyspnea, lower extremity edema, orthopnea, palpitations and syncope  Gastrointestinal: negative for abdominal pain, constipation, diarrhea, melena, nausea and vomiting  Genitourinary:negative for dysuria, frequency and hematuria  Hematologic/lymphatic: negative for bleeding, easy bruising and lymphadenopathy  Musculoskeletal:negative for arthralgias, muscle weakness and stiff joints  Neurological: negative for coordination problems, gait problems, headaches and weakness  Endocrine: negative for diabetic symptoms including polydipsia, polyuria and weight loss   Allergies: Codeine and Venlafaxine hcl Current Medications[1] Past Medical History:  Diagnosis Date   Anxiety    Dr. Keneth Braden   Asthma    Bronchitis    Depression    HSV-2 (herpes simplex virus 2) infection    Hyperlipidemia    Insomnia    Lateral epicondylitis right; 09/2014   Dr. Ludie Littler   Multiple thyroid  nodules 03/2014   more prominent on L>R; h/o benign bx. f/u 1 yr   OSA (obstructive sleep apnea)    CPAP (uses sporadically)  Trochanteric bursitis 01/2015   Vaginal dryness    Past Surgical History:  Procedure Laterality Date   ATRIAL FIBRILLATION ABLATION N/A 05/08/2024   Procedure: ATRIAL FIBRILLATION ABLATION;  Surgeon: Inocencio Soyla Lunger, MD;  Location: [facility] INVASIVE CV LAB;  Service: Cardiovascular;  Laterality: N/A;   CARDIOVERSION  N/A 02/21/2018   Procedure: CARDIOVERSION;  Surgeon: Shlomo Wilbert SAUNDERS, MD;  Location: [facility] ENDOSCOPY;  Service: Cardiovascular;  Laterality: N/A;   CARDIOVERSION N/A 03/08/2018   Procedure: CARDIOVERSION;  Surgeon: Lonni Slain, MD;  Location: [facility] ENDOSCOPY;  Service: Cardiovascular;  Laterality: N/A;   CARDIOVERSION N/A 02/21/2024   Procedure: CARDIOVERSION;  Surgeon: Mona Vinie BROCKS, MD;  Location: [facility] INVASIVE CV LAB;  Service: Cardiovascular;  Laterality: N/A;   COMBINED HYSTERECTOMY VAGINAL / OOPHORECTOMY / A&P REPAIR  2008   pt states she has her ovaries   CRYOTHERAPY     DILATION AND CURETTAGE OF UTERUS     HERNIA REPAIR  age 71   LIH   HYSTEROTOMY     KNEE SURGERY Right    twice--Dr. Melita, Dr. Gaspar (25 years ago)   Family History  Problem Relation Age of Onset   Bone cancer Paternal Grandmother    Stroke Maternal Grandmother    Heart attack Maternal Grandfather    Stomach cancer Maternal Grandfather    Heart attack Father 81       heart attacks at 44 and 9   Alcohol abuse Father    Hyperlipidemia Father    Heart disease Mother    Depression Mother    Colon cancer Maternal Uncle    Depression Sister    Alcohol abuse Brother    Hyperlipidemia Brother    Anxiety disorder Brother    Mental illness Daughter    Alcohol abuse Sister    Hyperlipidemia Sister    Depression Sister    Anxiety disorder Sister    Diabetes Maternal Uncle    Social History   Socioeconomic History   Marital status: Married    Spouse name: Not on file   Number of children: Not on file   Years of education: Not on file   Highest education level: Not on file  Occupational History   Not on file  Tobacco Use   Smoking status: Never   Smokeless tobacco: Never   Tobacco comments:    Never smoked 01/30/24  Vaping Use   Vaping status: Never Used  Substance and Sexual Activity   Alcohol use: Yes    Alcohol/week: 1.0 - 2.0 standard drink of alcohol    Types: 1 - 2 Glasses of wine per  week    Comment: occasional (up to 4/week, not every week)   Drug use: No   Sexual activity: Yes    Birth control/protection: Surgical    Comment: hysterectomy   Other Topics Concern   Not on file  Social History Narrative   Married.  2 daughters (one of whom has 2 rats).  Oldest is in college.   She works as a veterinary surgeon at [employer]   Tobacco Use: Low Risk (07/16/2024)   Patient History    Smoking Tobacco Use: Never    Smokeless Tobacco Use: Never    Passive Exposure: Not on file  Financial Resource Strain: Not on file  Food Insecurity: Not on file  Transportation Needs: Not on file  Physical Activity: Not on file  Stress: Not on file  Social Connections: Not on file  Intimate Partner Violence:  Not on file  Depression (PHQ2-9): Not on file  Alcohol Screen: Not on file  Housing: Not on file  Utilities: Not on file  Health Literacy: Not on file       Objective:  BP 116/68 (BP Location: Left Arm, Patient Position: Sitting, Cuff Size: Large)   Pulse 77   Temp 98.6 F (37 C)   Ht 5' 6 (1.676 m)   Wt 193 lb 8 oz (87.8 kg)   SpO2 99%   BMI 31.23 kg/m    Physical Exam  Gen. Pleasant, well-nourished, in no distress ENT - 1mm overbite, no pallor/icterus,no post nasal drip Neck: No JVD, no thyromegaly, no carotid bruits Lungs: no use of accessory muscles, no dullness to percussion, clear without rales or rhonchi  Cardiovascular: Rhythm regular, heart sounds  normal, no murmurs or gallops, no peripheral edema Musculoskeletal: No deformities, no cyanosis or clubbing        Assessment & Plan:  Assessment and Plan Assessment & Plan Obstructive sleep apnea Long-standing obstructive sleep apnea, initially diagnosed 15-20 years ago, primarily positional. Previous CPAP and dental appliance trials were unsuccessful due to discomfort and alignment issues. Recent weight gain and increased snoring suggest possible worsening. Current symptoms  include non-restorative sleep, frequent nocturnal awakenings, and morning headaches. Differential includes positional sleep apnea and potential exacerbation due to weight gain. Previous studies indicated mild to moderate severity, but current status is unclear without recent testing. Discussed potential treatment options including CPAP, Inspire implant, and no treatment if severity is low. CPAP is effective for both mild and moderate sleep apnea and can address snoring. Inspire implant is considered if sleep apnea is moderate and meets specific criteria. She is willing to retry CPAP with updated equipment. - Ordered home sleep study to assess current severity of sleep apnea. - Obtained previous sleep studies from Dr. Johanna office for comparison. - Prescribed [employer] nasal pillows for CPAP trial. - Advised on adjusting CPAP humidity settings to prevent water accumulation. - Will schedule follow-up appointment in 4-6 weeks to review sleep study results and CPAP trial outcomes.       No follow-ups on file.   Harden ROCKFORD Jude, MD     [1]  Current Outpatient Medications:    albuterol  (PROVENTIL  HFA;VENTOLIN  HFA) 108 (90 BASE) MCG/ACT inhaler, Inhale 2 puffs into the lungs every 6 (six) hours as needed for wheezing or shortness of breath., Disp: 1 Inhaler, Rfl: 1   apixaban  (ELIQUIS ) 5 MG TABS tablet, Take 1 tablet (5 mg total) by mouth 2 (two) times daily., Disp: 180 tablet, Rfl: 2   cetirizine (ZYRTEC) 10 MG tablet, Take 10 mg by mouth at bedtime. , Disp: , Rfl:    diltiazem  (CARDIZEM ) 30 MG tablet, Take 1 tablet every 4 hours AS NEEDED for heart rate >100 as long as top blood pressure >100., Disp: 45 tablet, Rfl: 1   estradiol  (VIVELLE -DOT) 0.05 MG/24HR patch, PLACE ONTO SKIN AND CHANGE TWICE A WEEK, Disp: 8 patch, Rfl: 4   fluticasone (FLONASE) 50 MCG/ACT nasal spray, Place 2 sprays into both nostrils daily., Disp: , Rfl:    Latanoprostene Bunod (VYZULTA) 0.024 % SOLN, Place 1  drop into both eyes at bedtime., Disp: , Rfl:    montelukast  (SINGULAIR ) 10 MG tablet, Take 10 mg by mouth at bedtime., Disp: , Rfl:    traZODone  (DESYREL ) 50 MG tablet, Take 50 mg by mouth at bedtime., Disp: , Rfl:    valACYclovir  (VALTREX ) 500 MG tablet, Take one daily, Disp: 90  tablet, Rfl: 1  "

## 2024-07-16 NOTE — Patient Instructions (Signed)
 X obtain sleep studies from [facility]/ Dr Micky  Trial of [employer] Resp[employer]ics nasal pillows AutoCPAP settings Lower humidity to 3 or lower    VISIT SUMMARY: Today, we discussed your worsening snoring and sleep disturbances. You have a history of sleep apnea and atrial fibrillation, and you have experienced difficulty tolerating CPAP therapy. We reviewed your symptoms, including frequent nighttime awakenings, difficulty returning to sleep, and feeling unrested in the morning. We also discussed your recent weight gain and its potential impact on your sleep apnea.  YOUR PLAN: -OBSTRUCTIVE SLEEP APNEA: Obstructive sleep apnea is a condition where your airway becomes blocked during sleep, causing breathing interruptions. We discussed that your sleep apnea may have worsened due to recent weight gain. We will conduct a home sleep study to assess the current severity of your sleep apnea. We have also prescribed [facility] nasal pillows for you to retry CPAP therapy with updated equipment. Adjust the CPAP humidity settings to prevent water accumulation. We will review the results of your sleep study and CPAP trial in a follow-up appointment in 4-6 weeks.  INSTRUCTIONS: Please complete the home sleep study as ordered and retry the CPAP therapy with the prescribed nasal pillows. Adjust the CPAP humidity settings as advised. We will review your progress and the results of the sleep study in 4-6 weeks.                      Contains text generated by [employer].                                 Contains text generated by [employer].

## 2024-07-20 ENCOUNTER — Ambulatory Visit

## 2024-07-20 VITALS — BP 124/51 | Ht 66.0 in | Wt 185.0 lb

## 2024-07-20 DIAGNOSIS — M7918 Myalgia, other site: Secondary | ICD-10-CM

## 2024-07-20 DIAGNOSIS — G2589 Other specified extrapyramidal and movement disorders: Secondary | ICD-10-CM

## 2024-07-20 DIAGNOSIS — M25512 Pain in left shoulder: Secondary | ICD-10-CM | POA: Diagnosis not present

## 2024-07-20 DIAGNOSIS — M62830 Muscle spasm of back: Secondary | ICD-10-CM

## 2024-07-20 MED ORDER — METHYLPREDNISOLONE ACETATE 40 MG/ML IJ SUSP
40.0000 mg | Freq: Once | INTRAMUSCULAR | Status: AC
  Administered 2024-07-20: 40 mg via INTRA_ARTICULAR

## 2024-07-23 NOTE — Patient Instructions (Signed)
 Today you received an injection with a corticosteroid (aka: cortisone injection/trigger point injection). This injection is usually done in response to pain and inflammation. There is some numbing medicine (Lidocaine ) in the shot, so the injected area may be numb and feel really good for the next couple of hours. The numbing medicine usually wears off in 2-3 hours, and then your pain level may be back to where it was before the injection until the cortisone starts working.    The actually benefit from the steroid injection is usually noticed within 3-5 days, but may take up to 14 days. You may actually experience a small (as in 10%) INCREASE in pain in the first 24 hours---that is common.  Things to watch out for that you should contact us  or a health care provider urgently would include: 1. Unusual (as in more than 10%) increase in pain 2. New fever > 101.5 3. New swelling or redness of the injected area. 4. Streaking of red lines around the area injected.  Do not hesitate to call or reach out with any questions or concerns.

## 2024-07-23 NOTE — Progress Notes (Signed)
 [name]   PCP: Elliot Charm, MD  CHIEF COMPLAINT: Acute on chronic left scapula and back pain  HPI: Patient is a pleasant 64 y.o. female who presents today for acute on chronic left posterior scapular and back pain that has been ongoing since November of last year.  Denies any specific inciting events that she can recall.  Has seen chiropractors and other massage therapist for treatment.  Unable to get much relief.  Has tried Tylenol , ibuprofen, heat and ice.  Describes pain as being near and behind her left shoulder blade.  Denies any radicular symptoms although occasionally she feels like her ribs are bothered both in back and in front.  Patient having trouble performing activities of daily living pain-free since November.  Here today for further management of care.   PMH:  Past Medical History:  Diagnosis Date   Anxiety    Dr. Keneth Braden   Asthma    Bronchitis    Depression    HSV-2 (herpes simplex virus 2) infection    Hyperlipidemia    Insomnia    Lateral epicondylitis right; 09/2014   Dr. Ludie Littler   Multiple thyroid  nodules 03/2014   more prominent on L>R; h/o benign bx. f/u 1 yr   OSA (obstructive sleep apnea)    CPAP (uses sporadically)   Trochanteric bursitis 01/2015   Vaginal dryness     Patient Active Problem List   Diagnosis Date Noted   Hallux rigidus of both feet 07/10/2021   Right ankle pain 12/03/2020   Primary osteoarthritis of right knee 11/05/2019   Atrial fibrillation (HCC)    Greater trochanteric pain syndrome of left lower extremity 01/26/2018   Muscle weakness of lower extremity 01/26/2018   Left knee pain 06/02/2015   Abnormality of gait 04/29/2015   Lateral epicondylitis of right elbow 03/18/2015   Pain in joint, ankle and foot 03/18/2015   OSA on CPAP 01/30/2015   Right hip pain 01/08/2015   Right lateral epicondylitis 10/31/2014   Hot flushes,  perimenopausal  HT began 11/2013 10/28/2013   Multinodular goiter 07/25/2013   Anxiety 07/25/2013   Asthma 07/25/2013   DJD (degenerative joint disease) 07/25/2013   Depression 07/25/2013   Hyperlipidemia 07/25/2013   S/P hysterectomy 07/25/2013   Genital herpes 07/25/2013   Allergic rhinitis 07/25/2013   Sleep apnea 07/25/2013   Urinary incontinence 07/25/2013   Vertigo 07/25/2013   Left hip pain 07/25/2013    PSurg:  Past Surgical History:  Procedure Laterality Date   ATRIAL FIBRILLATION ABLATION N/A 05/08/2024   Procedure: ATRIAL FIBRILLATION ABLATION;  Surgeon: Inocencio Soyla Lunger, MD;  Location: [facility] INVASIVE CV LAB;  Service: Cardiovascular;  Laterality: N/A;   CARDIOVERSION N/A 02/21/2018   Procedure: CARDIOVERSION;  Surgeon: Shlomo Wilbert SAUNDERS, MD;  Location: [facility] ENDOSCOPY;  Service: Cardiovascular;  Laterality: N/A;   CARDIOVERSION N/A 03/08/2018   Procedure: CARDIOVERSION;  Surgeon: Lonni Slain, MD;  Location: [facility] ENDOSCOPY;  Service: Cardiovascular;  Laterality: N/A;   CARDIOVERSION N/A 02/21/2024   Procedure: CARDIOVERSION;  Surgeon: Mona Vinie BROCKS, MD;  Location: [facility] INVASIVE CV LAB;  Service: Cardiovascular;  Laterality: N/A;   COMBINED HYSTERECTOMY VAGINAL / OOPHORECTOMY / A&P REPAIR  2008   pt states she has her ovaries   CRYOTHERAPY     DILATION AND CURETTAGE OF UTERUS     HERNIA REPAIR  age 67   LIH   HYSTEROTOMY     KNEE SURGERY Right  twice--Dr. Melita, Dr. Gaspar (25 years ago)    Allergies: Codeine and Venlafaxine hcl  Meds:  Previous Medications   ALBUTEROL  (PROVENTIL  HFA;VENTOLIN  HFA) 108 (90 BASE) MCG/ACT INHALER    Inhale 2 puffs into the lungs every 6 (six) hours as needed for wheezing or shortness of breath.   APIXABAN  (ELIQUIS ) 5 MG TABS TABLET    Take 1 tablet (5 mg total) by mouth 2 (two) times daily.   CETIRIZINE (ZYRTEC) 10 MG TABLET    Take 10 mg by mouth at bedtime.    DILTIAZEM  (CARDIZEM ) 30 MG TABLET    Take 1 tablet every 4 hours AS  NEEDED for heart rate >100 as long as top blood pressure >100.   ESTRADIOL  (VIVELLE -DOT) 0.05 MG/24HR PATCH    PLACE ONTO SKIN AND CHANGE TWICE A WEEK   FLUTICASONE (FLONASE) 50 MCG/ACT NASAL SPRAY    Place 2 sprays into both nostrils daily.   LATANOPROSTENE BUNOD (VYZULTA) 0.024 % SOLN    Place 1 drop into both eyes at bedtime.   MONTELUKAST  (SINGULAIR ) 10 MG TABLET    Take 10 mg by mouth at bedtime.   TRAZODONE  (DESYREL ) 50 MG TABLET    Take 50 mg by mouth at bedtime.   VALACYCLOVIR  (VALTREX ) 500 MG TABLET    Take one daily    Social:  Social History   Tobacco Use   Smoking status: Never   Smokeless tobacco: Never   Tobacco comments:    Never smoked 01/30/24  Substance Use Topics   Alcohol use: Yes    Alcohol/week: 1.0 - 2.0 standard drink of alcohol    Types: 1 - 2 Glasses of wine per week    Comment: occasional (up to 4/week, not every week)    REVIEW OF SYSTEMS:  ROS negative except as noted in HPI above   Objective Exam:  Vitals:   07/20/24 1132  BP: (!) 124/51  Weight: 185 lb (83.9 kg)  Height: 5' 6 (1.676 m)    GENERAL: Patient is afebrile, Vital signs reviewed, well appearing, Patient appears comfortable, Alert and lucid. No apparent distress.   Physical Exam   Ortho Exam:  On inspection of patient's back and scapula patient has no erythema, ecchymoses.  Does have tenderness to palpation over superior medial border of left scapula.  Patient has distinct hypertonicity of rhomboid and levator scapular muscles on her left side consistent with acute on chronic muscle spasms.  Patient has full range of motion of her upper extremities but does exhibit some mild scapular dyskinesis on her left side.  Patient strength is 5/5 in both upper extremities.  Neurovascular intact distally in both extremities.  Rotator cuff strength maintained.  RESULTS:  Labs: No results found for this or any previous visit (from the past 48 hours).  Imaging:  No orders to display     Assessment/Plan:  1. Left shoulder pain, unspecified chronicity   2. Scapular dyskinesis   3. Rhomboid muscle pain   4. Left-sided levator scapulae syndrome   5. Muscle spasm of back   - 4 trigger point injections performed today surrounding the superior and medial border of her left scapula at site of chronic muscle spasms..  Patient tolerated procedure well and endorsed mild/moderate improvement of symptoms.  See full procedure note below. - Recommended using 4% lidocaine  patches for additional pain relief - Continuing Tylenol /ibuprofen as needed -Provided home exercise program for scapular retracting and stretching due to chronic rhomboid spasms - Try heat and ice for additional  symptom relief - Patient verbalizes understanding and agrees to this treatment plan.   - Will follow-up in 4 to 6 weeks for reevaluation of symptoms   A trigger point injection was performed at the site of maximal tenderness using 3cc of 1% plain Lidocaine  and 1 cc of Kenalog. This was well tolerated, and followed by immmediate relief of pain.  Monitored patient for 5 minutes after procedure and patient had no side effects from procedure.    New Prescriptions   No medications on file    Medications, medical history, allergies, surgical history, hospitalizations, family history, social history, ROS and vitals entered by nursing staff and reviewed by myself.  I discussed with the patient the diagnosis, treatment plan, indications for return to the emergency department, and for expected follow-up. The patient verbalized an understanding. The patient is asked if there are any questions or concerns. We discuss the case, until all issues are addressed to the patient's satisfaction.  Follow up per instructions including returning for additional office visit if symptoms worsen or proceeding to the emergency department or urgent care in the next 12-24hrs if there is an acute concerning increasing symptoms, pain,  fevers, or other symptoms.  Prentice Agent, DO  8:47 AM, 07/23/2024

## 2024-08-08 ENCOUNTER — Ambulatory Visit (HOSPITAL_COMMUNITY): Admitting: Physician Assistant

## 2024-08-16 ENCOUNTER — Ambulatory Visit

## 2024-08-16 ENCOUNTER — Ambulatory Visit (HOSPITAL_COMMUNITY): Admitting: Physician Assistant

## 2024-08-27 ENCOUNTER — Ambulatory Visit (HOSPITAL_BASED_OUTPATIENT_CLINIC_OR_DEPARTMENT_OTHER): Admitting: Pulmonary Disease
